# Patient Record
Sex: Female | Born: 1960 | Race: Black or African American | Hispanic: No | Marital: Married | Smoking: Never smoker
Health system: Southern US, Community
[De-identification: ages and names within clinical notes are randomized; demographics above are authoritative.]

## PROBLEM LIST (undated history)

## (undated) DIAGNOSIS — C55 Malignant neoplasm of uterus, part unspecified: Secondary | ICD-10-CM

## (undated) DIAGNOSIS — C801 Malignant (primary) neoplasm, unspecified: Secondary | ICD-10-CM

## (undated) DIAGNOSIS — I82409 Acute embolism and thrombosis of unspecified deep veins of unspecified lower extremity: Secondary | ICD-10-CM

## (undated) DIAGNOSIS — N63 Unspecified lump in unspecified breast: Secondary | ICD-10-CM

## (undated) DIAGNOSIS — R222 Localized swelling, mass and lump, trunk: Secondary | ICD-10-CM

## (undated) HISTORY — PX: BREAST BIOPSY: SHX20

## (undated) HISTORY — DX: Malignant (primary) neoplasm, unspecified: C80.1

## (undated) HISTORY — DX: Unspecified lump in unspecified breast: N63.0

## (undated) HISTORY — PX: COLONOSCOPY: SHX174

---

## 1999-12-30 HISTORY — PX: CHOLECYSTECTOMY: SHX55

## 2003-12-30 HISTORY — PX: BACK SURGERY: SHX140

## 2003-12-30 HISTORY — PX: ANKLE SURGERY: SHX546

## 2004-12-13 ENCOUNTER — Ambulatory Visit (HOSPITAL_COMMUNITY): Admission: RE | Admit: 2004-12-13 | Discharge: 2004-12-14 | Payer: Self-pay | Admitting: Neurological Surgery

## 2004-12-29 DIAGNOSIS — N63 Unspecified lump in unspecified breast: Secondary | ICD-10-CM

## 2004-12-29 HISTORY — DX: Unspecified lump in unspecified breast: N63.0

## 2006-02-06 ENCOUNTER — Ambulatory Visit: Payer: Self-pay | Admitting: Family Medicine

## 2006-11-13 ENCOUNTER — Ambulatory Visit: Payer: Self-pay | Admitting: Unknown Physician Specialty

## 2008-02-23 ENCOUNTER — Ambulatory Visit: Payer: Self-pay | Admitting: Unknown Physician Specialty

## 2008-03-07 ENCOUNTER — Ambulatory Visit: Payer: Self-pay | Admitting: Unknown Physician Specialty

## 2009-05-25 ENCOUNTER — Ambulatory Visit: Payer: Self-pay | Admitting: General Surgery

## 2010-05-30 ENCOUNTER — Ambulatory Visit: Payer: Self-pay | Admitting: Unknown Physician Specialty

## 2011-06-03 ENCOUNTER — Ambulatory Visit: Payer: Self-pay | Admitting: Unknown Physician Specialty

## 2012-06-24 ENCOUNTER — Ambulatory Visit: Payer: Self-pay | Admitting: Obstetrics and Gynecology

## 2013-03-24 ENCOUNTER — Encounter: Payer: Self-pay | Admitting: *Deleted

## 2013-03-29 ENCOUNTER — Other Ambulatory Visit: Payer: Self-pay

## 2013-03-29 ENCOUNTER — Ambulatory Visit (INDEPENDENT_AMBULATORY_CARE_PROVIDER_SITE_OTHER): Payer: 59 | Admitting: General Surgery

## 2013-03-29 ENCOUNTER — Encounter: Payer: Self-pay | Admitting: General Surgery

## 2013-03-29 VITALS — BP 122/80 | HR 78 | Resp 12 | Ht 64.0 in | Wt 197.0 lb

## 2013-03-29 DIAGNOSIS — I10 Essential (primary) hypertension: Secondary | ICD-10-CM

## 2013-03-29 DIAGNOSIS — N63 Unspecified lump in unspecified breast: Secondary | ICD-10-CM

## 2013-03-29 DIAGNOSIS — N6002 Solitary cyst of left breast: Secondary | ICD-10-CM

## 2013-03-29 DIAGNOSIS — N6009 Solitary cyst of unspecified breast: Secondary | ICD-10-CM

## 2013-03-29 NOTE — Progress Notes (Signed)
Patient ID: Cassidy Bradley, female   DOB: 06-Jun-1961, 52 y.o.   MRN: [id number]  Chief Complaint  Patient presents with  . Mass    left breast knot    HPI Cassidy Bradley is a 52 y.o. female here today for a left breast knot noticed in January 2014 . It a little tender to move her  arm.Patient last mammogram was on  06/24/12 cat 2 . Patient have multi family members with breast cancer.Patient had labs on 03/09/13.Patient had biliateral breast biopsy's done in the past. . HPI  Past Medical History  Diagnosis Date  . Breast lump 2006     both breast     Past Surgical History  Procedure Laterality Date  . Cholecystectomy  2001  . Ankle surgery  2005  . Cesarean section  1993  . Back surgery  2005  . Breast biopsy Bilateral   . Colonoscopy      Family History  Problem Relation Age of Onset  . Breast cancer Sister 78  . Breast cancer Maternal Aunt   . Breast cancer Maternal Grandmother 60  . Stomach cancer Maternal Aunt   . Lung cancer Maternal Grandfather     Social History History  Substance Use Topics  . Smoking status: Not on file  . Smokeless tobacco: Never Used  . Alcohol Use: No    Allergies  Allergen Reactions  . Pravastatin Hives  . Sulfa Antibiotics Hives    Current Outpatient Prescriptions  Medication Sig Dispense Refill  . furosemide (LASIX) 20 MG tablet Take 20 mg by mouth as needed.       No current facility-administered medications for this visit.    Review of Systems Review of Systems  Constitutional: Negative.   Respiratory: Negative.   Cardiovascular: Negative.     Blood pressure 122/80, pulse 78, resp. rate 12, height 5\' 4"  (1.626 m), weight 197 lb (89.359 kg).  Physical Exam Physical Exam  Constitutional: She appears well-developed and well-nourished.  Neck: Normal range of motion. Neck supple.  Cardiovascular: Normal rate, regular rhythm and normal heart sounds.   Pulmonary/Chest: Effort normal and breath sounds normal. Right breast  exhibits no inverted nipple, no mass, no nipple discharge, no skin change and no tenderness. Left breast exhibits inverted nipple, mass (2 .5cm mass 4 o'clock 7 cm from the nipple) and tenderness. Left breast exhibits no nipple discharge and no skin change.  Lymphadenopathy:       Right axillary: No pectoral and no lateral adenopathy present.       Left axillary: No pectoral and no lateral adenopathy present.   Data Reviewed Bilateral mammogram dated 06/24/2012 was category 2.  Breast ultrasound showed a large simple cyst at the 4:00 position 7 cm from nipple measuring 2.2 x 2.4 x 3.1 cm. Post aspiration showed complete resolution. The cavity was filled with air to minimize the chance for recurrence. The procedure was well tolerated.  Assessment    Left breast cyst.    Plan    Arrangements were made for followup exam in 6 weeks to confirm the area has not recurred.       Cassidy Bradley 03/30/2013, 5:06 PM

## 2013-03-29 NOTE — Patient Instructions (Addendum)
6 week follow up

## 2013-03-30 ENCOUNTER — Encounter: Payer: Self-pay | Admitting: General Surgery

## 2013-03-30 DIAGNOSIS — I10 Essential (primary) hypertension: Secondary | ICD-10-CM | POA: Insufficient documentation

## 2013-05-16 ENCOUNTER — Ambulatory Visit: Payer: 59 | Admitting: General Surgery

## 2013-06-23 ENCOUNTER — Encounter: Payer: Self-pay | Admitting: General Surgery

## 2013-06-23 ENCOUNTER — Ambulatory Visit (INDEPENDENT_AMBULATORY_CARE_PROVIDER_SITE_OTHER): Payer: 59 | Admitting: General Surgery

## 2013-06-23 VITALS — BP 140/80 | HR 74 | Resp 12 | Ht 64.0 in | Wt 194.0 lb

## 2013-06-23 DIAGNOSIS — N63 Unspecified lump in unspecified breast: Secondary | ICD-10-CM | POA: Insufficient documentation

## 2013-06-23 NOTE — Progress Notes (Signed)
Patient ID: Cassidy Bradley, female   DOB: 1961-07-16, 52 y.o.   MRN: [id number]  Chief Complaint  Patient presents with  . Other    breast cyst    HPI Cassidy Bradley is a 52 y.o. female who presents for a 6 week follow up for left breast cyst. The patient states she is doing well. No complaints at this time. The patient underwent aspiration of a symptomatic cyst with air insufflation. She reports the pain she was experiencing prior to aspiration as resolved and has not recurred. She is no longer were very palpable masses in the breast. HPI  Past Medical History  Diagnosis Date  . Breast lump 2006     both breast     Past Surgical History  Procedure Laterality Date  . Cholecystectomy  2001  . Ankle surgery  2005  . Cesarean section  1993  . Back surgery  2005  . Breast biopsy Bilateral   . Colonoscopy      Family History  Problem Relation Age of Onset  . Breast cancer Sister 7  . Breast cancer Maternal Aunt   . Breast cancer Maternal Grandmother 60  . Stomach cancer Maternal Aunt   . Lung cancer Maternal Grandfather     Social History History  Substance Use Topics  . Smoking status: Never Smoker   . Smokeless tobacco: Never Used  . Alcohol Use: No    Allergies  Allergen Reactions  . Pravastatin Hives  . Sulfa Antibiotics Hives  . Latex Rash    Current Outpatient Prescriptions  Medication Sig Dispense Refill  . furosemide (LASIX) 20 MG tablet Take 20 mg by mouth as needed.       No current facility-administered medications for this visit.    Review of Systems Review of Systems  Constitutional: Negative.   Respiratory: Negative.   Cardiovascular: Negative.     Blood pressure 140/80, pulse 74, resp. rate 12, height 5\' 4"  (1.626 m), weight 194 lb (87.998 kg).  Physical Exam Physical Exam  Constitutional: She appears well-developed and well-nourished.  Pulmonary/Chest: Left breast exhibits no inverted nipple, no mass, no nipple discharge, no skin  change and no tenderness. Breasts are symmetrical.    No focal mass, thickening or tenderness identified in either breast.  Neurological: She is alert.  Skin: Skin is warm and dry.    Data Reviewed No new imaging studies.  Assessment    Benign exam.     Plan    The patient was encouraged to continue annual mammograms with her primary care provider. She was encouraged to call if she develops any recurrent breast lesions or develops any recurrent breast pain.        Earline Mayotte 06/23/2013, 8:33 PM

## 2013-06-23 NOTE — Patient Instructions (Signed)
Patient to return as needed. Patient advised to have PCP to send results of her mammogram to our office.

## 2013-06-28 ENCOUNTER — Ambulatory Visit: Payer: Self-pay | Admitting: Physician Assistant

## 2013-06-29 ENCOUNTER — Telehealth: Payer: Self-pay

## 2013-06-29 NOTE — Telephone Encounter (Signed)
Notified patient of mammogram results and Dr Rutherford Nail advise to follow up with PCP annually. Patient thankful and understands

## 2013-06-29 NOTE — Telephone Encounter (Signed)
Message copied by Cassidy Bradley on Wed Jun 29, 2013 11:35 AM ------      Message from: [place], Merrily Pew      Created: Wed Jun 29, 2013 10:50 AM      Regarding: RE: Mammogram       Please notify the patient and I looked at her mammograms. She has a small cyst in the left breast, nothing like she had before. Otherwise unremarkable. Annual exams with her primary care provider      ----- Message -----         From: Cassidy Du, LPN         Sent: 06/29/2013   8:51 AM           To: Earline Mayotte, MD      Subject: Mammogram                                                Trystyn called today to let you know that she had her mammogram done yesterday at [facility]. You had asked her to call when this was completed so you could review the films.       ------

## 2014-05-08 ENCOUNTER — Ambulatory Visit: Payer: 59 | Admitting: General Surgery

## 2014-06-21 ENCOUNTER — Encounter: Payer: Self-pay | Admitting: General Surgery

## 2014-07-11 ENCOUNTER — Ambulatory Visit: Payer: Self-pay | Admitting: Physician Assistant

## 2014-07-12 ENCOUNTER — Ambulatory Visit: Payer: Self-pay | Admitting: General Surgery

## 2014-08-22 ENCOUNTER — Encounter: Payer: Self-pay | Admitting: General Surgery

## 2014-08-23 ENCOUNTER — Ambulatory Visit (INDEPENDENT_AMBULATORY_CARE_PROVIDER_SITE_OTHER): Payer: 59 | Admitting: General Surgery

## 2014-08-23 ENCOUNTER — Encounter: Payer: Self-pay | Admitting: General Surgery

## 2014-08-23 VITALS — BP 120/78 | HR 76 | Resp 12 | Ht 64.0 in | Wt 184.0 lb

## 2014-08-23 DIAGNOSIS — E7800 Pure hypercholesterolemia, unspecified: Secondary | ICD-10-CM | POA: Insufficient documentation

## 2014-08-23 DIAGNOSIS — N951 Menopausal and female climacteric states: Secondary | ICD-10-CM | POA: Insufficient documentation

## 2014-08-23 DIAGNOSIS — J309 Allergic rhinitis, unspecified: Secondary | ICD-10-CM | POA: Insufficient documentation

## 2014-08-23 DIAGNOSIS — G43909 Migraine, unspecified, not intractable, without status migrainosus: Secondary | ICD-10-CM | POA: Insufficient documentation

## 2014-08-23 DIAGNOSIS — Z1211 Encounter for screening for malignant neoplasm of colon: Secondary | ICD-10-CM

## 2014-08-23 MED ORDER — POLYETHYLENE GLYCOL 3350 17 GM/SCOOP PO POWD: ORAL | Status: DC

## 2014-08-23 NOTE — Patient Instructions (Addendum)
Colonoscopy A colonoscopy is an exam to look at the entire large intestine (colon). This exam can help find problems such as tumors, polyps, inflammation, and areas of bleeding. The exam takes about 1 hour.  LET [facility] CARE PROVIDER KNOW ABOUT:   Any allergies you have.  All medicines you are taking, including vitamins, herbs, eye drops, creams, and over-the-counter medicines.  Previous problems you or members of your family have had with the use of anesthetics.  Any blood disorders you have.  Previous surgeries you have had.  Medical conditions you have. RISKS AND COMPLICATIONS  Generally, this is a safe procedure. However, as with any procedure, complications can occur. Possible complications include:  Bleeding.  Tearing or rupture of the colon wall.  Reaction to medicines given during the exam.  Infection (rare). BEFORE THE PROCEDURE   Ask your health care provider about changing or stopping your regular medicines.  You may be prescribed an oral bowel prep. This involves drinking a large amount of medicated liquid, starting the day before your procedure. The liquid will cause you to have multiple loose stools until your stool is almost clear or light green. This cleans out your colon in preparation for the procedure.  Do not eat or drink anything else once you have started the bowel prep, unless your health care provider tells you it is safe to do so.  Arrange for someone to drive you home after the procedure. PROCEDURE   You will be given medicine to help you relax (sedative).  You will lie on your side with your knees bent.  A long, flexible tube with a light and camera on the end (colonoscope) will be inserted through the rectum and into the colon. The camera sends video back to a computer screen as it moves through the colon. The colonoscope also releases carbon dioxide gas to inflate the colon. This helps your health care provider see the area better.  During  the exam, your health care provider may take a small tissue sample (biopsy) to be examined under a microscope if any abnormalities are found.  The exam is finished when the entire colon has been viewed. AFTER THE PROCEDURE   Do not drive for 24 hours after the exam.  You may have a small amount of blood in your stool.  You may pass moderate amounts of gas and have mild abdominal cramping or bloating. This is caused by the gas used to inflate your colon during the exam.  Ask when your test results will be ready and how you will get your results. Make sure you get your test results. Document Released: 12/12/2000 Document Revised: 10/05/2013 Document Reviewed: 08/22/2013 [facility] Patient Information 2015 [place], [place]. This information is not intended to replace advice given to you by your health care provider. Make sure you discuss any questions you have with your health care provider.  Patient has been scheduled for a colonoscopy on 10-18-14 at [facility].

## 2014-08-23 NOTE — Progress Notes (Signed)
Patient ID: Cassidy Bradley, female   DOB: 1961-03-11, 53 y.o.   MRN: [id number]  Chief Complaint  Patient presents with  . Other    screening colonoscopy    HPI Cassidy Bradley is a 53 y.o. female here today for a screening colonoscopy.Patient last colonoscopy was done in 2005. Patient states no gastrointestional problems at this time. She does admit to occasional trouble with hemorrhoids, but no bleeding. Bowels are regular.  HPI  Past Medical History  Diagnosis Date  . Breast lump 2006     both breast     Past Surgical History  Procedure Laterality Date  . Cholecystectomy  2001  . Ankle surgery  2005  . Cesarean section  1993  . Back surgery  2005  . Breast biopsy Bilateral   . Colonoscopy      Family History  Problem Relation Age of Onset  . Breast cancer Sister 12  . Breast cancer Maternal Aunt   . Breast cancer Maternal Grandmother 60  . Stomach cancer Maternal Aunt   . Lung cancer Maternal Grandfather   . Colon polyps Mother   . Cancer - Colon Cousin 78    first cousin    Social History History  Substance Use Topics  . Smoking status: Never Smoker   . Smokeless tobacco: Never Used  . Alcohol Use: No    Allergies  Allergen Reactions  . Pravastatin Hives and Nausea And Vomiting  . Sulfa Antibiotics Hives  . Latex Rash  . Other Rash    Spandex    Current Outpatient Prescriptions  Medication Sig Dispense Refill  . cholecalciferol (VITAMIN D) 1000 UNITS tablet Take 1,000 Units by mouth daily.      . polyethylene glycol powder (GLYCOLAX/MIRALAX) powder 255 grams one bottle for colonoscopy prep  255 g  0   No current facility-administered medications for this visit.    Review of Systems Review of Systems  Constitutional: Negative.   Respiratory: Negative.   Cardiovascular: Negative.     Blood pressure 120/78, pulse 76, resp. rate 12, height 5\' 4"  (1.626 m), weight 184 lb (83.462 kg).  Physical Exam Physical Exam  Constitutional: She is  oriented to person, place, and time. She appears well-developed and well-nourished.  Neck: Neck supple.  Cardiovascular: Normal rate, regular rhythm and normal heart sounds.   Pulmonary/Chest: Effort normal and breath sounds normal.  Abdominal: Soft. Bowel sounds are normal. There is no tenderness.  Lymphadenopathy:    She has no cervical adenopathy.  Neurological: She is alert and oriented to person, place, and time.  Skin: Skin is warm and dry.    Data Reviewed January 07, 2014 colonoscopy was normal. Exam completed for occult blood in the stool.  Assessment    Candidate for screening colonoscopy.     Plan      Colonoscopy with possible biopsy/polypectomy prn: Information regarding the procedure, including its potential risks and complications (including but not limited to perforation of the bowel, which may require emergency surgery to repair, and bleeding) was verbally given to the patient. Educational information regarding lower instestinal endoscopy was given to the patient. Written instructions for how to complete the bowel prep using Miralax were provided. The importance of drinking ample fluids to avoid dehydration as a result of the prep emphasized.  Patient has been scheduled for a colonoscopy on 10-18-14 at [facility].    PCP: Dagoberto Reef 08/25/2014, 6:42 PM

## 2014-08-25 DIAGNOSIS — Z1211 Encounter for screening for malignant neoplasm of colon: Secondary | ICD-10-CM | POA: Insufficient documentation

## 2014-10-09 ENCOUNTER — Telehealth: Payer: Self-pay | Admitting: *Deleted

## 2014-10-09 NOTE — Telephone Encounter (Signed)
Patient states she has had no medication changes since last office visit. She does have Miralax prescription but has yet to pre-register. Patient encouraged to pre-register by Friday of this week.   We will proceed with colonoscopy as scheduled for 10-18-14 at [facility].   This patient was instructed to call the office should she have further questions.

## 2014-10-15 ENCOUNTER — Other Ambulatory Visit: Payer: Self-pay | Admitting: General Surgery

## 2014-10-15 DIAGNOSIS — Z1211 Encounter for screening for malignant neoplasm of colon: Secondary | ICD-10-CM

## 2014-10-18 ENCOUNTER — Ambulatory Visit: Payer: Self-pay | Admitting: General Surgery

## 2014-10-18 DIAGNOSIS — Z1211 Encounter for screening for malignant neoplasm of colon: Secondary | ICD-10-CM

## 2014-10-19 ENCOUNTER — Encounter: Payer: Self-pay | Admitting: General Surgery

## 2014-12-08 ENCOUNTER — Ambulatory Visit: Payer: Self-pay | Admitting: Physician Assistant

## 2014-12-18 ENCOUNTER — Ambulatory Visit: Payer: Self-pay | Admitting: Physician Assistant

## 2015-01-04 ENCOUNTER — Ambulatory Visit: Payer: Self-pay

## 2015-01-24 ENCOUNTER — Ambulatory Visit: Payer: Self-pay | Admitting: Obstetrics and Gynecology

## 2015-01-29 ENCOUNTER — Ambulatory Visit: Payer: Self-pay | Admitting: Obstetrics and Gynecology

## 2015-01-29 DIAGNOSIS — C55 Malignant neoplasm of uterus, part unspecified: Secondary | ICD-10-CM

## 2015-01-29 HISTORY — DX: Malignant neoplasm of uterus, part unspecified: C55

## 2015-01-30 ENCOUNTER — Inpatient Hospital Stay: Payer: Self-pay | Admitting: Obstetrics and Gynecology

## 2015-01-30 HISTORY — PX: ABDOMINAL HYSTERECTOMY: SHX81

## 2015-01-31 LAB — BASIC METABOLIC PANEL
Anion Gap: 10 (ref 7–16)
BUN: 4 mg/dL — ABNORMAL LOW (ref 7–18)
CALCIUM: 9.1 mg/dL (ref 8.5–10.1)
Chloride: 100 mmol/L (ref 98–107)
Co2: 26 mmol/L (ref 21–32)
Creatinine: 0.62 mg/dL (ref 0.60–1.30)
EGFR (African American): >60
EGFR (Non-African Amer.): >60
Glucose: 143 mg/dL — ABNORMAL HIGH (ref 65–99)
Osmolality: 271 (ref 275–301)
POTASSIUM: 3.3 mmol/L — AB (ref 3.5–5.1)
Sodium: 136 mmol/L (ref 136–145)

## 2015-01-31 LAB — CBC
HCT: 34.8 % — ABNORMAL LOW (ref 35.0–47.0)
HGB: 11.8 g/dL — AB (ref 12.0–16.0)
MCH: 29.2 pg (ref 26.0–34.0)
MCHC: 34 g/dL (ref 32.0–36.0)
MCV: 86 fL (ref 80–100)
Platelet: 227 10*3/uL (ref 150–440)
RBC: 4.05 10*6/uL (ref 3.80–5.20)
RDW: 13.5 % (ref 11.5–14.5)
WBC: 7.6 10*3/uL (ref 3.6–11.0)

## 2015-01-31 LAB — MAGNESIUM: Magnesium: 1.7 mg/dL — ABNORMAL LOW

## 2015-02-01 LAB — BASIC METABOLIC PANEL
Anion Gap: 6 — ABNORMAL LOW (ref 7–16)
BUN: 7 mg/dL (ref 7–18)
CALCIUM: 9.4 mg/dL (ref 8.5–10.1)
CREATININE: 0.74 mg/dL (ref 0.60–1.30)
Chloride: 104 mmol/L (ref 98–107)
Co2: 29 mmol/L (ref 21–32)
EGFR (Non-African Amer.): >60
Glucose: 83 mg/dL (ref 65–99)
Osmolality: 275 (ref 275–301)
POTASSIUM: 3.8 mmol/L (ref 3.5–5.1)
Sodium: 139 mmol/L (ref 136–145)

## 2015-02-01 LAB — CBC
HCT: 34 % — ABNORMAL LOW (ref 35.0–47.0)
HGB: 11.3 g/dL — ABNORMAL LOW (ref 12.0–16.0)
MCH: 28.9 pg (ref 26.0–34.0)
MCHC: 33.2 g/dL (ref 32.0–36.0)
MCV: 87 fL (ref 80–100)
PLATELETS: 212 10*3/uL (ref 150–440)
RBC: 3.9 10*6/uL (ref 3.80–5.20)
RDW: 13.3 % (ref 11.5–14.5)
WBC: 4.7 10*3/uL (ref 3.6–11.0)

## 2015-02-01 LAB — MAGNESIUM: MAGNESIUM: 2 mg/dL

## 2015-02-12 DIAGNOSIS — C7800 Secondary malignant neoplasm of unspecified lung: Secondary | ICD-10-CM | POA: Insufficient documentation

## 2015-02-19 ENCOUNTER — Emergency Department: Payer: Self-pay | Admitting: Emergency Medicine

## 2015-02-21 ENCOUNTER — Ambulatory Visit: Payer: Self-pay | Admitting: Obstetrics and Gynecology

## 2015-02-27 ENCOUNTER — Ambulatory Visit
Admit: 2015-02-27 | Disposition: A | Discharge status: Home / Self Care | Payer: Self-pay | Attending: Hematology and Oncology | Admitting: Hematology and Oncology

## 2015-03-02 ENCOUNTER — Ambulatory Visit (INDEPENDENT_AMBULATORY_CARE_PROVIDER_SITE_OTHER): Payer: 59 | Admitting: General Surgery

## 2015-03-02 ENCOUNTER — Other Ambulatory Visit: Payer: Self-pay | Admitting: General Surgery

## 2015-03-02 ENCOUNTER — Encounter: Payer: Self-pay | Admitting: General Surgery

## 2015-03-02 VITALS — BP 126/72 | HR 74 | Resp 12 | Ht 64.0 in | Wt 172.0 lb

## 2015-03-02 DIAGNOSIS — C55 Malignant neoplasm of uterus, part unspecified: Secondary | ICD-10-CM | POA: Diagnosis not present

## 2015-03-02 NOTE — Progress Notes (Signed)
Patient ID: Cassidy Bradley, female   DOB: 1961/04/22, 54 y.o.   MRN: [id number]  Chief Complaint  Patient presents with  . Other    eval port placement    HPI Cassidy Bradley is a 54 y.o. female here today for a evaluation of a port placement. Patient had a chest wall biopsy after a CT scan identified multiple pulmonary as well as to internal mammary chain nodules. last week. Metastatic disease identified on CT guided biopsy. The patient is accompanied by her husband, Dellis Filbert. HPI  Past Medical History  Diagnosis Date  . Breast lump 2006     both breast   . Cancer     Past Surgical History  Procedure Laterality Date  . Cholecystectomy  2001  . Ankle surgery  2005  . Cesarean section  1993  . Back surgery  2005  . Breast biopsy Bilateral   . Colonoscopy    . Abdominal hysterectomy  01/30/15    Family History  Problem Relation Age of Onset  . Breast cancer Sister 20  . Breast cancer Maternal Aunt   . Breast cancer Maternal Grandmother 60  . Stomach cancer Maternal Aunt   . Lung cancer Maternal Grandfather   . Colon polyps Mother   . Cancer - Colon Cousin 58    first cousin    Social History History  Substance Use Topics  . Smoking status: Never Smoker   . Smokeless tobacco: Never Used  . Alcohol Use: No    Allergies  Allergen Reactions  . Pravastatin Hives and Nausea And Vomiting  . Sulfa Antibiotics Hives  . Latex Rash  . Other Rash    Spandex    Current Outpatient Prescriptions  Medication Sig Dispense Refill  . cholecalciferol (VITAMIN D) 1000 UNITS tablet Take 1,000 Units by mouth daily.    . polyethylene glycol powder (GLYCOLAX/MIRALAX) powder 255 grams one bottle for colonoscopy prep 255 g 0  . oxyCODONE-acetaminophen (PERCOCET/ROXICET) 5-325 MG per tablet Take 1 tablet by mouth every 6 (six) hours as needed for severe pain.     No current facility-administered medications for this visit.    Review of Systems Review of Systems  Constitutional:  Negative.   Respiratory: Negative.   Cardiovascular: Negative.     Blood pressure 126/72, pulse 74, resp. rate 12, height 5\' 4"  (1.626 m), weight 172 lb (78.019 kg).  Physical Exam Physical Exam  Constitutional: She is oriented to person, place, and time. She appears well-developed and well-nourished.  Eyes: Conjunctivae are normal. No scleral icterus.  Neck: Neck supple.  Cardiovascular: Normal rate, regular rhythm and normal heart sounds.   Pulmonary/Chest: Effort normal and breath sounds normal.  Lymphadenopathy:    She has no cervical adenopathy.  Neurological: She is alert and oriented to person, place, and time.  Skin: Skin is warm and dry.    Data Reviewed  Pathology from the chest CT guided biopsy of 02/21/2015 shows a malignant neoplasm consistent with metastatic leiomyosarcoma.   Assessment     Metastatic leiomyosarcoma, candidate for adjuvant chemotherapy.     Plan     the risks and benefits of port placement were reviewed. Possibility of pneumothorax discussed.     Patient is scheduled for a port placement at [facility] on 03/05/15. She will arrive at [place] on 03/05/15 at 7:30 am. She is aware of date, time, and all instructions.   PCP:  Dreama Saa 03/02/2015, 11:01 AM

## 2015-03-02 NOTE — Patient Instructions (Addendum)
Implanted Port Insertion An implanted port is a central line that has a round shape and is placed under the skin. It is used as a long-term IV access for:   Medicines, such as chemotherapy.   Fluids.   Liquid nutrition, such as total parenteral nutrition (TPN).   Blood samples.  LET YOUR HEALTH CARE PROVIDER KNOW ABOUT:  Allergies to food or medicine.   Medicines taken, including vitamins, herbs, eye drops, creams, and over-the-counter medicines.   Any allergies to heparin.  Use of steroids (by mouth or creams).   Previous problems with anesthetics or numbing medicines.   History of bleeding problems or blood clots.   Previous surgery.   Other health problems, including diabetes and kidney problems.   Possibility of pregnancy, if this applies. RISKS AND COMPLICATIONS Generally, this is a safe procedure. However, as with any procedure, problems can occur. Possible problems include:  Damage to the blood vessel, bruising, or bleeding at the puncture site.   Infection.  Blood clot in the vessel that the port is in.  Breakdown of the skin over your port.  Very rarely a person may develop a condition called a pneumothorax, a collection of air in the chest that may cause one of the lungs to collapse. The placement of these catheters with the appropriate imaging guidance significantly decreases the risk of a pneumothorax.  BEFORE THE PROCEDURE   Your health care provider may want you to have blood tests. These tests can help tell how well your kidneys and liver are working. They can also show how well your blood clots.   If you take blood thinners (anticoagulant medicines), ask your health care provider when you should stop taking them.   Make arrangements for someone to drive you home. This is necessary if you have been sedated for your procedure.  PROCEDURE  Port insertion usually takes about 30-45 minutes.   An IV needle will be inserted in your arm.  Medicine for pain and medicine to help relax you (sedative) will flow directly into your body through this needle.   You will lie on an exam table, and you will be connected to monitors to keep track of your heart rate, blood pressure, and breathing throughout the procedure.  An oxygen monitoring device may be attached to your finger. Oxygen will be given.   Everything will be kept as germ free (sterile) as possible during the procedure. The skin near the point of the incision will be cleansed with antiseptic, and the area will be draped with sterile towels. The skin and deeper tissues over the port area will be made numb with a local anesthetic.  Two small cuts (incisions) will be made in the skin to insert the port. One will be made in the neck to obtain access to the vein where the catheter will lie.   Because the port reservoir will be placed under the skin, a small skin incision will be made in the upper chest, and a small pocket for the port will be made under the skin. The catheter that will be connected to the port tunnels to a large central vein in the chest. A small, raised area will remain on your body at the site of the reservoir when the procedure is complete.  The port placement will be done under imaging guidance to ensure the proper placement.  The reservoir has a silicone covering that can be punctured with a special needle.   The port will be flushed with normal   saline, and blood will be drawn to make sure it is working properly.  There will be nothing remaining outside the skin when the procedure is finished.   Incisions will be held together by stitches, surgical glue, or a special tape. AFTER THE PROCEDURE  You will stay in a recovery area until the anesthesia has worn off. Your blood pressure and pulse will be checked.  A final chest X-ray will be taken to check the placement of the port and to ensure that there is no injury to your lung. Document Released:  10/05/2013 Document Revised: 05/01/2014 Document Reviewed: 10/05/2013 [facility] Patient Information 2015 [place], [place]. This information is not intended to replace advice given to you by your health care provider. Make sure you discuss any questions you have with your health care provider.  Patient is scheduled for a port placement at [facility] on 03/05/15. She will arrive at [place] on 03/05/15 at 7:30 am. She is aware of date, time, and all instructions.

## 2015-03-05 ENCOUNTER — Ambulatory Visit: Payer: Self-pay | Admitting: General Surgery

## 2015-03-05 ENCOUNTER — Encounter: Payer: Self-pay | Admitting: General Surgery

## 2015-03-05 DIAGNOSIS — C55 Malignant neoplasm of uterus, part unspecified: Secondary | ICD-10-CM | POA: Diagnosis not present

## 2015-03-06 ENCOUNTER — Ambulatory Visit: Payer: Self-pay | Admitting: General Surgery

## 2015-03-06 ENCOUNTER — Encounter: Payer: Self-pay | Admitting: General Surgery

## 2015-03-08 ENCOUNTER — Ambulatory Visit: Payer: Self-pay | Admitting: Hematology and Oncology

## 2015-03-19 LAB — CREATININE, SERUM: Creatine, Serum: 0.63

## 2015-03-30 ENCOUNTER — Ambulatory Visit
Admit: 2015-03-30 | Disposition: A | Discharge status: Home / Self Care | Payer: Self-pay | Attending: Hematology and Oncology | Admitting: Hematology and Oncology

## 2015-04-16 ENCOUNTER — Encounter: Payer: Self-pay | Admitting: Hematology and Oncology

## 2015-04-18 LAB — CREATININE, SERUM: Creatine, Serum: 0.63

## 2015-04-23 ENCOUNTER — Encounter: Payer: Self-pay | Admitting: Hematology and Oncology

## 2015-04-23 LAB — SURGICAL PATHOLOGY

## 2015-04-25 ENCOUNTER — Other Ambulatory Visit: Payer: Self-pay | Admitting: Hematology and Oncology

## 2015-04-29 NOTE — Op Note (Signed)
PATIENT NAME:  Cassidy Bradley, Cassidy Bradley MR#:  [id number] DATE OF BIRTH:  Jan 20, 1961  DATE OF PROCEDURE:  03/05/2015  PREOPERATIVE DIAGNOSIS:  Metastatic leiomyosarcoma, need for central venous access.   POSTOPERATIVE DIAGNOSIS:  Metastatic leiomyosarcoma, need for central venous access.   OPERATIVE PROCEDURE: Left subclavian PowerPort placement with ultrasound and fluoroscopic guidance.   OPERATING SURGEON: Hervey Ard, MD.  .   ANESTHESIA: Attended local, 10 mL of 1% plain Xylocaine.   ESTIMATED BLOOD LOSS: Minimal.   CLINICAL NOTE: This woman recently underwent a hysterectomy and was identified with a leiomyosarcoma. Metastatic workup showed evidence of multiple pulmonary nodules as well as a suspected intramammary node biopsy positive for metastatic disease. Central venous access has been requested for chemotherapy.   OPERATIVE NOTE: With the patient comfortably supine on the operating table, the area of the left chest and neck was prepped with ChloraPrep and draped. Ultrasound was used to confirm patency of the subclavian vein and guide needle insertion. The guidewire was advanced, followed by the dilator and catheter. This was positioned down at the SVC right atrial junction. It was tunneled to a pocket on the left anterior chest. The port was transfixed with 2 sutures of number 3-0 Prolene. Initially it did not aspirate well and the catheter lead was shortened by 2 cm and it appeared to be much more reliable. It easily irrigated. The catheter was flushed with 10 mL of injectable saline at the end of the procedure. The wound was closed with 3-0 Vicryl to the adipose layer and a running 4-0 Vicryl subcuticular suture for the skin at both sites. An erect portable chest x-ray in the recovery room did not show evidence of pneumothorax. The catheter tip appears to be within the right atrium on my review.    ____________________________ Robert Bellow, MD [email or web address] D: 03/05/2015 16:44:00  ET T: 03/05/2015 19:32:10 ET JOB#: [id number]  cc: Robert Bellow, MD, <Dictator> Melissa C. Mike Gip, MD Lara Palinkas Amedeo Kinsman MD ELECTRONICALLY SIGNED 03/05/2015 20:31

## 2015-04-29 NOTE — Op Note (Signed)
PATIENT NAME:  Cassidy Bradley, Cassidy Bradley MR#:  [id number] DATE OF BIRTH:  05-Mar-1961  DATE OF PROCEDURE:  01/30/2015  PREOPERATIVE DIAGNOSES:  1.  Postmenopausal bleeding.  2.  Fibroid uterus.   POSTOPERATIVE DIAGNOSES:  1.  Postmenopausal bleeding.  2.  Fibroid uterus.   PROCEDURE:   1.  Total abdominal hysterectomy.  2.  Bilateral salpingo-oophorectomy.   ANESTHESIA: General.   SURGEON:  Prentice Docker, M.D.   ASSISTANT SURGEON:  Barnett Applebaum, M.D.   ESTIMATED BLOOD LOSS: 100 mL.    OPERATIVE FLUIDS: 6,378 mL  COMPLICATIONS: None.   FINDINGS:  Large (16 week) fibroid uterus with multiple fibroids.   SPECIMENS: Uterus and cervix, right tube and ovary, left tube and ovary.  CONDITION:  At the end of procedure is stable.   PROCEDURE IN DETAIL: The patient was taken to the Operating Room where general  anesthesia was administered and found to be adequate. The patient was placed in the dorsal supine lithotomy position in [place] stirrups and prepped and draped in the usual sterile fashion. After a timeout was called, indwelling Foley catheter was placed in her bladder.   Attention was turned to her abdomen where a vertical midline incision was made from the level of the pubic symphysis up to approximately 4 cm below the level of the umbilicus. The fascia was identified and entered sharply and then the fascia was opened in a caudad direction to nearly the level of the pubic symphysis and then in a cephalad direction up to the level of the skin incision. The underlying tissue was dissected until the peritoneum was identified and entered sharply. The peritoneal opening was extended along after finding the midline and was extended the full length of the skin incision. The uterus was able to be elevated through the opening created and the left round ligament was then grasped and suture ligated in two places and then with electrocautery the broad ligament was opened after transection of the round  ligament. A posterior direction was taken with opening of the broad ligament, and pedicle a was created with the posterior broad ligament that would include the IP. Prior to clamping the IP ligament there was assurance that the ureter was well away from the area and then the IP ligament was then double clamped with Heaney clamps and transected, and then double suture ligated using a free-tie passer as well as a Heaney stitch for hemostasis. The same was done on the uterus side of the vessels. The anterior broad ligament was opened to the level of the internal os and a bladder flap was created. The same procedure was carried out on the right side with assurance that the ureter was well away from the IP ligament. The broad ligament was opened up to completely anteriorly and the uterine arteries were skeletonized and then clamped using Heaney clamps and initially curved, then straight in a serial fashion using a clamp, cut, and suture technique to the level of external os. The bladder was well away and the colpotomy was made at the level of the external os.  The entire cervix and uterus were liberated from the vagina. The vaginal cuff was sewn closed using figure-of-eight with #0 Vicryl. The uterus and cervix along with fallopian tubes and ovaries were separated.  At this point, the small remaining opening of the vagina was then closed using figure-of-eight sutures. Irrigation was undertaken to ensure hemostasis, which was verified. At this point, the hysterectomy portion of the procedure was terminated in favor of  closure.   All sponges were removed from the abdomen. Of note, no sponges were used to pack away bowel during the procedure, the uterus was used to serve as a retractor during most of the portion of the procedure along with malleable retractor.   The fascia was then closed using looped 1-0 PDS starting from the superior aspect and going approximately half the length of the opening. The fascia was closed  starting at the inferior aspect using looped 1-0 PDS where the 2 PDS's met halfway along the fascial opening. They were tied together with the 2 ends tied separately.  The subcutaneous tissue was then copiously irrigated and found to be hemostatic. The subcutaneous tissue was reapproximated using a #2-0 plain gut such that no greater than 2 cm of dead space remained. The skin was closed with 4-0 Monocryl in subcuticular fashion. The skin closure was reinforced using Dermabond.   The patient tolerated the procedure well.  Sponge, lap and needle counts were correct x2. For antibiotic prophylaxis, the patient received Ancef 2 grams IV prior to a skin incision. For VTE prophylaxis, she was wearing pneumatic compression stockings throughout the entire procedure.   The vagina was checked at the end of procedure to verify no instrumentation remained. The patient was awakened in the Operating Room and taken to recovery in stable condition.    ____________________________ Will Bonnet, MD sdj:at D: 01/30/2015 12:13:15 ET T: 01/30/2015 13:07:37 ET JOB#: [id number]  cc: Will Bonnet, MD, <Dictator> Will Bonnet MD ELECTRONICALLY SIGNED 02/21/2015 8:05

## 2015-04-29 NOTE — Op Note (Signed)
PATIENT NAME:  Cassidy Bradley, Cassidy Bradley MR#:  [id number] DATE OF BIRTH:  1961-03-15  DATE OF PROCEDURE:  03/05/2015  PREOPERATIVE DIAGNOSIS:  Metastatic leiomyosarcoma, need for central venous access.   POSTOPERATIVE DIAGNOSIS:  Metastatic leiomyosarcoma, need for central venous access.   OPERATIVE PROCEDURE: Left subclavian PowerPort placement with ultrasound and fluoroscopic guidance.   OPERATING SURGEON: Hervey Ard, MD.  .   ANESTHESIA: Attended local, 10 mL of 1% plain Xylocaine.   ESTIMATED BLOOD LOSS: Minimal.   CLINICAL NOTE: This woman recently underwent a hysterectomy and was identified with a leiomyosarcoma. Metastatic workup showed evidence of multiple pulmonary nodules as well as a suspected intramammary node biopsy positive for metastatic disease. Central venous access has been requested for chemotherapy.   OPERATIVE NOTE: With the patient comfortably supine on the operating table, the area of the left chest and neck was prepped with ChloraPrep and draped. Ultrasound was used to confirm patency of the subclavian vein and guide needle insertion. The guidewire was advanced, followed by the dilator and catheter. This was positioned down at the SVC right atrial junction. It was tunneled to a pocket on the left anterior chest. The port was transfixed with 2 sutures of number 3-0 Prolene. Initially it did not aspirate well and the catheter lead was shortened by 2 cm and it appeared to be much more reliable. It easily irrigated. The catheter was flushed with 10 mL of injectable saline at the end of the procedure. The wound was closed with 3-0 Vicryl to the adipose layer and a running 4-0 Vicryl subcuticular suture for the skin at both sites. An erect portable chest x-ray in the recovery room did not show evidence of pneumothorax. The catheter tip appears to be within the right atrium on my review.    ____________________________ Robert Bellow, MD jwb:bu D: 03/05/2015 16:44:00  ET T: 03/05/2015 19:32:10 ET JOB#: [id number]  cc: Robert Bellow, MD, <Dictator> Melissa C. Mike Gip, MD JEFFREY Amedeo Kinsman MD ELECTRONICALLY SIGNED 03/05/2015 20:31

## 2015-05-01 ENCOUNTER — Other Ambulatory Visit: Payer: Self-pay

## 2015-05-01 ENCOUNTER — Inpatient Hospital Stay: Payer: 59

## 2015-05-01 ENCOUNTER — Encounter: Payer: Self-pay | Admitting: Hematology and Oncology

## 2015-05-01 ENCOUNTER — Inpatient Hospital Stay: Payer: 59 | Attending: Hematology and Oncology | Admitting: Hematology and Oncology

## 2015-05-01 VITALS — BP 126/87 | HR 96 | Temp 97.9°F | Ht 64.0 in | Wt 171.5 lb

## 2015-05-01 DIAGNOSIS — Z5111 Encounter for antineoplastic chemotherapy: Secondary | ICD-10-CM | POA: Insufficient documentation

## 2015-05-01 DIAGNOSIS — Z803 Family history of malignant neoplasm of breast: Secondary | ICD-10-CM | POA: Insufficient documentation

## 2015-05-01 DIAGNOSIS — Z90710 Acquired absence of both cervix and uterus: Secondary | ICD-10-CM

## 2015-05-01 DIAGNOSIS — Z800 Family history of malignant neoplasm of digestive organs: Secondary | ICD-10-CM | POA: Diagnosis not present

## 2015-05-01 DIAGNOSIS — Z79899 Other long term (current) drug therapy: Secondary | ICD-10-CM | POA: Insufficient documentation

## 2015-05-01 DIAGNOSIS — C55 Malignant neoplasm of uterus, part unspecified: Secondary | ICD-10-CM | POA: Insufficient documentation

## 2015-05-01 MED ORDER — SODIUM CHLORIDE 0.9 % IV SOLN
Freq: Once | INTRAVENOUS | Status: AC
  Administered 2015-05-01: 16:00:00 via INTRAVENOUS
  Filled 2015-05-01: qty 4

## 2015-05-01 MED ORDER — GEMCITABINE HCL CHEMO INJECTION 1 GM/26.3ML
900.0000 mg/m2 | Freq: Once | INTRAVENOUS | Status: AC
  Administered 2015-05-01: 1672 mg via INTRAVENOUS
  Filled 2015-05-01: qty 38.71

## 2015-05-01 MED ORDER — LIDOCAINE-PRILOCAINE 2.5-2.5 % EX CREA: TOPICAL_CREAM | Freq: Once | CUTANEOUS | Status: DC

## 2015-05-01 MED ORDER — HEPARIN SOD (PORK) LOCK FLUSH 100 UNIT/ML IV SOLN
500.0000 [IU] | Freq: Once | INTRAVENOUS | Status: AC | PRN
  Filled 2015-05-01: qty 5

## 2015-05-01 MED ORDER — SODIUM CHLORIDE 0.9 % IJ SOLN
10.0000 mL | INTRAMUSCULAR | Status: AC | PRN
  Filled 2015-05-01: qty 10

## 2015-05-01 MED ORDER — LIDOCAINE-PRILOCAINE 2.5-2.5 % EX CREA: 1.0000 "application " | TOPICAL_CREAM | CUTANEOUS | Status: AC | PRN

## 2015-05-01 MED ORDER — SODIUM CHLORIDE 0.9 % IV SOLN
Freq: Once | INTRAVENOUS | Status: AC
  Administered 2015-05-01: 15:00:00 via INTRAVENOUS
  Filled 2015-05-01: qty 250

## 2015-05-01 NOTE — Patient Instructions (Signed)
[facility] Discharge Instructions for Patients Receiving Chemotherapy  Today you received the following chemotherapy agents gemzar  To help prevent nausea and vomiting after your treatment, we encourage you to take your nausea medication as directed  If you develop nausea and vomiting that is not controlled by your nausea medication, call the clinic.   BELOW ARE SYMPTOMS THAT SHOULD BE REPORTED IMMEDIATELY:  *FEVER GREATER THAN 100.5 F  *CHILLS WITH OR WITHOUT FEVER  NAUSEA AND VOMITING THAT IS NOT CONTROLLED WITH YOUR NAUSEA MEDICATION  *UNUSUAL SHORTNESS OF BREATH  *UNUSUAL BRUISING OR BLEEDING  TENDERNESS IN MOUTH AND THROAT WITH OR WITHOUT PRESENCE OF ULCERS  *URINARY PROBLEMS  *BOWEL PROBLEMS  UNUSUAL RASH Items with * indicate a potential emergency and should be followed up as soon as possible.  Feel free to call the clinic you have any questions or concerns. The clinic phone number is [phone].Gemcitabine injection What is this medicine? GEMCITABINE (jem SIT a been) is a chemotherapy drug. This medicine is used to treat many types of cancer like breast cancer, lung cancer, pancreatic cancer, and ovarian cancer. This medicine may be used for other purposes; ask your health care provider or pharmacist if you have questions. COMMON BRAND NAME(S): Gemzar What should I tell my health care provider before I take this medicine? They need to know if you have any of these conditions: -blood disorders -infection -kidney disease -liver disease -recent or ongoing radiation therapy -an unusual or allergic reaction to gemcitabine, other chemotherapy, other medicines, foods, dyes, or preservatives -pregnant or trying to get pregnant -breast-feeding How should I use this medicine? This drug is given as an infusion into a vein. It is administered in a hospital or clinic by a specially trained health care professional. Talk to your pediatrician  regarding the use of this medicine in children. Special care may be needed. Overdosage: If you think you have taken too much of this medicine contact a poison control center or emergency room at once. NOTE: This medicine is only for you. Do not share this medicine with others. What if I miss a dose? It is important not to miss your dose. Call your doctor or health care professional if you are unable to keep an appointment. What may interact with this medicine? -medicines to increase blood counts like filgrastim, pegfilgrastim, sargramostim -some other chemotherapy drugs like cisplatin -vaccines Talk to your doctor or health care professional before taking any of these medicines: -acetaminophen -aspirin -ibuprofen -ketoprofen -naproxen This list may not describe all possible interactions. Give your health care provider a list of all the medicines, herbs, non-prescription drugs, or dietary supplements you use. Also tell them if you smoke, drink alcohol, or use illegal drugs. Some items may interact with your medicine. What should I watch for while using this medicine? Visit your doctor for checks on your progress. This drug may make you feel generally unwell. This is not uncommon, as chemotherapy can affect healthy cells as well as cancer cells. Report any side effects. Continue your course of treatment even though you feel ill unless your doctor tells you to stop. In some cases, you may be given additional medicines to help with side effects. Follow all directions for their use. Call your doctor or health care professional for advice if you get a fever, chills or sore throat, or other symptoms of a cold or flu. Do not treat yourself. This drug decreases your body's ability to fight infections. Try to avoid being around people  who are sick. This medicine may increase your risk to bruise or bleed. Call your doctor or health care professional if you notice any unusual bleeding. Be careful brushing  and flossing your teeth or using a toothpick because you may get an infection or bleed more easily. If you have any dental work done, tell your dentist you are receiving this medicine. Avoid taking products that contain aspirin, acetaminophen, ibuprofen, naproxen, or ketoprofen unless instructed by your doctor. These medicines may hide a fever. Women should inform their doctor if they wish to become pregnant or think they might be pregnant. There is a potential for serious side effects to an unborn child. Talk to your health care professional or pharmacist for more information. Do not breast-feed an infant while taking this medicine. What side effects may I notice from receiving this medicine? Side effects that you should report to your doctor or health care professional as soon as possible: -allergic reactions like skin rash, itching or hives, swelling of the face, lips, or tongue -low blood counts - this medicine may decrease the number of white blood cells, red blood cells and platelets. You may be at increased risk for infections and bleeding. -signs of infection - fever or chills, cough, sore throat, pain or difficulty passing urine -signs of decreased platelets or bleeding - bruising, pinpoint red spots on the skin, black, tarry stools, blood in the urine -signs of decreased red blood cells - unusually weak or tired, fainting spells, lightheadedness -breathing problems -chest pain -mouth sores -nausea and vomiting -pain, swelling, redness at site where injected -pain, tingling, numbness in the hands or feet -stomach pain -swelling of ankles, feet, hands -unusual bleeding Side effects that usually do not require medical attention (report to your doctor or health care professional if they continue or are bothersome): -constipation -diarrhea -hair loss -loss of appetite -stomach upset This list may not describe all possible side effects. Call your doctor for medical advice about side  effects. You may report side effects to FDA at 1-800-FDA-1088. Where should I keep my medicine? This drug is given in a hospital or clinic and will not be stored at home. NOTE: This sheet is a summary. It may not cover all possible information. If you have questions about this medicine, talk to your doctor, pharmacist, or health care provider.  2015, Elsevier/Gold Standard. (2008-04-25 18:45:54)

## 2015-05-01 NOTE — Progress Notes (Signed)
Pt here today for follow up and treatment; offers no complaints today

## 2015-05-01 NOTE — Progress Notes (Signed)
[facility] day:  05/01/2015  Chief Complaint: Cassidy Bradley is an 54 y.o. female with stage IV uterine leiomyosarcoma who is seen for assessment prior to cycle #3 gemcitabine and Taxotere.  HPI: The patient was last seen in the medical oncology clinic on 04/17/2015.  At that time, she was doing well.  She received day 8 Taxotere and gemcitabine followed by Neulasta the following day.  Nadir counts on 04/23/2015 revealed a hematocrit of 32.6, hemoglobin 10.9, platelets 111,000, WBC 5700 and ANC 3800.  She states that this cycle went better as she took her scheduled steroids the day before and day after.  She denies any numbness or tingling of her hands or feet.  She did note some transient pain in her toes.  She denies any tearing.  She denies any fevers or infections.  She has felt great for the past week.  Past Medical History  Diagnosis Date  . Breast lump 2006     both breast   . Cancer     Past Surgical History  Procedure Laterality Date  . Cholecystectomy  2001  . Ankle surgery  2005  . Cesarean section  1993  . Back surgery  2005  . Breast biopsy Bilateral   . Colonoscopy    . Abdominal hysterectomy  01/30/15    Family History  Problem Relation Age of Onset  . Breast cancer Sister 90  . Breast cancer Maternal Aunt   . Breast cancer Maternal Grandmother 60  . Stomach cancer Maternal Aunt   . Lung cancer Maternal Grandfather   . Colon polyps Mother   . Cancer - Colon Cousin 67    first cousin  . Lupus Father   . Esophageal cancer Maternal Uncle     Social History:  reports that she has never smoked. She has never used smokeless tobacco. She reports that she does not drink alcohol or use illicit drugs.  Allergies:  Allergies  Allergen Reactions  . Pravastatin Hives and Nausea And Vomiting  . Sulfa Antibiotics Hives    Other reaction(s): Localized superficial swelling of skin  . Latex Rash  . Other Rash    Spandex     Current Outpatient Prescriptions  Medication Sig Dispense Refill  . cholecalciferol (VITAMIN D) 1000 UNITS tablet Take 1,000 Units by mouth daily.    Marland Kitchen dexamethasone (DECADRON) 4 MG tablet Take 4 mg by mouth. Take 2 tabs twice a day  For 3 days with chemo treatment    . loratadine (CLARITIN) 10 MG tablet Take 10 mg by mouth daily.    . ondansetron (ZOFRAN) 8 MG tablet Take by mouth every 8 (eight) hours as needed for nausea or vomiting.    Marland Kitchen oxyCODONE-acetaminophen (PERCOCET/ROXICET) 5-325 MG per tablet Take 1 tablet by mouth every 6 (six) hours as needed for severe pain.    . polyethylene glycol powder (GLYCOLAX/MIRALAX) powder 255 grams one bottle for colonoscopy prep 255 g 0   No current facility-administered medications for this visit.   Facility-Administered Medications Ordered in Other Visits  Medication Dose Route Frequency Provider Last Rate Last Dose  . Gemcitabine HCl (GEMZAR) 1,672 mg in sodium chloride 0.9 % 100 mL chemo infusion  900 mg/m2 (Treatment Plan Actual) Intravenous Once Lequita Asal, MD      . heparin lock flush 100 unit/mL  500 Units Intracatheter Once PRN Lequita Asal, MD      . sodium chloride 0.9 % injection 10 mL  10 mL Intracatheter PRN Lequita Asal, MD        ROS  GENERAL:  Feels good.  Active.  No fevers, sweats or weight loss. PERFORMANCE STATUS (ECOG):  1 HEENT:  No visual changes, runny nose, sore throat, mouth sores or tenderness. Lungs: No shortness of breath or cough.  No hemoptysis. Cardiac:  No chest pain, palpitations, orthopnea, or PND. GI:  No nausea, vomiting, diarrhea, constipation, melena or hematochezia. GU:  No urgency, frequency, dysuria, or hematuria. Musculoskeletal:  No back pain.  No joint pain.  No muscle tenderness. Extremities:  No pain or swelling. Skin:  No rashes or skin changes. Neuro:  No headache, numbness or weakness, balance or coordination issues. Endocrine:  No diabetes, thyroid issues, hot flashes  or night sweats. Psych:  No mood changes, depression or anxiety. Pain:  No focal pain. Review of systems:  All other systems reviewed and found to be negative.  Physical Exam  Blood pressure 126/87, pulse 96, temperature 97.9 F (36.6 C), temperature source Tympanic, height $RemoveBeforeDE'5\' 4"'[id number]$  (1.626 m), weight 171 lb 8.3 oz (77.8 kg).  GENERAL:  Well developed, well nourished, sitting comfortably in the exam room in no acute distress. MENTAL STATUS:  Alert and oriented to person, place and time. HEAD:  Wearing a black cap.  Alopecia totalis.  Normocephalic, atraumatic, face symmetric, no Cushingoid features. EYES:  Glasses.  Brown eyes.  Pupils equal round and reactive to light and accomodation.  No conjunctivitis or scleral icterus. ENT:  Oropharynx clear without lesion.  Tongue normal. Mucous membranes moist.  RESPIRATORY:  Clear to auscultation without rales, wheezes or rhonchi. CARDIOVASCULAR:  Regular rate and rhythm without murmur, rub or gallop. ABDOMEN:  Soft, non-tender, with active bowel sounds, and no hepatosplenomegaly.  No masses. SKIN:  Slight peeling of hands.  No rashes, ulcers or lesions. EXTREMITIES: No edema, no skin discoloration or tenderness.  No palpable cords. LYMPH NODES: No palpable cervical, supraclavicular, axillary or inguinal adenopathy  NEUROLOGICAL: Unremarkable. PSYCH:  Appropriate.  [facility] Test Results: On 04/23/2015:  hematocrit of 32.6, hemoglobin 10.9, platelets 111,000, WBC 5700 and ANC 3800. On 04/30/2015:  hematocrit of 34.5, hemoglobin 11.4, platelets 263,000, WBC 14,900 and ANC 11,900.  Creatinine 0.66, albumen 4.0, AST 35, ALT 46, bilirubin 0.2, alk phos 127.  Assessment:  The patient is a 54 year old African American woman with stage IV uterine leiomyosarcoma.  She underwent TAH/BSO on 01/30/2015.  Pathology confirmed high grade leiomyosarcoma with 2 large adjacent tumor nodules (10 cm and 5 cm) and a separate 0.5 cm serosal nodule.     Chest, abdomen, and  pelvic CT scan on 02/12/2015 revealed a 1.7 x 2.3 cm right external iliac node. Pulmonary nodules were scattered throughout the lungs bilaterally. There was left internal mammary chain adenopathy measuring up to 3.6 x 2 cm.  She underwent anterior chest wall pleural-based CT guided biopsy on 02/21/2015. Pathology was consistent with metastatic leiomyosarcoma.  Bone scan on 02/28/2015 revealed no evidence of metastatic disease with indeterminate uptake in the left posterior rib.   She has a fraternal twin who developed breast cancer at age 52.  Several other family members have malignancies.  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  Prechemotherapy labs from [facility] revealed leukopenia (WBC 2300 with ANC 900).  Work-up to date is negative.  She has a family history of leukopenia.  Bone marrow on 03/08/2015 revealed no evidence of neoplasia or metastatic disease.  Marrow was  normocellular for age (30-50%) with trilineage hematopoiesis.  There was no increase in marrow reticulin fibers. Flow cytometry and cytogenetics were normal.  She is status post 2 cycles of gemcitabine and Taxotere (03/20/2015 - 04/10/2015) with Neulasta support.  Nadir counts following cycle #1 on 04/02/2015 included a WBC 1400, ANC 600, and platelets 76,000.  Nadir counts following cycle #2 included a WBC 5700, ANC 3800, and platelets 111,000.  She experienced transient pain in her toes.  Symptomatically, she feels good.  Exam is unremarkable.  Plan:   1)  Review yesterday's labs from [facility]. 2)  Begin cycle #3 gemcitabine and Taxotere. 3)  Patient to return next week for day 8 chemotherapy followed by [facility] on day 9. 4)  Complete slips [facility] labs at [facility]. 5)  Discuss plans for follow-up imaging after 4 cycles. 6)  RTC in 3 weeks for MD assessment, review of [facility] labs, and cycle #4 gemcitabine and Taxotere.  Lequita Asal, MD   05/01/2015, 3:54 PM

## 2015-05-08 ENCOUNTER — Other Ambulatory Visit: Payer: Self-pay | Admitting: Hematology and Oncology

## 2015-05-08 ENCOUNTER — Inpatient Hospital Stay: Payer: 59

## 2015-05-08 VITALS — BP 115/72 | HR 99 | Temp 96.3°F | Resp 18

## 2015-05-08 DIAGNOSIS — C55 Malignant neoplasm of uterus, part unspecified: Secondary | ICD-10-CM

## 2015-05-08 MED ORDER — SODIUM CHLORIDE 0.9 % IV SOLN
900.0000 mg/m2 | Freq: Once | INTRAVENOUS | Status: AC
  Administered 2015-05-08: 1672 mg via INTRAVENOUS
  Filled 2015-05-08: qty 5.26

## 2015-05-08 MED ORDER — PEGFILGRASTIM 6 MG/0.6ML ~~LOC~~ PSKT
6.0000 mg | PREFILLED_SYRINGE | Freq: Once | SUBCUTANEOUS | Status: AC
  Administered 2015-05-08: 6 mg via SUBCUTANEOUS
  Filled 2015-05-08: qty 0.6

## 2015-05-08 MED ORDER — HEPARIN SOD (PORK) LOCK FLUSH 100 UNIT/ML IV SOLN
500.0000 [IU] | Freq: Once | INTRAVENOUS | Status: AC | PRN
  Administered 2015-05-08: 500 [IU]
  Filled 2015-05-08: qty 5

## 2015-05-08 MED ORDER — SODIUM CHLORIDE 0.9 % IV SOLN
Freq: Once | INTRAVENOUS | Status: AC
  Administered 2015-05-08: 11:00:00 via INTRAVENOUS
  Filled 2015-05-08: qty 4

## 2015-05-08 MED ORDER — FAMOTIDINE IN NACL 20-0.9 MG/50ML-% IV SOLN
20.0000 mg | Freq: Once | INTRAVENOUS | Status: AC
  Administered 2015-05-08: 20 mg via INTRAVENOUS
  Filled 2015-05-08: qty 50

## 2015-05-08 MED ORDER — SODIUM CHLORIDE 0.9 % IJ SOLN
10.0000 mL | INTRAMUSCULAR | Status: DC | PRN
  Administered 2015-05-08: 10 mL
  Filled 2015-05-08: qty 10

## 2015-05-08 MED ORDER — DIPHENHYDRAMINE HCL 50 MG/ML IJ SOLN
25.0000 mg | Freq: Once | INTRAMUSCULAR | Status: AC
Start: 2015-05-08 — End: 2015-05-08
  Administered 2015-05-08: 25 mg via INTRAVENOUS
  Filled 2015-05-08: qty 1

## 2015-05-08 MED ORDER — DOCETAXEL CHEMO INJECTION 160 MG/16ML
75.0000 mg/m2 | Freq: Once | INTRAVENOUS | Status: AC
  Administered 2015-05-08: 140 mg via INTRAVENOUS
  Filled 2015-05-08: qty 14

## 2015-05-08 MED ORDER — SODIUM CHLORIDE 0.9 % IV SOLN
Freq: Once | INTRAVENOUS | Status: AC
  Administered 2015-05-08: 11:00:00 via INTRAVENOUS
  Filled 2015-05-08: qty 1000

## 2015-05-14 ENCOUNTER — Encounter: Payer: Self-pay | Admitting: Hematology and Oncology

## 2015-05-14 ENCOUNTER — Telehealth: Payer: Self-pay | Admitting: *Deleted

## 2015-05-14 NOTE — Telephone Encounter (Signed)
She reports she did eat some new food over weekend, she will use hydrocortisone creama nd call back if not better

## 2015-05-14 NOTE — Telephone Encounter (Signed)
  Shouldn't be.  She has had the same chemotherapy for several cycles.  Has she used any new soaps, lotions, detergents, perfumes, outdoor exposure, new food, eaten out?  M

## 2015-05-14 NOTE — Telephone Encounter (Signed)
Has a red spot like a bruise on her left face cheek which does not itch and a spot on her neck which itches. They came up over the weekend. She is asking if she has an allergy to the chemo she got last week

## 2015-05-14 NOTE — Telephone Encounter (Signed)
Pt denies any new detergents, soaps, lotions; no fever; no bleeding-except "tiny" nosebleed due to her sinuses; encouraged to contact us if any bleeding, bruising or rash worsens; pt verbalized understanding of

## 2015-05-17 ENCOUNTER — Telehealth: Payer: Self-pay

## 2015-05-17 NOTE — Telephone Encounter (Signed)
Pt called to inquire about her labs drawn on 5/16 at [facility]; Dr. Mike Gip reviewed and pt informed; pt verbalized understanding of

## 2015-05-22 ENCOUNTER — Inpatient Hospital Stay (HOSPITAL_BASED_OUTPATIENT_CLINIC_OR_DEPARTMENT_OTHER): Payer: 59 | Admitting: Hematology and Oncology

## 2015-05-22 ENCOUNTER — Inpatient Hospital Stay: Payer: 59

## 2015-05-22 VITALS — BP 125/82 | HR 102 | Temp 97.7°F | Resp 18 | Ht 64.0 in | Wt 176.9 lb

## 2015-05-22 DIAGNOSIS — C55 Malignant neoplasm of uterus, part unspecified: Secondary | ICD-10-CM

## 2015-05-22 DIAGNOSIS — B370 Candidal stomatitis: Secondary | ICD-10-CM

## 2015-05-22 DIAGNOSIS — Z79899 Other long term (current) drug therapy: Secondary | ICD-10-CM

## 2015-05-22 MED ORDER — HEPARIN SOD (PORK) LOCK FLUSH 100 UNIT/ML IV SOLN
500.0000 [IU] | Freq: Once | INTRAVENOUS | Status: AC | PRN
  Administered 2015-05-22: 500 [IU]
  Filled 2015-05-22: qty 5

## 2015-05-22 MED ORDER — SODIUM CHLORIDE 0.9 % IV SOLN
Freq: Once | INTRAVENOUS | Status: AC
  Administered 2015-05-22: 16:00:00 via INTRAVENOUS
  Filled 2015-05-22: qty 4

## 2015-05-22 MED ORDER — SODIUM CHLORIDE 0.9 % IV SOLN
900.0000 mg/m2 | Freq: Once | INTRAVENOUS | Status: AC
  Administered 2015-05-22: 1672 mg via INTRAVENOUS
  Filled 2015-05-22: qty 38.71

## 2015-05-22 MED ORDER — SODIUM CHLORIDE 0.9 % IV SOLN
Freq: Once | INTRAVENOUS | Status: AC
Start: 2015-05-22 — End: 2015-05-22
  Administered 2015-05-22: 15:00:00 via INTRAVENOUS
  Filled 2015-05-22: qty 250

## 2015-05-22 MED ORDER — DEXAMETHASONE 4 MG PO TABS: ORAL_TABLET | ORAL | Status: DC

## 2015-05-22 MED ORDER — FAMOTIDINE IN NACL 20-0.9 MG/50ML-% IV SOLN: 20.0000 mg | Freq: Once | INTRAVENOUS | Status: DC

## 2015-05-22 MED ORDER — SODIUM CHLORIDE 0.9 % IJ SOLN
10.0000 mL | INTRAMUSCULAR | Status: DC | PRN
  Administered 2015-05-22: 10 mL
  Filled 2015-05-22: qty 10

## 2015-05-23 ENCOUNTER — Other Ambulatory Visit: Payer: Self-pay

## 2015-05-23 DIAGNOSIS — C55 Malignant neoplasm of uterus, part unspecified: Secondary | ICD-10-CM

## 2015-05-24 ENCOUNTER — Encounter: Payer: Self-pay | Admitting: Emergency Medicine

## 2015-05-24 ENCOUNTER — Inpatient Hospital Stay
Admission: EM | Admit: 2015-05-24 | Discharge: 2015-05-26 | DRG: 872 | Disposition: A | Discharge status: Home / Self Care | Payer: 59 | Attending: Internal Medicine | Admitting: Internal Medicine

## 2015-05-24 ENCOUNTER — Emergency Department: Payer: 59

## 2015-05-24 DIAGNOSIS — R918 Other nonspecific abnormal finding of lung field: Secondary | ICD-10-CM

## 2015-05-24 DIAGNOSIS — R509 Fever, unspecified: Secondary | ICD-10-CM | POA: Diagnosis not present

## 2015-05-24 DIAGNOSIS — L271 Localized skin eruption due to drugs and medicaments taken internally: Secondary | ICD-10-CM | POA: Diagnosis present

## 2015-05-24 DIAGNOSIS — T451X5A Adverse effect of antineoplastic and immunosuppressive drugs, initial encounter: Secondary | ICD-10-CM | POA: Diagnosis present

## 2015-05-24 DIAGNOSIS — R7989 Other specified abnormal findings of blood chemistry: Secondary | ICD-10-CM | POA: Diagnosis present

## 2015-05-24 DIAGNOSIS — Z803 Family history of malignant neoplasm of breast: Secondary | ICD-10-CM

## 2015-05-24 DIAGNOSIS — R945 Abnormal results of liver function studies: Secondary | ICD-10-CM

## 2015-05-24 DIAGNOSIS — A419 Sepsis, unspecified organism: Secondary | ICD-10-CM | POA: Diagnosis not present

## 2015-05-24 DIAGNOSIS — T50905A Adverse effect of unspecified drugs, medicaments and biological substances, initial encounter: Secondary | ICD-10-CM | POA: Diagnosis present

## 2015-05-24 DIAGNOSIS — Z801 Family history of malignant neoplasm of trachea, bronchus and lung: Secondary | ICD-10-CM

## 2015-05-24 DIAGNOSIS — C55 Malignant neoplasm of uterus, part unspecified: Secondary | ICD-10-CM | POA: Diagnosis present

## 2015-05-24 DIAGNOSIS — B370 Candidal stomatitis: Secondary | ICD-10-CM

## 2015-05-24 DIAGNOSIS — Z8542 Personal history of malignant neoplasm of other parts of uterus: Secondary | ICD-10-CM

## 2015-05-24 DIAGNOSIS — D638 Anemia in other chronic diseases classified elsewhere: Secondary | ICD-10-CM | POA: Diagnosis present

## 2015-05-24 DIAGNOSIS — C7800 Secondary malignant neoplasm of unspecified lung: Secondary | ICD-10-CM | POA: Diagnosis present

## 2015-05-24 DIAGNOSIS — D649 Anemia, unspecified: Secondary | ICD-10-CM | POA: Diagnosis present

## 2015-05-24 DIAGNOSIS — Z800 Family history of malignant neoplasm of digestive organs: Secondary | ICD-10-CM

## 2015-05-24 DIAGNOSIS — D72819 Decreased white blood cell count, unspecified: Secondary | ICD-10-CM | POA: Diagnosis present

## 2015-05-24 DIAGNOSIS — R5383 Other fatigue: Secondary | ICD-10-CM | POA: Diagnosis present

## 2015-05-24 DIAGNOSIS — Z79891 Long term (current) use of opiate analgesic: Secondary | ICD-10-CM

## 2015-05-24 DIAGNOSIS — Z79899 Other long term (current) drug therapy: Secondary | ICD-10-CM

## 2015-05-24 DIAGNOSIS — R079 Chest pain, unspecified: Secondary | ICD-10-CM | POA: Diagnosis present

## 2015-05-24 HISTORY — DX: Malignant neoplasm of uterus, part unspecified: C55

## 2015-05-24 HISTORY — DX: Localized swelling, mass and lump, trunk: R22.2

## 2015-05-24 MED ORDER — VANCOMYCIN HCL IN DEXTROSE 1-5 GM/200ML-% IV SOLN
1000.0000 mg | Freq: Once | INTRAVENOUS | Status: AC
  Administered 2015-05-24: 1000 mg via INTRAVENOUS

## 2015-05-24 MED ORDER — VANCOMYCIN HCL IN DEXTROSE 1-5 GM/200ML-% IV SOLN
INTRAVENOUS | Status: AC
  Administered 2015-05-24: 1000 mg via INTRAVENOUS
  Filled 2015-05-24: qty 200

## 2015-05-24 MED ORDER — PIPERACILLIN-TAZOBACTAM 3.375 G IVPB
INTRAVENOUS | Status: AC
  Administered 2015-05-24: 3.375 g via INTRAVENOUS
  Filled 2015-05-24: qty 50

## 2015-05-24 MED ORDER — PIPERACILLIN-TAZOBACTAM 3.375 G IVPB
3.3750 g | Freq: Once | INTRAVENOUS | Status: AC
  Administered 2015-05-24: 3.375 g via INTRAVENOUS

## 2015-05-24 NOTE — ED Notes (Addendum)
Fever since today, chest pain and productive cough.  Pt has uterine cancer had chemo Tuesday, no dysuria or n.v.  Also has mass to chest wall.

## 2015-05-24 NOTE — ED Notes (Signed)
Pt reports fever, cough, and CP x 2 days.  Pt HX uterine ca and recently dx with chest wall mass and being tx.  Last chemo Tuesday.  Pt NAD at this time.

## 2015-05-24 NOTE — ED Provider Notes (Signed)
[facility] Emergency Department Provider Note  ____________________________________________  Time seen: 11:00 PM  I have reviewed the triage vital signs and the nursing notes.   HISTORY  Chief Complaint Fever      HPI Cassidy Bradley is a 54 y.o. female presents with fever and productive cough times today patient also admits to central chest discomfort times a day as well currently 3 out of 10. Of note patient is undergoing chemotherapy for uterine cancer last chemotherapy performed 2 days prior    Past Medical History  Diagnosis Date  . Breast lump 2006     both breast   . Cancer     Patient Active Problem List   Diagnosis Date Noted  . 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 Encounter for screening colonoscopy 08/25/2014  . Lump or mass in breast 06/23/2013  . Essential hypertension, benign 03/30/2013  . Benign breast cyst in female 03/29/2013    Past Surgical History  Procedure Laterality Date  . Cholecystectomy  2001  . Ankle surgery  2005  . Cesarean section  1993  . Back surgery  2005  . Breast biopsy Bilateral   . Colonoscopy    . Abdominal hysterectomy  01/30/15    Current Outpatient Rx  Name  Route  Sig  Dispense  Refill  . Alum & Mag Hydroxide-Simeth (MAGIC MOUTHWASH) SOLN            0   . cholecalciferol (VITAMIN D) 1000 UNITS tablet   Oral   Take 1,000 Units by mouth daily.         Marland Kitchen dexamethasone (DECADRON) 4 MG tablet      Take 2 tabs twice a day  For 3 days with chemo treatment   10 tablet   1   . lidocaine-prilocaine (EMLA) cream   Topical   Apply 1 application topically as needed. Apply one hour prior to chemotherapy   30 g   1   . loratadine (CLARITIN) 10 MG tablet   Oral   Take 10 mg by mouth daily.         . ondansetron (ZOFRAN) 8 MG tablet   Oral   Take by mouth every 8 (eight) hours as needed for nausea or vomiting.         Marland Kitchen oxyCODONE-acetaminophen (PERCOCET/ROXICET) 5-325 MG per  tablet   Oral   Take 1 tablet by mouth every 6 (six) hours as needed for severe pain.         . polyethylene glycol powder (GLYCOLAX/MIRALAX) powder      255 grams one bottle for colonoscopy prep   255 g   0     Allergies Pravastatin; Sulfa antibiotics; Latex; and Other  Family History  Problem Relation Age of Onset  . Breast cancer Sister 44  . Breast cancer Maternal Aunt   . Breast cancer Maternal Grandmother 60  . Stomach cancer Maternal Aunt   . Lung cancer Maternal Grandfather   . Colon polyps Mother   . Cancer - Colon Cousin 35    first cousin  . Lupus Father   . Esophageal cancer Maternal Uncle     Social History History  Substance Use Topics  . Smoking status: Never Smoker   . Smokeless tobacco: Never Used  . Alcohol Use: No    Review of Systems  Constitutional: Positive for fever. Eyes: Negative for visual changes. ENT: Negative for sore throat. Cardiovascular: Negative for chest pain. Respiratory: Negative for shortness of breath. As a  for cough Gastrointestinal: Negative for abdominal pain, vomiting and diarrhea. Genitourinary: Negative for dysuria. Musculoskeletal: Negative for back pain. Skin: Negative for rash. Neurological: Negative for headaches, focal weakness or numbness.    10-point ROS otherwise negative.  ____________________________________________   PHYSICAL EXAM:  VITAL SIGNS: ED Triage Vitals  Enc Vitals Group     BP 05/24/15 2219 110/66 mmHg     Pulse Rate 05/24/15 2219 111     Resp 05/24/15 2219 18     Temp 05/24/15 2219 99 F (37.2 C)     Temp Source 05/24/15 2219 Oral     SpO2 05/24/15 2219 100 %     Weight 05/24/15 2219 175 lb (79.379 kg)     Height 05/24/15 2219 5\' 4"  (1.626 m)     Head Cir --      Peak Flow --      Pain Score 05/24/15 2219 6     Pain Loc --      Pain Edu? --      [place] --      Constitutional: Alert and oriented. Well appearing and in no distress. Eyes: Conjunctivae are normal.  PERRL. Normal extraocular movements. ENT   Head: Normocephalic and atraumatic.   Nose: No congestion/rhinnorhea.   Mouth/Throat: Mucous membranes are moist.   Neck: No stridor. Cardiovascular: Normal rate, regular rhythm. Normal and symmetric distal pulses are present in all extremities. No murmurs, rubs, or gallops. Respiratory: Normal respiratory effort without tachypnea nor retractions. Breath sounds are clear and equal bilaterally. No wheezes/rales/rhonchi. Gastrointestinal: Soft and nontender. No distention. There is no CVA tenderness. Genitourinary: deferred Musculoskeletal: Nontender with normal range of motion in all extremities. No joint effusions.  No lower extremity tenderness nor edema. Neurologic:  Normal speech and language. No gross focal neurologic deficits are appreciated. Speech is normal.  Skin:  Skin is warm, dry and intact. No rash noted. Psychiatric: Mood and affect are normal. Speech and behavior are normal. Patient exhibits appropriate insight and judgment.  ____________________________________________    LABS (pertinent positives/negatives)  Labs Reviewed  CBC - Abnormal; Notable for the following:    RBC 3.24 (*)    Hemoglobin 9.5 (*)    HCT 29.4 (*)    RDW 17.2 (*)    All other components within normal limits  COMPREHENSIVE METABOLIC PANEL - Abnormal; Notable for the following:    Glucose, Bld 111 (*)    Calcium 8.5 (*)    Total Protein 5.9 (*)    Albumin 3.0 (*)    AST 66 (*)    ALT 58 (*)    Anion gap 4 (*)    All other components within normal limits  URINALYSIS COMPLETEWITH MICROSCOPIC ([facility] ONLY) - Abnormal; Notable for the following:    Color, Urine STRAW (*)    APPearance CLEAR (*)    Hgb urine dipstick 1+ (*)    Squamous Epithelial / LPF 0-5 (*)    All other components within normal limits  CBC - Abnormal; Notable for the following:    RBC 3.02 (*)    Hemoglobin 9.0 (*)    HCT 27.4 (*)    RDW 17.5 (*)    All other  components within normal limits  COMPREHENSIVE METABOLIC PANEL - Abnormal; Notable for the following:    Calcium 8.4 (*)    Total Protein 5.7 (*)    Albumin 2.9 (*)    AST 62 (*)    ALT 55 (*)    All other components within  normal limits  CULTURE, BLOOD (ROUTINE X 2)  CULTURE, BLOOD (ROUTINE X 2)  CREATININE, SERUM     ____________________________________________    INITIAL IMPRESSION / ASSESSMENT AND PLAN / ED COURSE  Pertinent labs & imaging results that were available during my care of the patient were reviewed by me and considered in my medical decision making (see chart for details).  History physical exam consistent with possible pneumonia and patient currently undergoing chemotherapy as such we will give antipyretic (Tylenol), normal saline 2 L and IV antibiotic therapy while we await blood culture results.  ____________________________________________   FINAL CLINICAL IMPRESSION(S) / ED DIAGNOSES  Final diagnoses:  Febrile illness, acute      Gregor Hams, MD 06/01/15 [phone]

## 2015-05-25 ENCOUNTER — Ambulatory Visit: Admission: RE | Admit: 2015-05-25 | Payer: 59 | Source: Ambulatory Visit

## 2015-05-25 ENCOUNTER — Inpatient Hospital Stay: Payer: 59

## 2015-05-25 ENCOUNTER — Encounter: Payer: Self-pay | Admitting: Internal Medicine

## 2015-05-25 DIAGNOSIS — R509 Fever, unspecified: Secondary | ICD-10-CM

## 2015-05-25 DIAGNOSIS — C55 Malignant neoplasm of uterus, part unspecified: Secondary | ICD-10-CM

## 2015-05-25 DIAGNOSIS — Z801 Family history of malignant neoplasm of trachea, bronchus and lung: Secondary | ICD-10-CM | POA: Diagnosis not present

## 2015-05-25 DIAGNOSIS — D638 Anemia in other chronic diseases classified elsewhere: Secondary | ICD-10-CM | POA: Diagnosis present

## 2015-05-25 DIAGNOSIS — R945 Abnormal results of liver function studies: Secondary | ICD-10-CM

## 2015-05-25 DIAGNOSIS — R7989 Other specified abnormal findings of blood chemistry: Secondary | ICD-10-CM | POA: Diagnosis present

## 2015-05-25 DIAGNOSIS — Z79899 Other long term (current) drug therapy: Secondary | ICD-10-CM | POA: Diagnosis not present

## 2015-05-25 DIAGNOSIS — D649 Anemia, unspecified: Secondary | ICD-10-CM | POA: Diagnosis present

## 2015-05-25 DIAGNOSIS — R918 Other nonspecific abnormal finding of lung field: Secondary | ICD-10-CM | POA: Diagnosis present

## 2015-05-25 DIAGNOSIS — Z8542 Personal history of malignant neoplasm of other parts of uterus: Secondary | ICD-10-CM | POA: Diagnosis not present

## 2015-05-25 DIAGNOSIS — A419 Sepsis, unspecified organism: Secondary | ICD-10-CM | POA: Diagnosis present

## 2015-05-25 DIAGNOSIS — D72819 Decreased white blood cell count, unspecified: Secondary | ICD-10-CM | POA: Diagnosis present

## 2015-05-25 DIAGNOSIS — Z79891 Long term (current) use of opiate analgesic: Secondary | ICD-10-CM | POA: Diagnosis not present

## 2015-05-25 DIAGNOSIS — R079 Chest pain, unspecified: Secondary | ICD-10-CM | POA: Diagnosis present

## 2015-05-25 DIAGNOSIS — Z800 Family history of malignant neoplasm of digestive organs: Secondary | ICD-10-CM | POA: Diagnosis not present

## 2015-05-25 DIAGNOSIS — B379 Candidiasis, unspecified: Secondary | ICD-10-CM

## 2015-05-25 DIAGNOSIS — L271 Localized skin eruption due to drugs and medicaments taken internally: Secondary | ICD-10-CM | POA: Diagnosis present

## 2015-05-25 DIAGNOSIS — B370 Candidal stomatitis: Secondary | ICD-10-CM | POA: Diagnosis present

## 2015-05-25 DIAGNOSIS — T451X5A Adverse effect of antineoplastic and immunosuppressive drugs, initial encounter: Secondary | ICD-10-CM | POA: Diagnosis present

## 2015-05-25 DIAGNOSIS — L03211 Cellulitis of face: Secondary | ICD-10-CM | POA: Diagnosis not present

## 2015-05-25 DIAGNOSIS — Z803 Family history of malignant neoplasm of breast: Secondary | ICD-10-CM | POA: Diagnosis not present

## 2015-05-25 DIAGNOSIS — C7800 Secondary malignant neoplasm of unspecified lung: Secondary | ICD-10-CM | POA: Diagnosis present

## 2015-05-25 DIAGNOSIS — R5383 Other fatigue: Secondary | ICD-10-CM | POA: Diagnosis present

## 2015-05-25 LAB — CBC
HCT: 27.4 % — ABNORMAL LOW (ref 35.0–47.0)
HEMATOCRIT: 29.4 % — AB (ref 35.0–47.0)
HEMOGLOBIN: 9.5 g/dL — AB (ref 12.0–16.0)
Hemoglobin: 9 g/dL — ABNORMAL LOW (ref 12.0–16.0)
MCH: 29.4 pg (ref 26.0–34.0)
MCH: 29.7 pg (ref 26.0–34.0)
MCHC: 32.4 g/dL (ref 32.0–36.0)
MCHC: 32.7 g/dL (ref 32.0–36.0)
MCV: 90.8 fL (ref 80.0–100.0)
MCV: 90.8 fL (ref 80.0–100.0)
PLATELETS: 339 10*3/uL (ref 150–440)
Platelets: 360 10*3/uL (ref 150–440)
RBC: 3.02 MIL/uL — ABNORMAL LOW (ref 3.80–5.20)
RBC: 3.24 MIL/uL — ABNORMAL LOW (ref 3.80–5.20)
RDW: 17.2 % — ABNORMAL HIGH (ref 11.5–14.5)
RDW: 17.5 % — AB (ref 11.5–14.5)
WBC: 5 10*3/uL (ref 3.6–11.0)
WBC: 6.7 10*3/uL (ref 3.6–11.0)

## 2015-05-25 LAB — COMPREHENSIVE METABOLIC PANEL
ALK PHOS: 72 U/L (ref 38–126)
ALT: 55 U/L — AB (ref 14–54)
ALT: 58 U/L — AB (ref 14–54)
AST: 62 U/L — AB (ref 15–41)
AST: 66 U/L — ABNORMAL HIGH (ref 15–41)
Albumin: 2.9 g/dL — ABNORMAL LOW (ref 3.5–5.0)
Albumin: 3 g/dL — ABNORMAL LOW (ref 3.5–5.0)
Alkaline Phosphatase: 73 U/L (ref 38–126)
Anion gap: 4 — ABNORMAL LOW (ref 5–15)
Anion gap: 6 (ref 5–15)
BUN: 11 mg/dL (ref 6–20)
BUN: 11 mg/dL (ref 6–20)
CALCIUM: 8.4 mg/dL — AB (ref 8.9–10.3)
CALCIUM: 8.5 mg/dL — AB (ref 8.9–10.3)
CHLORIDE: 105 mmol/L (ref 101–111)
CO2: 25 mmol/L (ref 22–32)
CO2: 26 mmol/L (ref 22–32)
CREATININE: 0.6 mg/dL (ref 0.44–1.00)
Chloride: 106 mmol/L (ref 101–111)
Creatinine, Ser: 0.65 mg/dL (ref 0.44–1.00)
GFR calc Af Amer: >60 mL/min (ref >60)
GFR calc non Af Amer: >60 mL/min (ref >60)
GLUCOSE: 111 mg/dL — AB (ref 65–99)
GLUCOSE: 98 mg/dL (ref 65–99)
POTASSIUM: 3.9 mmol/L (ref 3.5–5.1)
Potassium: 4 mmol/L (ref 3.5–5.1)
SODIUM: 135 mmol/L (ref 135–145)
Sodium: 137 mmol/L (ref 135–145)
Total Bilirubin: 0.4 mg/dL (ref 0.3–1.2)
Total Bilirubin: 0.5 mg/dL (ref 0.3–1.2)
Total Protein: 5.7 g/dL — ABNORMAL LOW (ref 6.5–8.1)
Total Protein: 5.9 g/dL — ABNORMAL LOW (ref 6.5–8.1)

## 2015-05-25 LAB — URINALYSIS COMPLETE WITH MICROSCOPIC (ARMC ONLY)
Bacteria, UA: NONE SEEN
Bilirubin Urine: NEGATIVE
Glucose, UA: NEGATIVE mg/dL
Ketones, ur: NEGATIVE mg/dL
Leukocytes, UA: NEGATIVE
Nitrite: NEGATIVE
Protein, ur: NEGATIVE mg/dL
Specific Gravity, Urine: 1.005 (ref 1.005–1.030)
pH: 6 (ref 5.0–8.0)

## 2015-05-25 LAB — CREATININE, SERUM
Creatinine, Ser: 0.64 mg/dL (ref 0.44–1.00)
GFR calc non Af Amer: >60 mL/min (ref >60)

## 2015-05-25 MED ORDER — MAGIC MOUTHWASH
5.0000 mL | Freq: Three times a day (TID) | ORAL | Status: DC | PRN
  Filled 2015-05-25: qty 5

## 2015-05-25 MED ORDER — ENOXAPARIN SODIUM 40 MG/0.4ML ~~LOC~~ SOLN
40.0000 mg | SUBCUTANEOUS | Status: DC
  Administered 2015-05-25 – 2015-05-26 (×2): 40 mg via SUBCUTANEOUS
  Filled 2015-05-25 (×2): qty 0.4

## 2015-05-25 MED ORDER — SODIUM CHLORIDE 0.9 % IV SOLN
INTRAVENOUS | Status: AC
  Administered 2015-05-25 (×2): via INTRAVENOUS

## 2015-05-25 MED ORDER — ONDANSETRON HCL 4 MG PO TABS: 4.0000 mg | ORAL_TABLET | Freq: Four times a day (QID) | ORAL | Status: DC | PRN

## 2015-05-25 MED ORDER — OXYCODONE-ACETAMINOPHEN 5-325 MG PO TABS: 1.0000 | ORAL_TABLET | Freq: Four times a day (QID) | ORAL | Status: DC | PRN

## 2015-05-25 MED ORDER — VANCOMYCIN HCL 10 G IV SOLR
1250.0000 mg | Freq: Two times a day (BID) | INTRAVENOUS | Status: DC
  Administered 2015-05-25 – 2015-05-26 (×3): 1250 mg via INTRAVENOUS
  Filled 2015-05-25 (×5): qty 1250

## 2015-05-25 MED ORDER — NYSTATIN 100000 UNIT/ML MT SUSP
5.0000 mL | Freq: Four times a day (QID) | OROMUCOSAL | Status: DC
  Administered 2015-05-25 – 2015-05-26 (×4): 500000 [IU] via ORAL
  Filled 2015-05-25 (×4): qty 5

## 2015-05-25 MED ORDER — ACETAMINOPHEN 325 MG PO TABS: 650.0000 mg | ORAL_TABLET | Freq: Four times a day (QID) | ORAL | Status: DC | PRN

## 2015-05-25 MED ORDER — ACETAMINOPHEN 650 MG RE SUPP: 650.0000 mg | Freq: Four times a day (QID) | RECTAL | Status: DC | PRN

## 2015-05-25 MED ORDER — VITAMIN D 1000 UNITS PO TABS
1000.0000 [IU] | ORAL_TABLET | Freq: Every day | ORAL | Status: DC
  Administered 2015-05-25 – 2015-05-26 (×2): 1000 [IU] via ORAL
  Filled 2015-05-25 (×2): qty 1

## 2015-05-25 MED ORDER — ONDANSETRON HCL 4 MG/2ML IJ SOLN: 4.0000 mg | Freq: Four times a day (QID) | INTRAMUSCULAR | Status: DC | PRN

## 2015-05-25 MED ORDER — SENNOSIDES-DOCUSATE SODIUM 8.6-50 MG PO TABS: 1.0000 | ORAL_TABLET | Freq: Every evening | ORAL | Status: DC | PRN

## 2015-05-25 MED ORDER — PIPERACILLIN-TAZOBACTAM 4.5 G IVPB
4.5000 g | Freq: Three times a day (TID) | INTRAVENOUS | Status: DC
  Administered 2015-05-25 – 2015-05-26 (×4): 4.5 g via INTRAVENOUS
  Filled 2015-05-25 (×8): qty 100

## 2015-05-25 NOTE — Progress Notes (Signed)
ANTIBIOTIC CONSULT NOTE - FOLLOW UP  Pharmacy Consult for Vancomycin/Zosyn Indication: Fever-unknown source   Allergies  Allergen Reactions  . Pravastatin Hives and Nausea And Vomiting  . Sulfa Antibiotics Hives    Other reaction(s): Localized superficial swelling of skin  . Latex Rash  . Other Rash    Spandex    Patient Measurements: Height: 5\' 4"  (162.6 cm) Weight: 175 lb 4.8 oz (79.516 kg) IBW/kg (Calculated) : 54.7 Adjusted Body Weight: 65 kg  Vital Signs: Temp: 98.5 F (36.9 C) (05/27 0544) Temp Source: Oral (05/27 0544) BP: 97/64 mmHg (05/27 0544) Pulse Rate: 95 (05/27 0544) Intake/Output from previous day:   Intake/Output from this shift:    Labs:  Recent Labs  05/24/15 2342 05/25/15 0515  WBC 6.7 5.0  HGB 9.5* 9.0*  PLT 360 339  CREATININE 0.65 0.64  0.60   Estimated Creatinine Clearance: 82.9 mL/min (by C-G formula based on Cr of 0.64).  Microbiology: No results found for this or any previous visit (from the past 720 hour(s)).  Anti-infectives    Start     Dose/Rate Route Frequency Ordered Stop   05/25/15 0600  piperacillin-tazobactam (ZOSYN) IVPB 4.5 g     4.5 g 200 mL/hr over 30 Minutes Intravenous 3 times per day 05/25/15 0305     05/25/15 0600  vancomycin (VANCOCIN) 1,250 mg in sodium chloride 0.9 % 250 mL IVPB     1,250 mg 166.7 mL/hr over 90 Minutes Intravenous Every 12 hours 05/25/15 0305     05/24/15 2330  piperacillin-tazobactam (ZOSYN) IVPB 3.375 g     3.375 g 12.5 mL/hr over 240 Minutes Intravenous  Once 05/24/15 2320 05/25/15 0022   05/24/15 2330  vancomycin (VANCOCIN) IVPB 1000 mg/200 mL premix     1,000 mg 200 mL/hr over 60 Minutes Intravenous  Once 05/24/15 2320 05/25/15 0055      Assessment: Pharmacy consulted to dose zosyn and vancomycin in this 54yo F with history of uterine cancer with Lung mets/mass being treated empicially for possible infection. Patient received Chemo tx on 05/22/15.  BCx: Pending CXR neg. For PNA, UA  NGTD  Goal of Therapy:  Vancomycin trough level 15-20 mcg/ml  Plan:  Zosyn 4.5gm IV Q8hrs ordered due to increased risk for MDR organism. Vancomycin 1250mg  IV Q12hrs to start 6hrs after the 1gm dose given in ED for stacked dosing. Trough ordered for 5/29 at 0530hrs which will be close to steadystate.  Measure antibiotic drug levels at steady state Follow up culture results   Chinita Greenland PharmD Clinical Pharmacist 05/25/2015

## 2015-05-25 NOTE — Plan of Care (Signed)
Problem: Discharge Progression Outcomes Goal: Discharge plan in place and appropriate Individualization: Pt prefers to be called Cassidy Bradley. She is from home with her husband.  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 Currently doing chemo which was last done May 22, 2015. Moderate fall risk with understanding of how to utilize the call system for assistance when out of bed.    Goal: Other Discharge Outcomes/Goals Plan of care progress to goals: 1. C/o mild chest pain r/t lung mass but refused pain medication 2. Hemodynamically: vitals stable, afebrile, IV abx ordered 3. No s/s of complications  4. Pt states no problem with appetite  5. Activity: moderate fall risk, standby assist out of bed  No fall or injury this shift. Will continue to assess.

## 2015-05-25 NOTE — Plan of Care (Signed)
Problem: Discharge Progression Outcomes Goal: Other Discharge Outcomes/Goals Plan of Care Progress To Goal: Hemodynamically: vitals stable, afebrile, IV abx ordered Pt states no problem with appetite   Activity: moderate fall risk, standby assist out of bed   No fall or injury this shift. Will continue to assess.

## 2015-05-25 NOTE — Care Management (Signed)
Admitted to [facility] with the diagnosis of acute febrile illness. Lives with husband, [place], [phone]). Received chemotherapy treatment last Tuesday at the [facility]. Seen Dr, Mike Gip at that time. Uses no aides for ambulation. Takes care of all daily activities of living herself, still drives. No home health in the past. No skilled facility or home oxygen. Either husband or son will transport home when discharged. Shelbie Ammons RN MSN Care Management [phone]

## 2015-05-25 NOTE — H&P (Signed)
[facility] NAME: Cassidy Bradley    MR#:  [id number]  DATE OF BIRTH:  1961/04/02  DATE OF ADMISSION:  05/24/2015  PRIMARY CARE PHYSICIAN: Marinda Elk, MD   REQUESTING/REFERRING PHYSICIAN: Dr. Owens Shark  CHIEF COMPLAINT:   Chief Complaint  Patient presents with  . Fever   cough  HISTORY OF PRESENT ILLNESS:  Cassidy Bradley  is a 54 y.o. 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 Patient states that she was not feeling well since yesterday morning and later in the evening noted to have a elevated temperature. Does complain of cough with minimal sputum. Chronic chest pain present. No nausea, vomiting, diarrhea, abdominal pain, dysuria, frequency or urgency of urination. No rash. In the emergency room patient was evaluated by the ED physician and a chest x-ray negative for acute pneumonia but positive for lung mass which is old. Lab work shows WBC 6.7, mild anemia, mild elevated LFTs. Urinalysis unremarkable. Patient was started on IV antibiotics namely Vanco and Zosyn after obtaining blood cultures by the ED physician as per patient's oncologist who recommended admission. Hospitalist service was consulted for further management. Patient at the current time states she is feeling slightly better, denies any other new complaints except for mild cough and chronic chest pain.  PAST MEDICAL HISTORY:   Past Medical History  Diagnosis Date  . Breast lump 2006     both breast   . Cancer   . Chest wall mass   . Uterine cancer     PAST SURGICAL HISTORY:   Past Surgical History  Procedure Laterality Date  . Cholecystectomy  2001  . Ankle surgery  2005  . Cesarean section  1993  . Back surgery  2005  . Breast biopsy Bilateral   . Colonoscopy    . Abdominal hysterectomy  01/30/15     SOCIAL HISTORY:   History  Substance Use Topics  . Smoking status: Never Smoker   . Smokeless tobacco: Never Used  . Alcohol Use: No    FAMILY HISTORY:   Family History  Problem Relation Age of Onset  . Breast cancer Sister 51  . Breast cancer Maternal Aunt   . Breast cancer Maternal Grandmother 60  . Stomach cancer Maternal Aunt   . Lung cancer Maternal Grandfather   . Colon polyps Mother   . Cancer - Colon Cousin 55    first cousin  . Lupus Father   . Esophageal cancer Maternal Uncle     DRUG ALLERGIES:   Allergies  Allergen Reactions  . Pravastatin Hives and Nausea And Vomiting  . Sulfa Antibiotics Hives    Other reaction(s): Localized superficial swelling of skin  . Latex Rash  . Other Rash    Spandex    REVIEW OF SYSTEMS:   Review of Systems  Constitutional: Positive for fever and malaise/fatigue. Negative for chills.  HENT: Negative for ear pain, hearing loss, nosebleeds, sore throat and tinnitus.   Eyes: Negative for blurred vision, double vision, pain, discharge and redness.  Respiratory: Positive for cough. Negative for hemoptysis, sputum production, shortness of breath and wheezing.   Cardiovascular: Positive for chest pain. Negative for palpitations, orthopnea and leg swelling.  Gastrointestinal: Negative for nausea, vomiting, abdominal pain, diarrhea, constipation, blood in stool and melena.  Genitourinary: Negative for dysuria, urgency, frequency and hematuria.  Musculoskeletal:  Negative for back pain, joint pain and neck pain.  Skin: Negative for itching and rash.  Neurological: Negative for dizziness, tingling, sensory change, focal weakness and seizures.  Endo/Heme/Allergies: Does not bruise/bleed easily.  Psychiatric/Behavioral: Negative for depression. The patient is not nervous/anxious.     MEDICATIONS AT HOME:   Prior to Admission medications   Medication Sig Start Date End Date Taking? Authorizing Provider  Alum & Mag  Hydroxide-Simeth (MAGIC MOUTHWASH) SOLN Take 5 mLs by mouth 3 (three) times daily as needed for mouth pain (swish and spit).  04/04/15  Yes Historical Provider, MD  cholecalciferol (VITAMIN D) 1000 UNITS tablet Take 1,000 Units by mouth daily.   Yes Historical Provider, MD  dexamethasone (DECADRON) 4 MG tablet Take 2 tabs twice a day  For 3 days with chemo treatment 05/22/15  Yes Lequita Asal, MD  lidocaine-prilocaine (EMLA) cream Apply 1 application topically as needed. Apply one hour prior to chemotherapy 05/01/15  Yes Lequita Asal, MD  loratadine (CLARITIN) 10 MG tablet Take 10 mg by mouth daily.   Yes Historical Provider, MD  ondansetron (ZOFRAN) 8 MG tablet Take by mouth every 8 (eight) hours as needed for nausea or vomiting.   Yes Historical Provider, MD  oxyCODONE-acetaminophen (PERCOCET/ROXICET) 5-325 MG per tablet Take 1 tablet by mouth every 6 (six) hours as needed for severe pain.   Yes Historical Provider, MD      VITAL SIGNS:  Blood pressure 106/74, pulse 96, temperature 98 F (36.7 C), temperature source Oral, resp. rate 21, height 5\' 4"  (1.626 m), weight 79.379 kg (175 lb), SpO2 97 %.  PHYSICAL EXAMINATION:  Physical Exam  Constitutional: She is oriented to person, place, and time. She appears well-developed and well-nourished. No distress.  HENT:  Head: Normocephalic and atraumatic.  Right Ear: External ear normal.  Left Ear: External ear normal.  Nose: Nose normal.  Mouth/Throat: Oropharynx is clear and moist. No oropharyngeal exudate.  Eyes: EOM are normal. Pupils are equal, round, and reactive to light. No scleral icterus.  Neck: Normal range of motion. Neck supple. No JVD present. No thyromegaly present.  Cardiovascular: Normal rate, regular rhythm, normal heart sounds and intact distal pulses.  Exam reveals no friction rub.   No murmur heard. Respiratory: Effort normal and breath sounds normal. No respiratory distress. She has no wheezes. She has no rales. She  exhibits no tenderness.  Port on the left upper chest, site clean.  GI: Soft. Bowel sounds are normal. She exhibits no distension and no mass. There is no tenderness. There is no rebound and no guarding.  Musculoskeletal: Normal range of motion. She exhibits no edema.  Lymphadenopathy:    She has no cervical adenopathy.  Neurological: She is alert and oriented to person, place, and time. She has normal reflexes. She displays normal reflexes. No cranial nerve deficit. She exhibits normal muscle tone.  Skin: Skin is warm. No rash noted. No erythema.  Psychiatric: She has a normal mood and affect. Her behavior is normal. Thought content normal.   LABORATORY PANEL:   CBC  Recent Labs Lab 05/24/15 2342  WBC 6.7  HGB 9.5*  HCT 29.4*  PLT 360   ------------------------------------------------------------------------------------------------------------------  Chemistries   Recent Labs Lab 05/24/15 2342  NA 135  K 4.0  CL 106  CO2 25  GLUCOSE 111*  BUN 11  CREATININE 0.65  CALCIUM 8.5*  AST 66*  ALT 58*  ALKPHOS 72  BILITOT 0.4   ------------------------------------------------------------------------------------------------------------------  Cardiac Enzymes No results for  input(s): TROPONINI in the last 168 hours. ------------------------------------------------------------------------------------------------------------------  RADIOLOGY:  Dg Chest Portable 1 View  05/25/2015   CLINICAL DATA:  Fever and cough  EXAM: PORTABLE CHEST - 1 VIEW  COMPARISON:  03/05/2015  FINDINGS: Subclavian porta catheter from the left, tip at the upper right atrium. Normal heart size and aortic contours. There is known pulmonary metastatic disease which is better evaluated by prior CT. Dominant left chest mass appears increased from prior, nearly 8 cm. There is no edema, consolidation, effusion, or pneumothorax. No acute osseous findings.  IMPRESSION: 1. No evidence of pneumonia. 2. Thoracic  metastatic disease. A left upper lobe metastasis appears increased from 03/05/2015.   Electronically Signed   By: Monte Fantasia M.D.   On: 05/25/2015 00:22    EKG:  No orders found for this or any previous visit.  IMPRESSION AND PLAN:   1. Febrile illness in a patient with history of uterine cancer undergoing chemotherapy, source unclear at this time. Chest x-ray negative for pneumonia, urinalysis unremarkable, chest port site clean. Plan: Admit, continue IV antibiotics, Tylenol when necessary, pain medications as needed, follow-up cultures, CBC. 2. Uterine cancer metastatic to lung, undergoing chemotherapy, last chemotherapy 2 days ago. Patient under care of oncology, Dr. Susy Manor. Oncology consultation requested. 3. Mild elevated LFTs, probably related to chemotherapy. No abdominal symptoms. Monitor LFTs. 4. Anemia, likely anemia of chronic disease. Patient stable. Monitor.    All the records are reviewed and case discussed with ED provider. Management plans discussed with the patient, family and they are in agreement.  CODE STATUS: Full code  TOTAL TIME TAKING CARE OF THIS PATIENT: 45 minutes.    Juluis Mire M.D on 05/25/2015 at 1:52 AM  Between 7am to 6pm - Pager - [phone]  After 6pm go to [email or web address] - [email or web address]  [facility]  Office  [phone]  CC: Primary care physician; Marinda Elk, MD

## 2015-05-25 NOTE — Consult Note (Signed)
[facility] Hematology/Oncology Progress Note  Date of admission: 05/24/2015  Hospital day:  05/25/2015  Chief Complaint: Cassidy Bradley is a 54 y.o. female with metastatic uterine leiomyosarcoma who was admitted with fever.  Subjective: The patient received day 1 of cycle #4 gemcitabine and Taxotere on 05/22/2015.  She tolerated her infusion well.  She states that she developed a fever up to 101.5 yesterday.  She developed chills.  Symptomatically, she has felt fatigued and achy.  She noted some chest pain (mid sternal area) and cough productive of phlegm.  She denied any hemoptysis.  She denied any runny nose, sore throat, change in bowel or bladder function.  She denies any rash except for her left cheek.  She presented to the emergency room last night.  CXR was negative.  Urine was negative.  Given her clinical assessment, decision was made to admit and initiate IV antibiotics.  She has been on Zosyn and vancomycin.  She feels much better today.  Her left cheek remains tender and hot.  Social History: The patient is alone today.  Allergies:  Allergies  Allergen Reactions  . Pravastatin Hives and Nausea And Vomiting  . Sulfa Antibiotics Hives    Other reaction(s): Localized superficial swelling of skin  . Latex Rash  . Other Rash    Spandex   Scheduled Medications: . cholecalciferol  1,000 Units Oral Daily  . enoxaparin (LOVENOX) injection  40 mg Subcutaneous Q24H  . nystatin  5 mL Oral QID  . piperacillin-tazobactam (ZOSYN)  IV  4.5 g Intravenous 3 times per day  . vancomycin  1,250 mg Intravenous Q12H   Review of Systems: GENERAL:  Feels better today.  Active.  Fever curve down.  Chills resolved. Weight stable. PERFORMANCE STATUS (ECOG):  1-2 HEENT:  No visual changes, runny nose, sore throat, mouth sores or tenderness. Lungs: Mid sternal chest discomfort.  Shortness of breath resolved.  Cough.  No hemoptysis. Cardiac:  No chest pain, palpitations,  orthopnea, or PND. GI:  No nausea, vomiting, diarrhea, constipation, melena or hematochezia. GU:  No urgency, frequency, dysuria, or hematuria. Musculoskeletal:  No back pain.  No joint pain.  No muscle tenderness. Extremities:  No pain or swelling. Skin:  Left cheek red tender rash. Neuro:  No headache, numbness or weakness, balance or coordination issues. Endocrine:  No diabetes, thyroid issues, hot flashes or night sweats. Psych:  No mood changes, depression or anxiety. Pain:  No focal pain. Review of systems:  All other systems reviewed and found to be negative.  Physical Exam: Blood pressure 103/58, pulse 97, temperature 98.6 F (37 C), temperature source Oral, resp. rate 18, height $RemoveBe'5\' 4"'CQGizMcVo$  (1.626 m), weight 175 lb 4.8 oz (79.516 kg), SpO2 99 %.  GENERAL:  Well developed, well nourished, sitting comfortably on the medical unit in no acute distress. MENTAL STATUS:  Alert and oriented to person, place and time. HEAD:  Normocephalic, atraumatic, face symmetric, no Cushingoid features. EYES:  Brown eyes.  Pupils equal round and reactive to light and accomodation.  No conjunctivitis or scleral icterus. ENT:  Slight left buccal mucosa thrush.  Tongue normal. Mucous membranes moist.  RESPIRATORY:  Clear to auscultation without rales, wheezes or rhonchi. CARDIOVASCULAR:  Regular rate and rhythm without murmur, rub or gallop. ABDOMEN:  Soft, non-tender, with active bowel sounds, and no hepatosplenomegaly.  No masses. SKIN:  Left cheek abraded rash with increased warmth. EXTREMITIES:  Lower extremity edema.  No palpable cords. LYMPH NODES: No palpable cervical, supraclavicular, axillary  or inguinal adenopathy  NEUROLOGICAL: Unremarkable. PSYCH:  Appropriate.  Results for orders placed or performed during the hospital encounter of 05/24/15 (from the past 48 hour(s))  CBC     Status: Abnormal   Collection Time: 05/24/15 11:42 PM  Result Value Ref Range   WBC 6.7 3.6 - 11.0 K/uL   RBC 3.24  (L) 3.80 - 5.20 MIL/uL   Hemoglobin 9.5 (L) 12.0 - 16.0 g/dL   HCT 29.4 (L) 35.0 - 47.0 %   MCV 90.8 80.0 - 100.0 fL   MCH 29.4 26.0 - 34.0 pg   MCHC 32.4 32.0 - 36.0 g/dL   RDW 17.2 (H) 11.5 - 14.5 %   Platelets 360 150 - 440 K/uL  Comprehensive metabolic panel     Status: Abnormal   Collection Time: 05/24/15 11:42 PM  Result Value Ref Range   Sodium 135 135 - 145 mmol/L   Potassium 4.0 3.5 - 5.1 mmol/L   Chloride 106 101 - 111 mmol/L   CO2 25 22 - 32 mmol/L   Glucose, Bld 111 (H) 65 - 99 mg/dL   BUN 11 6 - 20 mg/dL   Creatinine, Ser 0.65 0.44 - 1.00 mg/dL   Calcium 8.5 (L) 8.9 - 10.3 mg/dL   Total Protein 5.9 (L) 6.5 - 8.1 g/dL   Albumin 3.0 (L) 3.5 - 5.0 g/dL   AST 66 (H) 15 - 41 U/L   ALT 58 (H) 14 - 54 U/L   Alkaline Phosphatase 72 38 - 126 U/L   Total Bilirubin 0.4 0.3 - 1.2 mg/dL   GFR calc non Af Amer >60 >60 mL/min   GFR calc Af Amer >60 >60 mL/min    Comment: (NOTE) The eGFR has been calculated using the CKD EPI equation. This calculation has not been validated in all clinical situations. eGFR's persistently <60 mL/min signify possible Chronic Kidney Disease.    Anion gap 4 (L) 5 - 15  Urinalysis complete, with microscopic [facility])     Status: Abnormal   Collection Time: 05/25/15 12:08 AM  Result Value Ref Range   Color, Urine STRAW (A) YELLOW   APPearance CLEAR (A) CLEAR   Glucose, UA NEGATIVE NEGATIVE mg/dL   Bilirubin Urine NEGATIVE NEGATIVE   Ketones, ur NEGATIVE NEGATIVE mg/dL   Specific Gravity, Urine 1.005 1.005 - 1.030   Hgb urine dipstick 1+ (A) NEGATIVE   pH 6.0 5.0 - 8.0   Protein, ur NEGATIVE NEGATIVE mg/dL   Nitrite NEGATIVE NEGATIVE   Leukocytes, UA NEGATIVE NEGATIVE   RBC / HPF 0-5 0 - 5 RBC/hpf   WBC, UA 0-5 0 - 5 WBC/hpf   Bacteria, UA NONE SEEN NONE SEEN   Squamous Epithelial / LPF 0-5 (A) NONE SEEN  Creatinine, serum     Status: None   Collection Time: 05/25/15  5:15 AM  Result Value Ref Range   Creatinine, Ser 0.64 0.44 - 1.00 mg/dL    GFR calc non Af Amer >60 >60 mL/min   GFR calc Af Amer >60 >60 mL/min    Comment: (NOTE) The eGFR has been calculated using the CKD EPI equation. This calculation has not been validated in all clinical situations. eGFR's persistently <60 mL/min signify possible Chronic Kidney Disease.   CBC     Status: Abnormal   Collection Time: 05/25/15  5:15 AM  Result Value Ref Range   WBC 5.0 3.6 - 11.0 K/uL   RBC 3.02 (L) 3.80 - 5.20 MIL/uL   Hemoglobin 9.0 (L) 12.0 - 16.0  g/dL   HCT 27.4 (L) 35.0 - 47.0 %   MCV 90.8 80.0 - 100.0 fL   MCH 29.7 26.0 - 34.0 pg   MCHC 32.7 32.0 - 36.0 g/dL   RDW 17.5 (H) 11.5 - 14.5 %   Platelets 339 150 - 440 K/uL  Comprehensive metabolic panel     Status: Abnormal   Collection Time: 05/25/15  5:15 AM  Result Value Ref Range   Sodium 137 135 - 145 mmol/L   Potassium 3.9 3.5 - 5.1 mmol/L   Chloride 105 101 - 111 mmol/L   CO2 26 22 - 32 mmol/L   Glucose, Bld 98 65 - 99 mg/dL   BUN 11 6 - 20 mg/dL   Creatinine, Ser 0.60 0.44 - 1.00 mg/dL   Calcium 8.4 (L) 8.9 - 10.3 mg/dL   Total Protein 5.7 (L) 6.5 - 8.1 g/dL   Albumin 2.9 (L) 3.5 - 5.0 g/dL   AST 62 (H) 15 - 41 U/L   ALT 55 (H) 14 - 54 U/L   Alkaline Phosphatase 73 38 - 126 U/L   Total Bilirubin 0.5 0.3 - 1.2 mg/dL   GFR calc non Af Amer >60 >60 mL/min   GFR calc Af Amer >60 >60 mL/min    Comment: (NOTE) The eGFR has been calculated using the CKD EPI equation. This calculation has not been validated in all clinical situations. eGFR's persistently <60 mL/min signify possible Chronic Kidney Disease.    Anion gap 6 5 - 15   Dg Chest Portable 1 View  05/25/2015   CLINICAL DATA:  Fever and cough  EXAM: PORTABLE CHEST - 1 VIEW  COMPARISON:  03/05/2015  FINDINGS: Subclavian porta catheter from the left, tip at the upper right atrium. Normal heart size and aortic contours. There is known pulmonary metastatic disease which is better evaluated by prior CT. Dominant left chest mass appears increased from  prior, nearly 8 cm. There is no edema, consolidation, effusion, or pneumothorax. No acute osseous findings.  IMPRESSION: 1. No evidence of pneumonia. 2. Thoracic metastatic disease. A left upper lobe metastasis appears increased from 03/05/2015.   Electronically Signed   By: Monte Fantasia M.D.   On: 05/25/2015 00:22   Assessment:  STORMI VANDEVELDE is a 54 y.o. female with stage IV uterine leiomyosarcoma. She underwent TAH/BSO on 01/30/2015. Pathology confirmed high grade leiomyosarcoma with 2 large adjacent tumor nodules (10 cm and 5 cm) and a separate 0.5 cm serosal nodule. Chest, abdomen, and pelvic CT scan on 02/12/2015 revealed a 1.7 x 2.3 cm right external iliac node. Pulmonary nodules were scattered throughout the lungs bilaterally. There was left internal mammary chain adenopathy measuring up to 3.6 x 2 cm.   She underwent anterior chest wall pleural-based CT guided biopsy on 02/21/2015. Pathology was consistent with metastatic leiomyosarcoma. Bone scan on 02/28/2015 revealed no evidence of metastatic disease with indeterminate uptake in the left posterior rib.   She has a fraternal twin who developed breast cancer at age 52. Several other family members have malignancies. 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   Prechemotherapy labs from [facility] revealed leukopenia (WBC 2300 with ANC 900). Work-up was negative. She has a family history of leukopenia. Bone marrow on 03/08/2015 revealed no evidence of neoplasia or metastatic disease. Marrow was normocellular for age (30-50%) with trilineage hematopoiesis. There was no increase in marrow reticulin fibers. Flow cytometry and cytogenetics were normal.   She is day 4 status  post cycle #4 gemcitabine and Taxotere (03/20/2015 - 05/22/2015) with Neulasta support. She has a fever with a non-localizing exam except for possible left cheek cellulitis.  She has mild  thrush.  Plan: 1. Continue antibiotics.  Vancomycin level. 2. Nystatin swish and spit. 3. Blood cultures q 24 hours prn temp >= 100.4 4. Chest CT- f/u lung nodules (previously scheduled for today).  Please call if any questions.    Lequita Asal, MD  05/25/2015, 4:34 PM

## 2015-05-25 NOTE — Progress Notes (Signed)
ANTIBIOTIC CONSULT NOTE - INITIAL  Pharmacy Consult for Zosyn Indication: rule out sepsis  Allergies  Allergen Reactions  . Pravastatin Hives and Nausea And Vomiting  . Sulfa Antibiotics Hives    Other reaction(s): Localized superficial swelling of skin  . Latex Rash  . Other Rash    Spandex    Patient Measurements: Height: 5\' 4"  (162.6 cm) Weight: 175 lb 4.8 oz (79.516 kg) IBW/kg (Calculated) : 54.7 Adjusted Body Weight:   Vital Signs: Temp: 98.7 F (37.1 C) (05/27 0234) Temp Source: Oral (05/27 0234) BP: 103/61 mmHg (05/27 0234) Pulse Rate: 99 (05/27 0234) Intake/Output from previous day:   Intake/Output from this shift:    Labs:  Recent Labs  05/24/15 2342  WBC 6.7  HGB 9.5*  PLT 360  CREATININE 0.65   Estimated Creatinine Clearance: 82.9 mL/min (by C-G formula based on Cr of 0.65). No results for input(s): VANCOTROUGH, VANCOPEAK, VANCORANDOM, GENTTROUGH, GENTPEAK, GENTRANDOM, TOBRATROUGH, TOBRAPEAK, TOBRARND, AMIKACINPEAK, AMIKACINTROU, AMIKACIN in the last 72 hours.   Microbiology: No results found for this or any previous visit (from the past 720 hour(s)).  Medical History: Past Medical History  Diagnosis Date  . Breast lump 2006     both breast   . Cancer   . Chest wall mass   . Uterine cancer     Medications:  Scheduled:  . cholecalciferol  1,000 Units Oral Daily  . enoxaparin (LOVENOX) injection  40 mg Subcutaneous Q24H  . piperacillin-tazobactam (ZOSYN)  IV  4.5 g Intravenous 3 times per day  . vancomycin  1,250 mg Intravenous Q12H   Anti-infectives    Start     Dose/Rate Route Frequency Ordered Stop   05/25/15 0600  piperacillin-tazobactam (ZOSYN) IVPB 4.5 g     4.5 g 200 mL/hr over 30 Minutes Intravenous 3 times per day 05/25/15 0305     05/25/15 0600  vancomycin (VANCOCIN) 1,250 mg in sodium chloride 0.9 % 250 mL IVPB     1,250 mg 166.7 mL/hr over 90 Minutes Intravenous Every 12 hours 05/25/15 0305     05/24/15 2330   piperacillin-tazobactam (ZOSYN) IVPB 3.375 g     3.375 g 12.5 mL/hr over 240 Minutes Intravenous  Once 05/24/15 2320 05/25/15 0022   05/24/15 2330  vancomycin (VANCOCIN) IVPB 1000 mg/200 mL premix     1,000 mg 200 mL/hr over 60 Minutes Intravenous  Once 05/24/15 2320 05/25/15 0055     Assessment: Pharmacy consulted to dose zosyn and vancomycin in this 54yo F with history of cancer being treated empicially for possible infection.   BCx: Pending  Goal of Therapy:  Vancomycin trough level 15-20 mcg/ml  Plan:  Zosyn 4.5gm IV Q8hrs ordered due to increased risk for MDR organism. Vancomycin 1250mg  IV Q12hrs to start 6hrs after the 1gm dose given in ED for stacked dosing. Trough ordered for 5/29 at 0530hrs which will be close to steadystate.  Measure antibiotic drug levels at steady state Follow up culture results    Yoel Kaufhold K 05/25/2015,3:06 AM

## 2015-05-26 DIAGNOSIS — T50905A Adverse effect of unspecified drugs, medicaments and biological substances, initial encounter: Secondary | ICD-10-CM | POA: Diagnosis present

## 2015-05-26 MED ORDER — NYSTATIN 100000 UNIT/ML MT SUSP: 5.0000 mL | Freq: Four times a day (QID) | OROMUCOSAL | Status: DC

## 2015-05-26 MED ORDER — AMOXICILLIN-POT CLAVULANATE 875-125 MG PO TABS: 1.0000 | ORAL_TABLET | Freq: Two times a day (BID) | ORAL | Status: DC

## 2015-05-26 MED ORDER — HEPARIN SOD (PORK) LOCK FLUSH 100 UNIT/ML IV SOLN
500.0000 [IU] | Freq: Once | INTRAVENOUS | Status: AC
  Administered 2015-05-26: 13:00:00 500 [IU] via INTRAVENOUS

## 2015-05-26 NOTE — Progress Notes (Signed)
Pt being discharged home at this time, no noted complaints at discharge, discharge and prescriptions reviewed with pt states understanding, per MD cultures negative pt may be discharged

## 2015-05-26 NOTE — Discharge Instructions (Signed)

## 2015-05-26 NOTE — Plan of Care (Signed)
Problem: Discharge Progression Outcomes Goal: Other Discharge Outcomes/Goals Outcome: Progressing Plan of Care Progress To Goal: Hemodynamically: vitals stable, afebrile, IV abx ordered Pt states no problem with appetite    Activity: moderate fall risk, standby assist out of bed    No fall or injury this shift. Will continue to assess.

## 2015-05-27 NOTE — Discharge Summary (Signed)
[place] [facility] [place] NAME: Colbie Sliker    MR#:  [id number]  DATE OF BIRTH:  11-02-1961  DATE OF ADMISSION:  05/24/2015 ADMITTING PHYSICIAN: Juluis Mire, MD  DATE OF DISCHARGE: 05/26/2015  1:22 PM  PRIMARY CARE PHYSICIAN: MCLAUGHLIN, MIRIAM K, MD    ADMISSION DIAGNOSIS:  Febrile illness, acute [R50.9]  DISCHARGE DIAGNOSIS:  Principal Problem:   Sepsis Active Problems:   Uterine leiomyosarcoma   Acute febrile illness   Elevated LFTs   Anemia   Candidiasis of mouth   Pulmonary nodules   Drug reaction   SECONDARY DIAGNOSIS:   Past Medical History  Diagnosis Date  . Breast lump 2006     both breast   . Cancer   . Chest wall mass   . Uterine cancer      ADMITTING HISTORY  Jaielle Dlouhy is a 54 y.o. 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 Patient states that she was not feeling well since yesterday morning and later in the evening noted to have a elevated temperature. Does complain of cough with minimal sputum. Chronic chest pain present. No nausea, vomiting, diarrhea, abdominal pain, dysuria, frequency or urgency of urination. No rash. In the emergency room patient was evaluated by the ED physician and a chest x-ray negative for acute pneumonia but positive for lung mass which is old. Lab work shows WBC 6.7, mild anemia, mild elevated LFTs. Urinalysis unremarkable. Patient was started on IV antibiotics namely Vanco and Zosyn after obtaining blood cultures by the ED physician as per patient's oncologist who recommended admission. Hospitalist service was consulted for further management. Patient at the current time states she is feeling slightly better, denies any other new complaints except for mild cough and chronic chest pain.  HOSPITAL COURSE:   Admitted for fever.  Found to have left cheek redness. It was unclear if this was a drug reaction or cellulitis. She had similar redness with her last round of chemo. Treated with broad spectrum abx. Blood cx negative. DIscussed with oncology. Changed to PO augmentin and discharge. Stable for discharge. No fever in hsopital.   CONSULTS OBTAINED:  Treatment Team:  Evlyn Kanner, NP  DRUG ALLERGIES:   Allergies  Allergen Reactions  . Pravastatin Hives and Nausea And Vomiting  . Sulfa Antibiotics Hives    Other reaction(s): Localized superficial swelling of skin  . Latex Rash  . Other Rash    Spandex    DISCHARGE MEDICATIONS:   Discharge Medication List as of 05/26/2015 10:30 AM    START taking these medications   Details  amoxicillin-clavulanate (AUGMENTIN) 875-125 MG per tablet Take 1 tablet by mouth 2 (two) times daily., Starting 05/26/2015, Until Discontinued, Print    nystatin (MYCOSTATIN) 100000 UNIT/ML suspension Take 5 mLs (500,000 Units total) by mouth 4 (four) times daily., Starting 05/26/2015, Until Discontinued, Print      CONTINUE these medications which have NOT CHANGED   Details  Alum & Mag Hydroxide-Simeth (MAGIC MOUTHWASH) SOLN Take 5 mLs by mouth 3 (three) times daily as needed for mouth pain (swish and spit). , Starting 04/04/2015, Until Discontinued, Historical Med    cholecalciferol (VITAMIN D) 1000 UNITS tablet Take 1,000 Units by mouth daily., Until Discontinued, Historical Med    dexamethasone (DECADRON) 4 MG tablet Take 2 tabs twice a day  For 3 days with  chemo treatment, Normal    lidocaine-prilocaine (EMLA) cream Apply 1 application topically as needed. Apply one hour prior to chemotherapy, Starting 05/01/2015, Until Discontinued, Normal    loratadine (CLARITIN) 10 MG tablet Take 10 mg by mouth daily., Until Discontinued, Historical Med    ondansetron (ZOFRAN) 8 MG tablet Take by mouth every 8 (eight) hours as needed for nausea or vomiting., Until Discontinued, Historical Med     oxyCODONE-acetaminophen (PERCOCET/ROXICET) 5-325 MG per tablet Take 1 tablet by mouth every 6 (six) hours as needed for severe pain., Until Discontinued, Historical Med         Today    VITAL SIGNS:  Blood pressure 95/55, pulse 91, temperature 98.1 F (36.7 C), temperature source Oral, resp. rate 20, height 5\' 4"  (1.626 m), weight 79.516 kg (175 lb 4.8 oz), SpO2 100 %.  I/O:  No intake or output data in the 24 hours ending 05/27/15 2210  PHYSICAL EXAMINATION:  Physical Exam  GENERAL:  54 y.o.-year-old patient lying in the bed with no acute distress.  LUNGS: Normal breath sounds bilaterally, no wheezing, rales,rhonchi or crepitation. No use of accessory muscles of respiration.  CARDIOVASCULAR: S1, S2 normal. No murmurs, rubs, or gallops.  ABDOMEN: Soft, non-tender, non-distended. Bowel sounds present. No organomegaly or mass.  NEUROLOGIC: Moves all 4 extremities. PSYCHIATRIC: The patient is alert and oriented x 3.  SKIN: No obvious rash, lesion, or ulcer.   DATA REVIEW:   CBC  Recent Labs Lab 05/25/15 0515  WBC 5.0  HGB 9.0*  HCT 27.4*  PLT 339    Chemistries   Recent Labs Lab 05/25/15 0515  NA 137  K 3.9  CL 105  CO2 26  GLUCOSE 98  BUN 11  CREATININE 0.64  0.60  CALCIUM 8.4*  AST 62*  ALT 55*  ALKPHOS 73  BILITOT 0.5    Cardiac Enzymes No results for input(s): TROPONINI in the last 168 hours.  Microbiology Results  Results for orders placed or performed during the hospital encounter of 05/24/15  Blood culture (routine x 2)     Status: None (Preliminary result)   Collection Time: 05/24/15 11:42 PM  Result Value Ref Range Status   Specimen Description BLOOD  Final   Special Requests NONE  Final   Culture NO GROWTH 2 DAYS  Final   Report Status PENDING  Incomplete  Blood culture (routine x 2)     Status: None (Preliminary result)   Collection Time: 05/24/15 11:42 PM  Result Value Ref Range Status   Specimen Description BLOOD  Final    Special Requests NONE  Final   Culture NO GROWTH 2 DAYS  Final   Report Status PENDING  Incomplete    RADIOLOGY:  No results found.    Management plans discussed with the patient, family and they are in agreement.  CODE STATUS: FULL CODE  TOTAL TIME TAKING CARE OF THIS PATIENT ON DAY OF DISCHARGE: 40 minutes.    [name] R M.D on 05/27/2015 at 10:10 PM  Between 7am to 6pm - Pager - [phone]  After 6pm go to [email or web address] - password [email or web address] [facility]  [facility]  Office  [phone]  CC: Primary care physician; Marinda Elk, MD

## 2015-05-29 ENCOUNTER — Other Ambulatory Visit: Payer: 59

## 2015-05-29 ENCOUNTER — Inpatient Hospital Stay: Payer: 59

## 2015-05-29 ENCOUNTER — Inpatient Hospital Stay (HOSPITAL_BASED_OUTPATIENT_CLINIC_OR_DEPARTMENT_OTHER): Payer: 59 | Admitting: Hematology and Oncology

## 2015-05-29 VITALS — BP 147/85 | HR 106 | Temp 97.8°F | Ht 64.0 in | Wt 180.6 lb

## 2015-05-29 DIAGNOSIS — Z800 Family history of malignant neoplasm of digestive organs: Secondary | ICD-10-CM | POA: Diagnosis not present

## 2015-05-29 DIAGNOSIS — Z5111 Encounter for antineoplastic chemotherapy: Secondary | ICD-10-CM | POA: Diagnosis not present

## 2015-05-29 DIAGNOSIS — Z803 Family history of malignant neoplasm of breast: Secondary | ICD-10-CM | POA: Diagnosis not present

## 2015-05-29 DIAGNOSIS — C55 Malignant neoplasm of uterus, part unspecified: Secondary | ICD-10-CM

## 2015-05-29 DIAGNOSIS — Z79899 Other long term (current) drug therapy: Secondary | ICD-10-CM

## 2015-05-29 LAB — COMPREHENSIVE METABOLIC PANEL
ALT: 82 U/L — ABNORMAL HIGH (ref 14–54)
AST: 86 U/L — ABNORMAL HIGH (ref 15–41)
Albumin: 3.4 g/dL — ABNORMAL LOW (ref 3.5–5.0)
Alkaline Phosphatase: 111 U/L (ref 38–126)
Anion gap: 5 (ref 5–15)
BUN: 10 mg/dL (ref 6–20)
CO2: 24 mmol/L (ref 22–32)
Calcium: 9 mg/dL (ref 8.9–10.3)
Chloride: 109 mmol/L (ref 101–111)
Creatinine, Ser: 0.55 mg/dL (ref 0.44–1.00)
GFR calc Af Amer: >60 mL/min (ref >60)
GFR calc non Af Amer: >60 mL/min (ref >60)
Glucose, Bld: 139 mg/dL — ABNORMAL HIGH (ref 65–99)
Potassium: 3.6 mmol/L (ref 3.5–5.1)
Sodium: 138 mmol/L (ref 135–145)
Total Bilirubin: 0.6 mg/dL (ref 0.3–1.2)
Total Protein: 6.6 g/dL (ref 6.5–8.1)

## 2015-05-29 LAB — CBC
HCT: 29.5 % — ABNORMAL LOW (ref 35.0–47.0)
Hemoglobin: 9.8 g/dL — ABNORMAL LOW (ref 12.0–16.0)
MCH: 30 pg (ref 26.0–34.0)
MCHC: 33.1 g/dL (ref 32.0–36.0)
MCV: 90.6 fL (ref 80.0–100.0)
Platelets: 333 10*3/uL (ref 150–440)
RBC: 3.26 MIL/uL — ABNORMAL LOW (ref 3.80–5.20)
RDW: 17.6 % — ABNORMAL HIGH (ref 11.5–14.5)
WBC: 9.3 10*3/uL (ref 3.6–11.0)

## 2015-05-29 LAB — MAGNESIUM: Magnesium: 1.9 mg/dL (ref 1.7–2.4)

## 2015-05-29 NOTE — Progress Notes (Signed)
[facility] day:  05/29/2015  Chief Complaint: Cassidy Bradley is an 54 y.o. female with metastatic uterine leiomyosarcoma who is seen for reassessment after interval hospitalization and review of interval chest CT prior to day 8 of cycle #4 gemcitabine and Taxotere   HPI: The patient was last seen in the medical oncology clinic on 05/22/2015.  At that time,  She was seen for assessment prior to cycle #4 gemcitabine and Taxotere.  She received day 1 gemcitabine uneventfully.  The patient was admitted to [facility] from 05/18/2015 until 05/26/2015 with an acute febrile illness.  She presented with a fever of 101.5 and not feeling well. She also had some chest discomfort to her. Chest x-ray, urinalysis, and blood cultures were negative. She was begun on empiric vancomycin and Zosyn. On exam she had left cheek redness with increased warmth and tenderness possibly representing representing cellulitis. She was discharged home on Augmentin. She has had no recurrent fevers.  She notes that her thighs still bother her.  Legs feel tired when she gets up. She notes some edema in her legs. Her chest discomfort is better. Her neuropathy is better.  While hospitalized the patient underwent a chest CT 05/25/2015. The there has been progression from the 02/19/2015 scan. The left upper lobe dominant mass currently measures 6.4 x 5.5 x 5.7 cm and invades the left anterior mediastinum and extends to abut the overlying left anterior first and second ribs.  Several smaller nodules are mostly stable some slight enlargement of a right upper lobe nodule from 12.4 mm to 15 mm and a left upper lobe nodule enlarging from 5.8 to 7.4 mm.  Past Medical History  Diagnosis Date  . Breast lump 2006     both breast   . Cancer   . Chest wall mass   . Uterine cancer    Past Surgical History  Procedure Laterality Date  . Cholecystectomy  2001  . Ankle surgery   2005  . Cesarean section  1993  . Back surgery  2005  . Breast biopsy Bilateral   . Colonoscopy    . Abdominal hysterectomy  01/30/15   Family History  Problem Relation Age of Onset  . Breast cancer Sister 6  . Breast cancer Maternal Aunt   . Breast cancer Maternal Grandmother 60  . Stomach cancer Maternal Aunt   . Lung cancer Maternal Grandfather   . Colon polyps Mother   . Cancer - Colon Cousin 22    first cousin  . Lupus Father   . Esophageal cancer Maternal Uncle    Social History:  reports that she has never smoked. She has never used smokeless tobacco. She reports that she does not drink alcohol or use illicit drugs.  The patient is accompanied by her husband.  Allergies:  Allergies  Allergen Reactions  . Pravastatin Hives and Nausea And Vomiting  . Sulfa Antibiotics Hives    Other reaction(s): Localized superficial swelling of skin  . Latex Rash  . Other Rash    Spandex   Current Medications: Current Outpatient Prescriptions  Medication Sig Dispense Refill  . Alum & Mag Hydroxide-Simeth (MAGIC MOUTHWASH) SOLN Take 5 mLs by mouth 3 (three) times daily as needed for mouth pain (swish and spit).   0  . amoxicillin-clavulanate (AUGMENTIN) 875-125 MG per tablet Take 1 tablet by mouth 2 (two) times daily. 10 tablet 0  . cholecalciferol (VITAMIN D) 1000 UNITS tablet Take 1,000 Units  by mouth daily.    Marland Kitchen dexamethasone (DECADRON) 4 MG tablet Take 2 tabs twice a day  For 3 days with chemo treatment 10 tablet 1  . lidocaine-prilocaine (EMLA) cream Apply 1 application topically as needed. Apply one hour prior to chemotherapy 30 g 1  . loratadine (CLARITIN) 10 MG tablet Take 10 mg by mouth daily.    Marland Kitchen nystatin (MYCOSTATIN) 100000 UNIT/ML suspension Take 5 mLs (500,000 Units total) by mouth 4 (four) times daily. 100 mL 0  . ondansetron (ZOFRAN) 8 MG tablet Take by mouth every 8 (eight) hours as needed for nausea or vomiting.    Marland Kitchen oxyCODONE-acetaminophen (PERCOCET/ROXICET) 5-325 MG  per tablet Take 1 tablet by mouth every 6 (six) hours as needed for severe pain.     No current facility-administered medications for this visit.   Facility-Administered Medications Ordered in Other Visits  Medication Dose Route Frequency Provider Last Rate Last Dose  . sodium chloride 0.9 % injection 10 mL  10 mL Intracatheter PRN Lequita Asal, MD       Review of Systems:  GENERAL:  Feels better.  Active.  No fevers, sweats or weight loss. PERFORMANCE STATUS (ECOG):  1 HEENT:  No visual changes, runny nose, sore throat, mouth sores or tenderness. Lungs: No shortness of breath or cough.  No hemoptysis. Cardiac:  Chest pain improved.  No palpitations, orthopnea, or PND. GI:  Constipation.  Using Miralax. No nausea, vomiting, diarrhea, melena or hematochezia. GU:  No urgency, frequency, dysuria, or hematuria. Musculoskeletal:  No back pain.  No joint pain.  No muscle tenderness. Extremities:  Legs feel tired when getting up.  Swelling in legs (right > left). Skin:  No rashes or skin changes. Neuro:  Neuropathy in fingertips better.  No headache, numbness or weakness, balance or coordination issues. Endocrine:  No diabetes, thyroid issues, hot flashes or night sweats. Psych:  No mood changes, depression or anxiety. Pain:  No focal pain. Review of systems:  All other systems reviewed and found to be negative.   Physical Exam: Blood pressure 147/85, pulse 106, temperature 97.8 F (36.6 C), temperature source Oral, height $RemoveBefo'5\' 4"'TJrzswrAeeM$  (1.626 m), weight 180 lb 8.9 oz (81.9 kg). GENERAL:  Well developed, well nourished woman sitting comfortably in the exam room in no acute distress. MENTAL STATUS:  Alert and oriented to person, place and time. HEAD:  Wearing a black cap.  Alopecia totalis. Normocephalic, atraumatic, face symmetric, no Cushingoid features. EYES:  Brown eyes.  Pupils equal round and reactive to light and accomodation.  No conjunctivitis or scleral icterus. ENT:  Oropharynx clear  without lesion.  Tongue normal. Mucous membranes moist. Resolution of thrush (seen during hospitalization). RESPIRATORY:  Clear to auscultation without rales, wheezes or rhonchi. CARDIOVASCULAR:  Regular rate and rhythm without murmur, rub or gallop. ABDOMEN:  Soft, non-tender, with active bowel sounds, and no hepatosplenomegaly.  No masses. SKIN:  Nail changes due to Taxotere.  Left cheek slightly ruddy without increased warmth or tenderness. EXTREMITIES: Bilateral lower extremity edema extending into thighs (right 1+, left trace).  No palpable cords. LYMPH NODES: No palpable cervical, supraclavicular, axillary or inguinal adenopathy  NEUROLOGICAL: Unremarkable. PSYCH:  Appropriate.   Appointment on 05/29/2015  Component Date Value Ref Range Status  . WBC 05/29/2015 9.3  3.6 - 11.0 K/uL Final  . RBC 05/29/2015 3.26* 3.80 - 5.20 MIL/uL Final  . Hemoglobin 05/29/2015 9.8* 12.0 - 16.0 g/dL Final  . HCT 05/29/2015 29.5* 35.0 - 47.0 % Final  . MCV  05/29/2015 90.6  80.0 - 100.0 fL Final  . MCH 05/29/2015 30.0  26.0 - 34.0 pg Final  . MCHC 05/29/2015 33.1  32.0 - 36.0 g/dL Final  . RDW 05/29/2015 17.6* 11.5 - 14.5 % Final  . Platelets 05/29/2015 333  150 - 440 K/uL Final  . Sodium 05/29/2015 138  135 - 145 mmol/L Final  . Potassium 05/29/2015 3.6  3.5 - 5.1 mmol/L Final  . Chloride 05/29/2015 109  101 - 111 mmol/L Final  . CO2 05/29/2015 24  22 - 32 mmol/L Final  . Glucose, Bld 05/29/2015 139* 65 - 99 mg/dL Final  . BUN 05/29/2015 10  6 - 20 mg/dL Final  . Creatinine, Ser 05/29/2015 0.55  0.44 - 1.00 mg/dL Final  . Calcium 05/29/2015 9.0  8.9 - 10.3 mg/dL Final  . Total Protein 05/29/2015 6.6  6.5 - 8.1 g/dL Final  . Albumin 05/29/2015 3.4* 3.5 - 5.0 g/dL Final  . AST 05/29/2015 86* 15 - 41 U/L Final  . ALT 05/29/2015 82* 14 - 54 U/L Final  . Alkaline Phosphatase 05/29/2015 111  38 - 126 U/L Final  . Total Bilirubin 05/29/2015 0.6  0.3 - 1.2 mg/dL Final  . GFR calc non Af Amer 05/29/2015  >60  >60 mL/min Final  . GFR calc Af Amer 05/29/2015 >60  >60 mL/min Final   Comment: (NOTE) The eGFR has been calculated using the CKD EPI equation. This calculation has not been validated in all clinical situations. eGFR's persistently <60 mL/min signify possible Chronic Kidney Disease.   . Anion gap 05/29/2015 5  5 - 15 Final  . Magnesium 05/29/2015 1.9  1.7 - 2.4 mg/dL Final   Assessment:  Cassidy Bradley is an 54 y.o. female with stage IV uterine leiomyosarcoma. She underwent TAH/BSO on 01/30/2015. Pathology confirmed high grade leiomyosarcoma with 2 large adjacent tumor nodules (10 cm and 5 cm) and a separate 0.5 cm serosal nodule.   Chest, abdomen, and pelvic CT scan on 02/12/2015 revealed a 1.7 x 2.3 cm right external iliac node. Pulmonary nodules were scattered throughout the lungs bilaterally. There was left internal mammary chain adenopathy measuring up to 3.6 x 2 cm.  She underwent anterior chest wall pleural-based CT guided biopsy on 02/21/2015. Pathology was consistent with metastatic leiomyosarcoma. Bone scan on 02/28/2015 revealed no evidence of metastatic disease with indeterminate uptake in the left posterior rib.   She has a fraternal twin who developed breast cancer at age 30. Several other family members have malignancies. 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  Prechemotherapy labs from [facility] revealed leukopenia (WBC 2300 with ANC 900). Work-up to date is negative. She has a family history of leukopenia. Bone marrow on 03/08/2015 revealed no evidence of neoplasia or metastatic disease. Marrow was normocellular for age (30-50%) with trilineage hematopoiesis. There was no increase in marrow reticulin fibers. Flow cytometry and cytogenetics were normal.  She is currently day 8 of cycle #4 gemcitabine and Taxotere (03/20/2015 - 05/22/2015) with Neulasta support. She was admitted on  05/24/2015 with an acute febrile illness.  All cultures were negative.  Chest CT on 05/25/2015 revealed early progressive disease.    Plan: 1. Labs today:  CBC with diff, CMP, Mg. 2. No chemotherapy secondary to progression. 3. Discuss second line therapy (adriamycin). 4. Schedule echocardiogram- assess EF 5. Bilateral lower extremity duplex- r/o DVT 6. Abdomen and pelvic CT-  assess extent of disease. 7. Continue Augmentin 8. Present at  tumor board on 05/24/2015. 9. RTC after studies to finalize treatment plan.  Lequita Asal, MD  05/29/2015, 11:42 AM

## 2015-05-30 ENCOUNTER — Ambulatory Visit: Payer: 59

## 2015-05-30 ENCOUNTER — Ambulatory Visit
Admission: RE | Admit: 2015-05-30 | Discharge: 2015-05-30 | Disposition: A | Discharge status: Home / Self Care | Payer: 59 | Source: Ambulatory Visit | Attending: Hematology and Oncology | Admitting: Hematology and Oncology

## 2015-05-30 ENCOUNTER — Ambulatory Visit: Admission: RE | Admit: 2015-05-30 | Payer: 59 | Source: Ambulatory Visit

## 2015-05-30 DIAGNOSIS — C55 Malignant neoplasm of uterus, part unspecified: Secondary | ICD-10-CM

## 2015-05-30 DIAGNOSIS — R609 Edema, unspecified: Secondary | ICD-10-CM | POA: Diagnosis not present

## 2015-05-30 DIAGNOSIS — R911 Solitary pulmonary nodule: Secondary | ICD-10-CM | POA: Diagnosis not present

## 2015-05-30 DIAGNOSIS — R590 Localized enlarged lymph nodes: Secondary | ICD-10-CM | POA: Insufficient documentation

## 2015-05-30 LAB — CULTURE, BLOOD (ROUTINE X 2)
Culture: NO GROWTH
Culture: NO GROWTH

## 2015-05-30 MED ORDER — IOHEXOL 300 MG/ML  SOLN
100.0000 mL | Freq: Once | INTRAMUSCULAR | Status: AC | PRN
  Administered 2015-05-30: 100 mL via INTRAVENOUS

## 2015-05-31 ENCOUNTER — Encounter: Payer: Self-pay | Admitting: Hematology and Oncology

## 2015-05-31 ENCOUNTER — Ambulatory Visit: Payer: 59 | Admitting: Hematology and Oncology

## 2015-05-31 ENCOUNTER — Ambulatory Visit
Admission: RE | Admit: 2015-05-31 | Discharge: 2015-05-31 | Disposition: A | Discharge status: Home / Self Care | Payer: 59 | Source: Ambulatory Visit | Attending: Hematology and Oncology | Admitting: Hematology and Oncology

## 2015-05-31 DIAGNOSIS — C55 Malignant neoplasm of uterus, part unspecified: Secondary | ICD-10-CM

## 2015-05-31 NOTE — Progress Notes (Signed)
*  PRELIMINARY RESULTS* Echocardiogram 2D Echocardiogram has been performed.  [name] 05/31/2015, 10:59 AM

## 2015-06-01 ENCOUNTER — Encounter (INDEPENDENT_AMBULATORY_CARE_PROVIDER_SITE_OTHER): Payer: Self-pay

## 2015-06-01 ENCOUNTER — Inpatient Hospital Stay: Payer: 59 | Attending: Hematology and Oncology | Admitting: Hematology and Oncology

## 2015-06-01 VITALS — BP 119/84 | HR 116 | Temp 98.5°F | Ht 64.0 in | Wt 183.0 lb

## 2015-06-01 DIAGNOSIS — C55 Malignant neoplasm of uterus, part unspecified: Secondary | ICD-10-CM | POA: Insufficient documentation

## 2015-06-01 DIAGNOSIS — Z800 Family history of malignant neoplasm of digestive organs: Secondary | ICD-10-CM | POA: Diagnosis not present

## 2015-06-01 DIAGNOSIS — Z418 Encounter for other procedures for purposes other than remedying health state: Secondary | ICD-10-CM | POA: Diagnosis not present

## 2015-06-01 DIAGNOSIS — C799 Secondary malignant neoplasm of unspecified site: Secondary | ICD-10-CM | POA: Diagnosis not present

## 2015-06-01 DIAGNOSIS — Z5111 Encounter for antineoplastic chemotherapy: Secondary | ICD-10-CM | POA: Diagnosis not present

## 2015-06-01 DIAGNOSIS — Z803 Family history of malignant neoplasm of breast: Secondary | ICD-10-CM

## 2015-06-01 DIAGNOSIS — R600 Localized edema: Secondary | ICD-10-CM | POA: Diagnosis not present

## 2015-06-01 DIAGNOSIS — Z79899 Other long term (current) drug therapy: Secondary | ICD-10-CM | POA: Diagnosis not present

## 2015-06-01 DIAGNOSIS — Z801 Family history of malignant neoplasm of trachea, bronchus and lung: Secondary | ICD-10-CM

## 2015-06-01 NOTE — Progress Notes (Signed)
Pt here today for follow up regarding uterine cancer; c/o thighs feeling aching

## 2015-06-04 ENCOUNTER — Telehealth: Payer: Self-pay | Admitting: *Deleted

## 2015-06-04 ENCOUNTER — Other Ambulatory Visit: Payer: 59

## 2015-06-04 NOTE — Telephone Encounter (Signed)
Patient called. She has not heard anything about her prior authorization for her adriamycin.  She was "supposed to start this week." Her caseworker explained that "[employer] still does not have an authorization from our clinic"  I checked with Luann in cancer center insurance/prior auth. Confirmed request received for prior auth on Friday at 1630. Explained to patient that Otho Perl is presently working on the prior [place].  Patient can call back tomorrow for followup. Pt appreciated call back.

## 2015-06-05 ENCOUNTER — Encounter: Payer: Self-pay | Admitting: Hematology and Oncology

## 2015-06-05 ENCOUNTER — Telehealth: Payer: Self-pay | Admitting: *Deleted

## 2015-06-05 NOTE — Telephone Encounter (Signed)
Lab slip for [facility]:  CBC, CMP. No steroids need to be taken. I will discuss with her how she may use steroids for nausea if needed after chemo.  M

## 2015-06-05 NOTE — Progress Notes (Signed)
[place] [place] [facility] day:  06/01/2015  Chief Complaint: Cassidy Bradley is an 54 y.o. female with metastatic uterine leiomyosarcoma who is seen for review of interval studies after tumor board and discussion regarding direction of therapy.  HPI: The patient was last seen in the medical oncology clinic on 05/29/2015.  At that time, she was seen in view of interval chest CT prior to day 8 of cycle #4 gemcitabine and Taxotere.  Chest CT had revealed aggressive disease. We discussed no further gemcitabine and Taxotere. I discuss second line therapy consisting of single agent Adriamycin.  I discussed a baseline echocardiogram. In addition an abdominal and pelvic CT scan were scheduled to assess her prior pelvic disease. Bilateral lower extremity duplex were ordered secondary to her edema to rule out DVT.  Lower extremity duplex on 05/30/2015 revealed no evidence of DVT. Abdominal and pelvic CT scan on 05/30/2015 revealed stable to slightly increased right external iliac lymph node (1.8 x 2.5 cm). Echocardiogram on 06/18/2015 revealed ejection fraction of 60-65%.  Symptomatically, she denies any new complaints.  Past Medical History  Diagnosis Date  . Breast lump 2006     both breast   . Chest wall mass   . Cancer   . Uterine cancer     Past Surgical History  Procedure Laterality Date  . Cholecystectomy  2001  . Ankle surgery  2005  . Cesarean section  1993  . Back surgery  2005  . Breast biopsy Bilateral   . Colonoscopy    . Abdominal hysterectomy  01/30/15   Family History  Problem Relation Age of Onset  . Breast cancer Sister 14  . Breast cancer Maternal Aunt   . Breast cancer Maternal Grandmother 60  . Stomach cancer Maternal Aunt   . Lung cancer Maternal Grandfather   . Colon polyps Mother   . Cancer - Colon Cousin 48    first cousin  . Lupus Father   . Esophageal cancer Maternal Uncle     Social History:  reports that she has never  smoked. She has never used smokeless tobacco. She reports that she does not drink alcohol or use illicit drugs.  The patient is accompanied by her husband and son.  Allergies:  Allergies  Allergen Reactions  . Pravastatin Hives and Nausea And Vomiting  . Sulfa Antibiotics Hives    Other reaction(s): Localized superficial swelling of skin  . Latex Rash  . Other Rash    Spandex   Current Medications: Current Outpatient Prescriptions  Medication Sig Dispense Refill  . Alum & Mag Hydroxide-Simeth (MAGIC MOUTHWASH) SOLN Take 5 mLs by mouth 3 (three) times daily as needed for mouth pain (swish and spit).   0  . cholecalciferol (VITAMIN D) 1000 UNITS tablet Take 1,000 Units by mouth daily.    Marland Kitchen dexamethasone (DECADRON) 4 MG tablet Take 2 tabs twice a day  For 3 days with chemo treatment 10 tablet 1  . lidocaine-prilocaine (EMLA) cream Apply 1 application topically as needed. Apply one hour prior to chemotherapy 30 g 1  . loratadine (CLARITIN) 10 MG tablet Take 10 mg by mouth daily.    Marland Kitchen nystatin (MYCOSTATIN) 100000 UNIT/ML suspension Take 5 mLs (500,000 Units total) by mouth 4 (four) times daily. 100 mL 0  . ondansetron (ZOFRAN) 8 MG tablet Take by mouth every 8 (eight) hours as needed for nausea or vomiting.    Marland Kitchen oxyCODONE-acetaminophen (PERCOCET/ROXICET) 5-325 MG per tablet Take 1 tablet by  mouth every 6 (six) hours as needed for severe pain.    Marland Kitchen amoxicillin-clavulanate (AUGMENTIN) 875-125 MG per tablet Take 1 tablet by mouth 2 (two) times daily. (Patient not taking: Reported on 06/01/2015) 10 tablet 0   No current facility-administered medications for this visit.   Facility-Administered Medications Ordered in Other Visits  Medication Dose Route Frequency Provider Last Rate Last Dose  . sodium chloride 0.9 % injection 10 mL  10 mL Intracatheter PRN Lequita Asal, MD       Review of Systems:  GENERAL:  Feels ok.  No fevers, sweats or weight loss. PERFORMANCE STATUS (ECOG):  1 HEENT:   No visual changes, runny nose, sore throat, mouth sores or tenderness. Lungs: No shortness of breath or cough.  No hemoptysis. Cardiac:  No chest pain, palpitations, orthopnea, or PND. GI:  No nausea, vomiting, diarrhea, constipation, melena or hematochezia. GU:  No urgency, frequency, dysuria, or hematuria. Musculoskeletal:  No back pain.  No joint pain.  No muscle tenderness. Extremities:  Swelling in legs. Skin:  No rashes or skin changes. Neuro:  No headache, numbness or weakness, balance or coordination issues. Endocrine:  No diabetes, thyroid issues, hot flashes or night sweats. Psych:  No mood changes, depression or anxiety. Pain:  No focal pain. Review of systems:  All other systems reviewed and found to be negative.                        Physical Exam: Blood pressure 119/84, pulse 116, temperature 98.5 F (36.9 C), temperature source Tympanic, height $RemoveBeforeDE'5\' 4"'OGCSpbJvFoLiCWv$  (1.626 m), weight 182 lb 15.7 oz (83 kg).  GENERAL:  Well developed, well nourished, sitting comfortably in the exam room in no acute distress. MENTAL STATUS:  Alert and oriented to person, place and time. HEAD:  Alopecia totalis.  Normocephalic, atraumatic, face symmetric, no Cushingoid features. EYES:  Brown eyes.  No conjunctivitis or scleral icterus. PSYCH:  Appropriate.             No visits with results within 3 Day(s) from this visit. Latest known visit with results is:  Appointment on 05/29/2015  Component Date Value Ref Range Status  . WBC 05/29/2015 9.3  3.6 - 11.0 K/uL Final  . RBC 05/29/2015 3.26* 3.80 - 5.20 MIL/uL Final  . Hemoglobin 05/29/2015 9.8* 12.0 - 16.0 g/dL Final  . HCT 05/29/2015 29.5* 35.0 - 47.0 % Final  . MCV 05/29/2015 90.6  80.0 - 100.0 fL Final  . MCH 05/29/2015 30.0  26.0 - 34.0 pg Final  . MCHC 05/29/2015 33.1  32.0 - 36.0 g/dL Final  . RDW 05/29/2015 17.6* 11.5 - 14.5 % Final  . Platelets 05/29/2015 333  150 - 440 K/uL Final  . Sodium 05/29/2015 138  135 - 145 mmol/L Final  .  Potassium 05/29/2015 3.6  3.5 - 5.1 mmol/L Final  . Chloride 05/29/2015 109  101 - 111 mmol/L Final  . CO2 05/29/2015 24  22 - 32 mmol/L Final  . Glucose, Bld 05/29/2015 139* 65 - 99 mg/dL Final  . BUN 05/29/2015 10  6 - 20 mg/dL Final  . Creatinine, Ser 05/29/2015 0.55  0.44 - 1.00 mg/dL Final  . Calcium 05/29/2015 9.0  8.9 - 10.3 mg/dL Final  . Total Protein 05/29/2015 6.6  6.5 - 8.1 g/dL Final  . Albumin 05/29/2015 3.4* 3.5 - 5.0 g/dL Final  . AST 05/29/2015 86* 15 - 41 U/L Final  . ALT 05/29/2015 82* 14 - 54  U/L Final  . Alkaline Phosphatase 05/29/2015 111  38 - 126 U/L Final  . Total Bilirubin 05/29/2015 0.6  0.3 - 1.2 mg/dL Final  . GFR calc non Af Amer 05/29/2015 >60  >60 mL/min Final  . GFR calc Af Amer 05/29/2015 >60  >60 mL/min Final   Comment: (NOTE) The eGFR has been calculated using the CKD EPI equation. This calculation has not been validated in all clinical situations. eGFR's persistently <60 mL/min signify possible Chronic Kidney Disease.   . Anion gap 05/29/2015 5  5 - 15 Final  . Magnesium 05/29/2015 1.9  1.7 - 2.4 mg/dL Final   Assessment:  Cassidy Bradley is a 54 y.o. female 54 y.o. female with stage IV uterine leiomyosarcoma. She underwent TAH/BSO on 01/30/2015. Pathology confirmed high grade leiomyosarcoma with 2 large adjacent tumor nodules (10 cm and 5 cm) and a separate 0.5 cm serosal nodule.   Chest, abdomen, and pelvic CT scan on 02/12/2015 revealed a 1.7 x 2.3 cm right external iliac node. Pulmonary nodules were scattered throughout the lungs bilaterally. There was left internal mammary chain adenopathy measuring up to 3.6 x 2 cm.  She underwent anterior chest wall pleural-based CT guided biopsy on 02/21/2015. Pathology was consistent with metastatic leiomyosarcoma. Bone scan on 02/28/2015 revealed no evidence of metastatic disease with indeterminate uptake in the left posterior rib.   She has a fraternal twin who developed breast cancer at age 19.  Several other family members have malignancies. 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  Prechemotherapy labs from [facility] revealed leukopenia (WBC 2300 with ANC 900). Work-up to date is negative. She has a family history of leukopenia. Bone marrow on 03/08/2015 revealed no evidence of neoplasia or metastatic disease. Marrow was normocellular for age (30-50%) with trilineage hematopoiesis. There was no increase in marrow reticulin fibers. Flow cytometry and cytogenetics were normal.  She is currently day 11 of cycle #4 gemcitabine and Taxotere (03/20/2015 - 05/22/2015) with Neulasta support. She received gemcitabine alone on 05/22/2015.  She was admitted on 05/24/2015 with an acute febrile illness. All cultures were negative.   Chest CT on 05/25/2015 revealed early progressive disease. Abdominal and pelvic CT scan on 05/30/2015 revealed stable to slightly increased right external iliac lymph node (1.8 x 2.5 cm). Echocardiogram on 06/18/2015 revealed ejection fraction of 60-65%.  Plan: 1.  Review imaging studies (lower extremity duplex and abdominal and pelvic CT scan) as well as echocardiogram. 2.  Discuss conversations at tumor board and with Dr. Fransisca Connors. There has been clear progression of disease. I discussed discontinuation of Taxotere and gemcitabine. I discussed single agent doxorubicin (Adriamycin) and combination therapy with Adriamycin. There is no increased benefit with the combination therapy but increased toxicity. Response rates are estimated at 18% with stable disease in approximately 30%.  Side effects of Adriamycin were reviewed including myelosuppression, nausea, and alopecia. Given her baseline low counts, she will be require Neulasta support. I discussed treatment every 3 weeks until progression or toxicity. We discussed issues regarding cardiac function and monitoring with follow-up  echocardiograms. I briefly discussed other agents as third line therapy. I also discussed a phase I clinical trial at [facility] for 3rd line therapy. 3.  Preauth doxorubicin 60-75mg /m IV every 3 weeks with Neulasta support 4.  Return to clinic next week for MD assessment, labs (CBC, CMP) and cycle #1 doxorubicin.  Lequita Asal, MD  06/01/2015, 5:10 PM

## 2015-06-05 NOTE — Telephone Encounter (Signed)
Called pt to inform her per Dr. Mike Gip no steroids need to be taken and she will discuss with her how she may use steroids for nausea if needed after  Chemo at her appointment on Thursday; [place] lab slip filled out and pt to pick up tomorrow

## 2015-06-07 ENCOUNTER — Inpatient Hospital Stay: Payer: 59

## 2015-06-07 ENCOUNTER — Inpatient Hospital Stay (HOSPITAL_BASED_OUTPATIENT_CLINIC_OR_DEPARTMENT_OTHER): Payer: 59 | Admitting: Hematology and Oncology

## 2015-06-07 ENCOUNTER — Other Ambulatory Visit: Payer: Self-pay

## 2015-06-07 VITALS — BP 132/82 | HR 101 | Temp 95.2°F | Ht 64.0 in | Wt 182.1 lb

## 2015-06-07 DIAGNOSIS — Z418 Encounter for other procedures for purposes other than remedying health state: Secondary | ICD-10-CM | POA: Diagnosis not present

## 2015-06-07 DIAGNOSIS — C799 Secondary malignant neoplasm of unspecified site: Secondary | ICD-10-CM | POA: Diagnosis not present

## 2015-06-07 DIAGNOSIS — C55 Malignant neoplasm of uterus, part unspecified: Secondary | ICD-10-CM

## 2015-06-07 DIAGNOSIS — Z79899 Other long term (current) drug therapy: Secondary | ICD-10-CM

## 2015-06-07 DIAGNOSIS — R600 Localized edema: Secondary | ICD-10-CM | POA: Diagnosis not present

## 2015-06-07 LAB — CBC WITH DIFFERENTIAL/PLATELET
Basophils Absolute: 0.1 10*3/uL (ref 0–0.1)
Basophils Relative: 2 %
Eosinophils Absolute: 0.2 10*3/uL (ref 0–0.7)
Eosinophils Relative: 4 %
HCT: 32.3 % — ABNORMAL LOW (ref 35.0–47.0)
Hemoglobin: 10.3 g/dL — ABNORMAL LOW (ref 12.0–16.0)
Lymphocytes Relative: 26 %
Lymphs Abs: 1 10*3/uL (ref 1.0–3.6)
MCH: 29.9 pg (ref 26.0–34.0)
MCHC: 31.9 g/dL — ABNORMAL LOW (ref 32.0–36.0)
MCV: 93.6 fL (ref 80.0–100.0)
Monocytes Absolute: 0.6 10*3/uL (ref 0.2–0.9)
Monocytes Relative: 16 %
Neutro Abs: 2 10*3/uL (ref 1.4–6.5)
Neutrophils Relative %: 52 %
Platelets: 301 10*3/uL (ref 150–440)
RBC: 3.45 MIL/uL — ABNORMAL LOW (ref 3.80–5.20)
RDW: 19.5 % — ABNORMAL HIGH (ref 11.5–14.5)
WBC: 3.8 10*3/uL (ref 3.6–11.0)

## 2015-06-07 MED ORDER — DEXAMETHASONE 4 MG PO TABS: 8.0000 mg | ORAL_TABLET | Freq: Every day | ORAL | Status: DC

## 2015-06-07 MED ORDER — HEPARIN SOD (PORK) LOCK FLUSH 100 UNIT/ML IV SOLN
500.0000 [IU] | Freq: Once | INTRAVENOUS | Status: AC | PRN
  Administered 2015-06-07: 500 [IU]
  Filled 2015-06-07: qty 5

## 2015-06-07 MED ORDER — PEGFILGRASTIM 6 MG/0.6ML ~~LOC~~ PSKT
6.0000 mg | PREFILLED_SYRINGE | Freq: Once | SUBCUTANEOUS | Status: AC
  Administered 2015-06-07: 6 mg via SUBCUTANEOUS
  Filled 2015-06-07: qty 0.6

## 2015-06-07 MED ORDER — DOXORUBICIN HCL CHEMO IV INJECTION 2 MG/ML
60.0000 mg/m2 | Freq: Once | INTRAVENOUS | Status: AC
  Administered 2015-06-07: 116 mg via INTRAVENOUS
  Filled 2015-06-07: qty 58

## 2015-06-07 MED ORDER — SODIUM CHLORIDE 0.9 % IV SOLN
INTRAVENOUS | Status: DC
  Administered 2015-06-07: 14:00:00 via INTRAVENOUS
  Filled 2015-06-07: qty 1000

## 2015-06-07 MED ORDER — PALONOSETRON HCL INJECTION 0.25 MG/5ML
0.2500 mg | Freq: Once | INTRAVENOUS | Status: AC
  Administered 2015-06-07: 0.25 mg via INTRAVENOUS
  Filled 2015-06-07: qty 5

## 2015-06-07 MED ORDER — OXYCODONE-ACETAMINOPHEN 5-325 MG PO TABS: 1.0000 | ORAL_TABLET | Freq: Four times a day (QID) | ORAL | Status: DC | PRN

## 2015-06-07 MED ORDER — SODIUM CHLORIDE 0.9 % IV SOLN
Freq: Once | INTRAVENOUS | Status: AC
  Administered 2015-06-07: 14:00:00 via INTRAVENOUS
  Filled 2015-06-07: qty 5

## 2015-06-07 NOTE — Progress Notes (Signed)
Pt here today for follow up regarding uterine cancer and new adriamycin treatment

## 2015-06-08 ENCOUNTER — Ambulatory Visit: Payer: 59

## 2015-06-08 ENCOUNTER — Ambulatory Visit
Admission: RE | Admit: 2015-06-08 | Discharge: 2015-06-08 | Disposition: A | Discharge status: Home / Self Care | Payer: 59 | Source: Ambulatory Visit | Attending: Hematology and Oncology | Admitting: Hematology and Oncology

## 2015-06-08 DIAGNOSIS — C55 Malignant neoplasm of uterus, part unspecified: Secondary | ICD-10-CM | POA: Diagnosis present

## 2015-06-08 DIAGNOSIS — R600 Localized edema: Secondary | ICD-10-CM

## 2015-06-08 DIAGNOSIS — R609 Edema, unspecified: Secondary | ICD-10-CM | POA: Insufficient documentation

## 2015-06-11 ENCOUNTER — Other Ambulatory Visit: Payer: Self-pay | Admitting: Hematology and Oncology

## 2015-06-11 ENCOUNTER — Telehealth: Payer: Self-pay

## 2015-06-11 DIAGNOSIS — J018 Other acute sinusitis: Secondary | ICD-10-CM

## 2015-06-11 MED ORDER — AMOXICILLIN-POT CLAVULANATE 875-125 MG PO TABS: 1.0000 | ORAL_TABLET | Freq: Two times a day (BID) | ORAL | Status: DC

## 2015-06-11 NOTE — Telephone Encounter (Signed)
  A prescription for Augmentin has been e-scribed.  M

## 2015-06-11 NOTE — Telephone Encounter (Signed)
Pt called and stated she thinks she has a sinus infection; no fever but has some yellow nasal drainage and coughing up mucous for 2 days; she wants to see if Dr. Mike Gip would call in an antibiotic

## 2015-06-11 NOTE — Telephone Encounter (Signed)
  Is she allergic to anything?  I would typically call in Augmentin (amoxicillin + clavulonic acid).  M

## 2015-06-11 NOTE — Telephone Encounter (Signed)
According to her medical record, she is allergic to Pravastatin, Sulfa antibiotics and Latex

## 2015-06-21 ENCOUNTER — Telehealth: Payer: Self-pay | Admitting: *Deleted

## 2015-06-21 NOTE — Telephone Encounter (Signed)
I returned pt's phone call and informed her that her form would be ready  to pick up on Monday... Dr. Mike Gip will need to sign on Friday; then form will need to be left downstairs in registration.Marland KitchenMarland Kitchen

## 2015-06-27 ENCOUNTER — Encounter: Payer: Self-pay | Admitting: Hematology and Oncology

## 2015-06-28 ENCOUNTER — Telehealth: Payer: Self-pay

## 2015-06-28 ENCOUNTER — Inpatient Hospital Stay: Payer: 59

## 2015-06-28 ENCOUNTER — Other Ambulatory Visit: Payer: Self-pay

## 2015-06-28 ENCOUNTER — Inpatient Hospital Stay (HOSPITAL_BASED_OUTPATIENT_CLINIC_OR_DEPARTMENT_OTHER): Payer: 59 | Admitting: Hematology and Oncology

## 2015-06-28 VITALS — BP 114/73 | HR 102 | Temp 96.2°F | Ht 64.0 in | Wt 175.7 lb

## 2015-06-28 VITALS — BP 123/85 | HR 87 | Temp 97.0°F | Resp 18

## 2015-06-28 DIAGNOSIS — C55 Malignant neoplasm of uterus, part unspecified: Secondary | ICD-10-CM

## 2015-06-28 DIAGNOSIS — Z418 Encounter for other procedures for purposes other than remedying health state: Secondary | ICD-10-CM | POA: Diagnosis not present

## 2015-06-28 DIAGNOSIS — C799 Secondary malignant neoplasm of unspecified site: Secondary | ICD-10-CM | POA: Diagnosis not present

## 2015-06-28 DIAGNOSIS — Z79899 Other long term (current) drug therapy: Secondary | ICD-10-CM

## 2015-06-28 DIAGNOSIS — R600 Localized edema: Secondary | ICD-10-CM | POA: Diagnosis not present

## 2015-06-28 MED ORDER — PALONOSETRON HCL INJECTION 0.25 MG/5ML
0.2500 mg | Freq: Once | INTRAVENOUS | Status: AC
Start: 2015-06-28 — End: 2015-06-28
  Administered 2015-06-28: 0.25 mg via INTRAVENOUS
  Filled 2015-06-28: qty 5

## 2015-06-28 MED ORDER — PEGFILGRASTIM 6 MG/0.6ML ~~LOC~~ PSKT
6.0000 mg | PREFILLED_SYRINGE | Freq: Once | SUBCUTANEOUS | Status: AC
  Administered 2015-06-28: 6 mg via SUBCUTANEOUS
  Filled 2015-06-28: qty 0.6

## 2015-06-28 MED ORDER — SODIUM CHLORIDE 0.9 % IV SOLN
INTRAVENOUS | Status: DC
Start: 2015-06-28 — End: 2015-12-13
  Administered 2015-06-28: 13:00:00 via INTRAVENOUS
  Filled 2015-06-28: qty 1000

## 2015-06-28 MED ORDER — DEXAMETHASONE 4 MG PO TABS: 8.0000 mg | ORAL_TABLET | Freq: Every day | ORAL | Status: DC

## 2015-06-28 MED ORDER — HEPARIN SOD (PORK) LOCK FLUSH 100 UNIT/ML IV SOLN
500.0000 [IU] | Freq: Once | INTRAVENOUS | Status: AC | PRN
  Filled 2015-06-28: qty 5

## 2015-06-28 MED ORDER — DOXORUBICIN HCL CHEMO IV INJECTION 2 MG/ML
60.0000 mg/m2 | Freq: Once | INTRAVENOUS | Status: AC
  Administered 2015-06-28: 116 mg via INTRAVENOUS
  Filled 2015-06-28: qty 58

## 2015-06-28 MED ORDER — SODIUM CHLORIDE 0.9 % IJ SOLN
10.0000 mL | INTRAMUSCULAR | Status: DC | PRN
  Administered 2015-06-28: 10 mL
  Filled 2015-06-28: qty 10

## 2015-06-28 MED ORDER — SODIUM CHLORIDE 0.9 % IV SOLN
Freq: Once | INTRAVENOUS | Status: AC
  Administered 2015-06-28: 12:00:00 via INTRAVENOUS
  Filled 2015-06-28: qty 5

## 2015-06-28 NOTE — Telephone Encounter (Signed)
Patient called to request a refill on her Dexamethasone. I spoke with Dr. Mike Gip and she said the patient should still have tablets left according to the directions on the prescription. I called the patient back to explain that according to the prescription she was to take the Dexamethasone 2 times a day starting on day after chemotherapy for 3 days. Patient stated she continued to take Dexamethasone until she completed the prescription. Dr. Mike Gip aware, stated ok to refill but to make sure patient knows to stop medication after 3 days.  I returned the call to the patient and told her we will refill her Dexamethasone, but to take as directed 2 tabs a day for 3 days following chemotherapy. Patient stated understanding.

## 2015-06-28 NOTE — Progress Notes (Signed)
Pt here today for follow up and treatment #2 Adriamycin; offers no complaints today

## 2015-06-29 ENCOUNTER — Ambulatory Visit: Payer: 59

## 2015-07-12 ENCOUNTER — Other Ambulatory Visit: Payer: Self-pay

## 2015-07-18 ENCOUNTER — Encounter: Payer: Self-pay | Admitting: Hematology and Oncology

## 2015-07-19 ENCOUNTER — Ambulatory Visit
Admission: RE | Admit: 2015-07-19 | Discharge: 2015-07-19 | Disposition: A | Discharge status: Home / Self Care | Payer: 59 | Source: Ambulatory Visit | Attending: Hematology and Oncology | Admitting: Hematology and Oncology

## 2015-07-19 ENCOUNTER — Inpatient Hospital Stay: Payer: 59

## 2015-07-19 ENCOUNTER — Inpatient Hospital Stay: Payer: 59 | Attending: Hematology and Oncology | Admitting: Hematology and Oncology

## 2015-07-19 VITALS — BP 109/77 | HR 96 | Temp 98.3°F | Ht 64.0 in | Wt 170.4 lb

## 2015-07-19 DIAGNOSIS — C55 Malignant neoplasm of uterus, part unspecified: Secondary | ICD-10-CM | POA: Diagnosis not present

## 2015-07-19 DIAGNOSIS — Z418 Encounter for other procedures for purposes other than remedying health state: Secondary | ICD-10-CM | POA: Diagnosis not present

## 2015-07-19 DIAGNOSIS — M7989 Other specified soft tissue disorders: Secondary | ICD-10-CM

## 2015-07-19 DIAGNOSIS — I82402 Acute embolism and thrombosis of unspecified deep veins of left lower extremity: Secondary | ICD-10-CM | POA: Insufficient documentation

## 2015-07-19 DIAGNOSIS — Z79899 Other long term (current) drug therapy: Secondary | ICD-10-CM | POA: Insufficient documentation

## 2015-07-19 DIAGNOSIS — I82432 Acute embolism and thrombosis of left popliteal vein: Secondary | ICD-10-CM | POA: Insufficient documentation

## 2015-07-19 DIAGNOSIS — Z5111 Encounter for antineoplastic chemotherapy: Secondary | ICD-10-CM | POA: Insufficient documentation

## 2015-07-19 LAB — CBC WITH DIFFERENTIAL/PLATELET
Basophils Absolute: 0.1 10*3/uL (ref 0–0.1)
Basophils Relative: 2 %
Eosinophils Absolute: 0 10*3/uL (ref 0–0.7)
Eosinophils Relative: 0 %
HCT: 36.7 % (ref 35.0–47.0)
Hemoglobin: 11.7 g/dL — ABNORMAL LOW (ref 12.0–16.0)
Lymphocytes Relative: 31 %
Lymphs Abs: 0.9 10*3/uL — ABNORMAL LOW (ref 1.0–3.6)
MCH: 30 pg (ref 26.0–34.0)
MCHC: 31.8 g/dL — ABNORMAL LOW (ref 32.0–36.0)
MCV: 94.4 fL (ref 80.0–100.0)
Monocytes Absolute: 0.6 10*3/uL (ref 0.2–0.9)
Monocytes Relative: 21 %
Neutro Abs: 1.4 10*3/uL (ref 1.4–6.5)
Neutrophils Relative %: 46 %
Platelets: 387 10*3/uL (ref 150–440)
RBC: 3.88 MIL/uL (ref 3.80–5.20)
RDW: 16 % — ABNORMAL HIGH (ref 11.5–14.5)
WBC: 2.9 10*3/uL — ABNORMAL LOW (ref 3.6–11.0)

## 2015-07-19 LAB — PROTIME-INR
INR: 1.1
Prothrombin Time: 14.4 seconds (ref 11.4–15.0)

## 2015-07-19 LAB — APTT: aPTT: 37 seconds — ABNORMAL HIGH (ref 24–36)

## 2015-07-19 MED ORDER — PALONOSETRON HCL INJECTION 0.25 MG/5ML
0.2500 mg | Freq: Once | INTRAVENOUS | Status: AC
  Administered 2015-07-19: 0.25 mg via INTRAVENOUS
  Filled 2015-07-19: qty 5

## 2015-07-19 MED ORDER — DOXORUBICIN HCL CHEMO IV INJECTION 2 MG/ML
60.0000 mg/m2 | Freq: Once | INTRAVENOUS | Status: AC
  Administered 2015-07-19: 116 mg via INTRAVENOUS
  Filled 2015-07-19: qty 58

## 2015-07-19 MED ORDER — PEGFILGRASTIM 6 MG/0.6ML ~~LOC~~ PSKT
6.0000 mg | PREFILLED_SYRINGE | Freq: Once | SUBCUTANEOUS | Status: AC
  Administered 2015-07-19: 6 mg via SUBCUTANEOUS

## 2015-07-19 MED ORDER — SODIUM CHLORIDE 0.9 % IV SOLN
Freq: Once | INTRAVENOUS | Status: AC
  Administered 2015-07-19: 14:00:00 via INTRAVENOUS
  Filled 2015-07-19: qty 5

## 2015-07-19 MED ORDER — SODIUM CHLORIDE 0.9 % IV SOLN
INTRAVENOUS | Status: DC
  Administered 2015-07-19: 14:00:00 via INTRAVENOUS
  Filled 2015-07-19: qty 1000

## 2015-07-19 MED ORDER — HEPARIN SOD (PORK) LOCK FLUSH 100 UNIT/ML IV SOLN
500.0000 [IU] | Freq: Once | INTRAVENOUS | Status: AC | PRN
  Administered 2015-07-19: 500 [IU]
  Filled 2015-07-19: qty 5

## 2015-07-19 MED ORDER — RIVAROXABAN (XARELTO) VTE STARTER PACK (15 & 20 MG): ORAL_TABLET | ORAL | Status: DC

## 2015-07-19 NOTE — Progress Notes (Signed)
[facility] day:  07/19/2015  Chief Complaint: Cassidy Bradley is an 54 y.o. female with metastatic uterine leiomyosarcoma who is seen for assessment prior to cycle #3 adriamycin.  HPI: The patient was last seen in the medical oncology clinic on 06/28/2015.  At that time, she received cycle #2 adriamycin with Neulasta support (OnPro).  She tolerated her chemotherapy well.    Labs on 07/18/2015 from [facility] included a hematocrit of 35.8, hemoglobin 11.2, platelets 399,000, white count 2500 with an ANC of 1100. Comprehensive metabolic panel included a creatinine of 0.64. Liver function tests were normal. Magnesium was 2.1.  Symptomatically, she is doing well.  She notes that the back of her left leg has been bothering her for the past week.  She just realized that her left leg is swollen.  She has rare chest discomfort.   Past Medical History  Diagnosis Date  . Breast lump 2006     both breast   . Chest wall mass   . Cancer   . Uterine cancer     Past Surgical History  Procedure Laterality Date  . Cholecystectomy  2001  . Ankle surgery  2005  . Cesarean section  1993  . Back surgery  2005  . Breast biopsy Bilateral   . Colonoscopy    . Abdominal hysterectomy  01/30/15   Family History  Problem Relation Age of Onset  . Breast cancer Sister 51  . Breast cancer Maternal Aunt   . Breast cancer Maternal Grandmother 60  . Stomach cancer Maternal Aunt   . Lung cancer Maternal Grandfather   . Colon polyps Mother   . Cancer - Colon Cousin 8    first cousin  . Lupus Father   . Esophageal cancer Maternal Uncle     Social History:  reports that she has never smoked. She has never used smokeless tobacco. She reports that she does not drink alcohol or use illicit drugs.  The patient is accompanied by her twin sister.  Allergies:  Allergies  Allergen Reactions  . Pravastatin Hives and Nausea And Vomiting  . Sulfa Antibiotics Hives   Other reaction(s): Localized superficial swelling of skin  . Latex Rash  . Other Rash    Spandex   Current Medications: Current Outpatient Prescriptions  Medication Sig Dispense Refill  . Alum & Mag Hydroxide-Simeth (MAGIC MOUTHWASH) SOLN Take 5 mLs by mouth 3 (three) times daily as needed for mouth pain (swish and spit).   0  . amoxicillin-clavulanate (AUGMENTIN) 875-125 MG per tablet Take 1 tablet by mouth 2 (two) times daily. 10 tablet 0  . amoxicillin-clavulanate (AUGMENTIN) 875-125 MG per tablet Take 1 tablet by mouth 2 (two) times daily. 14 tablet 0  . cholecalciferol (VITAMIN D) 1000 UNITS tablet Take 1,000 Units by mouth daily.    Marland Kitchen dexamethasone (DECADRON) 4 MG tablet Take 2 tablets (8 mg total) by mouth daily. beginning the day after chemotherapy for 3 days 20 tablet 0  . lidocaine-prilocaine (EMLA) cream Apply 1 application topically as needed. Apply one hour prior to chemotherapy 30 g 1  . loratadine (CLARITIN) 10 MG tablet Take 10 mg by mouth daily.    Marland Kitchen nystatin (MYCOSTATIN) 100000 UNIT/ML suspension Take 5 mLs (500,000 Units total) by mouth 4 (four) times daily. 100 mL 0  . ondansetron (ZOFRAN) 8 MG tablet Take by mouth every 8 (eight) hours as needed for nausea or vomiting.    Marland Kitchen oxyCODONE-acetaminophen (PERCOCET/ROXICET) 5-325 MG per  tablet Take 1 tablet by mouth every 6 (six) hours as needed for severe pain. 30 tablet 0  . CORTEF 10 MG tablet     . Rivaroxaban (XARELTO STARTER PACK) 15 & 20 MG TBPK Take as directed on package: Start with one $Remove'15mg'DCzQitc$  tablet by mouth twice a day with food. On Day 22, switch to one $Remo'20mg'ZmmWp$  tablet once a day with food. 51 each 0   No current facility-administered medications for this visit.   Facility-Administered Medications Ordered in Other Visits  Medication Dose Route Frequency Provider Last Rate Last Dose  . 0.9 %  sodium chloride infusion   Intravenous Continuous Lequita Asal, MD 125 mL/hr at 06/28/15 1309    . sodium chloride 0.9 %  injection 10 mL  10 mL Intracatheter PRN Lequita Asal, MD      . sodium chloride 0.9 % injection 10 mL  10 mL Intracatheter PRN Lequita Asal, MD   10 mL at 06/28/15 1045   Review of Systems:  GENERAL:  Feels good.  No fevers or sweats.  Weight down 5 pounds. PERFORMANCE STATUS (ECOG):  1-2 HEENT:  No visual changes, runny nose, sore throat, mouth sores or tenderness. Lungs: No shortness of breath or cough.  No hemoptysis. Cardiac:  Rare chest discomfort.  No chest pain, palpitations, orthopnea, or PND. GI:  No nausea, vomiting, diarrhea, constipation, melena or hematochezia. GU:  No urgency, frequency, dysuria, or hematuria. Musculoskeletal:  No back pain.  No joint pain.  No muscle tenderness. Extremities:  Back of left leg has been bothering her for 1 week.  New left lower extremity swelling. Skin:  No rashes or skin changes. Neuro:  No headache, numbness or weakness, balance or coordination issues. Endocrine:  No diabetes, thyroid issues, hot flashes or night sweats. Psych:  No mood changes, depression or anxiety. Pain:  No focal pain. Review of systems:  All other systems reviewed and found to be negative.                        Physical Exam: Blood pressure 109/77, pulse 96, temperature 98.3 F (36.8 C), temperature source Tympanic, height $RemoveBeforeDE'5\' 4"'[id number]$  (1.626 m), weight 170 lb 6.7 oz (77.3 kg).  GENERAL:  Well developed, well nourished, sitting comfortably in the exam room in no acute distress. MENTAL STATUS:  Alert and oriented to person, place and time. HEAD:  Alopecia totalis.  Normocephalic, atraumatic, face symmetric, no Cushingoid features. EYES:  Brown eyes.  Pupils equal round and reactive to light and accomodation.  No conjunctivitis or scleral icterus. ENT:  Oropharynx clear without lesion.  Tongue normal. Mucous membranes moist.  RESPIRATORY:  Clear to auscultation without rales, wheezes or rhonchi. CARDIOVASCULAR:  Regular rate and rhythm without murmur, rub or  gallop. ABDOMEN:  Soft, non-tender, with active bowel sounds, and no hepatosplenomegaly.  No masses. SKIN:  No rashes, ulcers or lesions. EXTREMITIES:  Left lower extremity edema with palpable cord.  No skin discoloration.  Slightly tender.  No erythema. LYMPH NODES: No palpable cervical, supraclavicular, axillary or inguinal adenopathy  NEUROLOGICAL: Unremarkable. PSYCH:  Appropriate.  Appointment on 07/19/2015  Component Date Value Ref Range Status  . WBC 07/19/2015 2.9* 3.6 - 11.0 K/uL Final  . RBC 07/19/2015 3.88  3.80 - 5.20 MIL/uL Final  . Hemoglobin 07/19/2015 11.7* 12.0 - 16.0 g/dL Final  . HCT 07/19/2015 36.7  35.0 - 47.0 % Final  . MCV 07/19/2015 94.4  80.0 - 100.0 fL Final  .  MCH 07/19/2015 30.0  26.0 - 34.0 pg Final  . MCHC 07/19/2015 31.8* 32.0 - 36.0 g/dL Final  . RDW 07/19/2015 16.0* 11.5 - 14.5 % Final  . Platelets 07/19/2015 387  150 - 440 K/uL Final  . Neutrophils Relative % 07/19/2015 46   Final  . Neutro Abs 07/19/2015 1.4  1.4 - 6.5 K/uL Final  . Lymphocytes Relative 07/19/2015 31   Final  . Lymphs Abs 07/19/2015 0.9* 1.0 - 3.6 K/uL Final  . Monocytes Relative 07/19/2015 21   Final  . Monocytes Absolute 07/19/2015 0.6  0.2 - 0.9 K/uL Final  . Eosinophils Relative 07/19/2015 0   Final  . Eosinophils Absolute 07/19/2015 0.0  0 - 0.7 K/uL Final  . Basophils Relative 07/19/2015 2   Final  . Basophils Absolute 07/19/2015 0.1  0 - 0.1 K/uL Final  . aPTT 07/19/2015 37* 24 - 36 seconds Final   Comment:        IF BASELINE aPTT IS ELEVATED, SUGGEST PATIENT RISK ASSESSMENT BE USED TO DETERMINE APPROPRIATE ANTICOAGULANT THERAPY.   . Prothrombin Time 07/19/2015 14.4  11.4 - 15.0 seconds Final  . INR 07/19/2015 1.10   Final   Assessment:  Cassidy Bradley is a 54 y.o. female 54 y.o. female with stage IV uterine leiomyosarcoma. She underwent TAH/BSO on 01/30/2015. Pathology confirmed high grade leiomyosarcoma with 2 large adjacent tumor nodules (10 cm and 5 cm) and a  separate 0.5 cm serosal nodule.   Chest, abdomen, and pelvic CT scan on 02/12/2015 revealed a 1.7 x 2.3 cm right external iliac node. Pulmonary nodules were scattered throughout the lungs bilaterally. There was left internal mammary chain adenopathy measuring up to 3.6 x 2 cm.  She underwent anterior chest wall pleural-based CT guided biopsy on 02/21/2015. Pathology was consistent with metastatic leiomyosarcoma. Bone scan on 02/28/2015 revealed no evidence of metastatic disease with indeterminate uptake in the left posterior rib.   She has a fraternal twin who developed breast cancer at age 65. Several other family members have malignancies. 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  Prechemotherapy labs from [facility] revealed leukopenia (WBC 2300 with ANC 900). Work-up to date is negative. She has a family history of leukopenia. Bone marrow on 03/08/2015 revealed no evidence of neoplasia or metastatic disease. Marrow was normocellular for age (30-50%) with trilineage hematopoiesis. There was no increase in marrow reticulin fibers. Flow cytometry and cytogenetics were normal.  She received 4 cycles of gemcitabine and Taxotere (03/20/2015 - 05/22/2015) with Neulasta support. She received gemcitabine alone on 05/22/2015.  She was admitted on 05/24/2015 with an acute febrile illness. All cultures were negative.   Chest CT on 05/25/2015 revealed early progressive disease. Abdominal and pelvic CT scan on 05/30/2015 revealed stable to slightly increased right external iliac lymph node (1.8 x 2.5 cm). Echocardiogram on 06/18/2015 revealed ejection fraction of 60-65%.  Bilateral lower extremity duplex on 05/30/2015 was negative.  Left upper extremity duplex on 06/08/2015 was negative.  She is status post 2 cycles of adriamycin (06/07/2015 - 06/28/2015) with Neulasta (On-Pro) support.  She has tolerated her chemotherapy  well.    Symptomatically, she is feels good.  She has had discomfort in her left leg for 1 week.  She has left lower extremity swelling and a palpable cord.  Plan: 1.  Review labs from [facility] to ensure counts improving. 2.  Cycle #3 adriamycin today. 3.  Neulasta support (On-Pro). 4.  Left lower  extremity duplex. 5.  Return to clinic after duplex.  If positive, check baseline PT/PTT. 6.  Slip for [facility] labs in 10 days and prior to next cycle. 7.  Schedule chest, abdomen, and pelvic CT scan- restaging. 8.  RTC in 3 weeks for MD assess, review of [facility] labs, review of CT scans, and assessment prior to cycle #4 adriamycin.  Addendum:  Left lower extremity duplex revealed an acute DVT in the popliteal vein.  The findings were discussed with the patient.  We discussed initiation of Xarelto.  A starter pack was prescribed (15 mg BID x 3 weeks then 20 mg a day).   Lequita Asal, MD

## 2015-07-19 NOTE — Progress Notes (Signed)
Pt here today for follow up and treatment; offers no complaints

## 2015-07-20 ENCOUNTER — Other Ambulatory Visit: Payer: Self-pay

## 2015-07-20 DIAGNOSIS — R918 Other nonspecific abnormal finding of lung field: Secondary | ICD-10-CM

## 2015-07-22 ENCOUNTER — Encounter: Payer: Self-pay | Admitting: Hematology and Oncology

## 2015-07-22 NOTE — Progress Notes (Addendum)
[facility] day:  06/28/2015  Chief Complaint: Cassidy Bradley is an 54 y.o. female with metastatic uterine leiomyosarcoma who is seen for assessment prior to cycle #2 adriamycin.  HPI: The patient was last seen in the medical oncology clinic on 06/07/2015.  At that time, she received cycle #1 adriamycin.  She received Neulasta the following day.  She tolerated her chemotherapy well.  She took her Decadron prescription until it ran out, rather than the 3 days prescribed.    At last visit, she had some left upper extremity swelling.  Left upper extremity duplex on 06/08/2015 revealed no evidence of thrombosis.  She received a prescription for Augmentin for 7 days on 06/11/2015 for sinusitis.  She is feeling better.  [facility] labs from 06/27/2015 revealed a hematocrit of 34.9, hemoglobin 11, MCV 96, platelets 275,000, white count 3800 with an ANC of 2400.  Comprehensive metabolic panel included a creatinine of 0.59. Liver function tests were normal.  Symptomatically, she has a little bit of discomfort in her chest.  She feels better with her bra off.  She has not used her Percocet.  Past Medical History  Diagnosis Date  . Breast lump 2006     both breast   . Chest wall mass   . Cancer   . Uterine cancer     Past Surgical History  Procedure Laterality Date  . Cholecystectomy  2001  . Ankle surgery  2005  . Cesarean section  1993  . Back surgery  2005  . Breast biopsy Bilateral   . Colonoscopy    . Abdominal hysterectomy  01/30/15   Family History  Problem Relation Age of Onset  . Breast cancer Sister 30  . Breast cancer Maternal Aunt   . Breast cancer Maternal Grandmother 60  . Stomach cancer Maternal Aunt   . Lung cancer Maternal Grandfather   . Colon polyps Mother   . Cancer - Colon Cousin 29    first cousin  . Lupus Father   . Esophageal cancer Maternal Uncle     Social History:  reports that she has never smoked. She has  never used smokeless tobacco. She reports that she does not drink alcohol or use illicit drugs.  She is alone today.  Allergies:  Allergies  Allergen Reactions  . Pravastatin Hives and Nausea And Vomiting  . Sulfa Antibiotics Hives    Other reaction(s): Localized superficial swelling of skin  . Latex Rash  . Other Rash    Spandex   Current Medications: Current Outpatient Prescriptions  Medication Sig Dispense Refill  . Alum & Mag Hydroxide-Simeth (MAGIC MOUTHWASH) SOLN Take 5 mLs by mouth 3 (three) times daily as needed for mouth pain (swish and spit).   0  . dexamethasone (DECADRON) 4 MG tablet Take 2 tablets (8 mg total) by mouth daily. beginning the day after chemotherapy for 3 days 20 tablet 0  . lidocaine-prilocaine (EMLA) cream Apply 1 application topically as needed. Apply one hour prior to chemotherapy 30 g 1  . loratadine (CLARITIN) 10 MG tablet Take 10 mg by mouth daily.    Marland Kitchen oxyCODONE-acetaminophen (PERCOCET/ROXICET) 5-325 MG per tablet Take 1 tablet by mouth every 6 (six) hours as needed for severe pain. 30 tablet 0  . amoxicillin-clavulanate (AUGMENTIN) 875-125 MG per tablet Take 1 tablet by mouth 2 (two) times daily. 10 tablet 0  . amoxicillin-clavulanate (AUGMENTIN) 875-125 MG per tablet Take 1 tablet by mouth 2 (two) times daily. [place]  tablet 0  . cholecalciferol (VITAMIN D) 1000 UNITS tablet Take 1,000 Units by mouth daily.    . CORTEF 10 MG tablet     . nystatin (MYCOSTATIN) 100000 UNIT/ML suspension Take 5 mLs (500,000 Units total) by mouth 4 (four) times daily. 100 mL 0  . ondansetron (ZOFRAN) 8 MG tablet Take by mouth every 8 (eight) hours as needed for nausea or vomiting.    . Rivaroxaban (XARELTO STARTER PACK) 15 & 20 MG TBPK Take as directed on package: Start with one $Remove'15mg'PuuaHKv$  tablet by mouth twice a day with food. On Day 22, switch to one $Remo'20mg'VCJLH$  tablet once a day with food. 51 each 0   No current facility-administered medications for this visit.   Facility-Administered  Medications Ordered in Other Visits  Medication Dose Route Frequency Provider Last Rate Last Dose  . 0.9 %  sodium chloride infusion   Intravenous Continuous Lequita Asal, MD 125 mL/hr at 06/28/15 1309    . sodium chloride 0.9 % injection 10 mL  10 mL Intracatheter PRN Lequita Asal, MD      . sodium chloride 0.9 % injection 10 mL  10 mL Intracatheter PRN Lequita Asal, MD   10 mL at 06/28/15 1045   Review of Systems:  GENERAL:  Feeling better.  No fevers, sweats or weight loss. PERFORMANCE STATUS (ECOG):  1-2 HEENT:  Interval sinus infection.  No visual changes, runny nose, sore throat, mouth sores or tenderness. Lungs: Slight chest discomfort.  Feels better with bra off.  No shortness of breath or cough.  No hemoptysis. Cardiac:  No chest pain, palpitations, orthopnea, or PND. GI:  Belches when eats.  No nausea, vomiting, diarrhea, constipation, melena or hematochezia. GU:  No urgency, frequency, dysuria, or hematuria. Musculoskeletal:  No back pain.  No joint pain.  No muscle tenderness. Extremities:  No pain or swelling. Skin:  No rashes or skin changes. Neuro:  No headache, numbness or weakness, balance or coordination issues. Endocrine:  No diabetes, thyroid issues, hot flashes or night sweats. Psych:  No mood changes, depression or anxiety. Pain:  No focal pain. Review of systems:  All other systems reviewed and found to be negative.                  Physical Exam: Blood pressure 114/73, pulse 102, temperature 96.2 F (35.7 C), temperature source Tympanic, height $RemoveBeforeDE'5\' 4"'[id number]$  (1.626 m), weight 175 lb 11.3 oz (79.7 kg).  GENERAL:  Well developed, well nourished, sitting comfortably in the exam room in no acute distress. MENTAL STATUS:  Alert and oriented to person, place and time. HEAD:  Wearing a black wrap.  Alopecia totalis.  Normocephalic, atraumatic, face symmetric, no Cushingoid features. EYES:  Glasses.  Brown eyes.  Pupils equal round and reactive to light and  accomodation.  No conjunctivitis or scleral icterus. ENT:  Oropharynx clear without lesion.  Tongue normal. Mucous membranes moist.  RESPIRATORY:  Clear to auscultation without rales, wheezes or rhonchi. CARDIOVASCULAR:  Regular rate and rhythm without murmur, rub or gallop. CHEST:  No pain on palpation along ribs. ABDOMEN:  Soft, non-tender, with active bowel sounds, and no hepatosplenomegaly.  No masses. SKIN:  No rashes, ulcers or lesions. EXTREMITIES: Trace left upper extremity edema without hand swelling.  No skin discoloration or tenderness.  No palpable cords. LYMPH NODES: No palpable cervical, supraclavicular, axillary or inguinal adenopathy  NEUROLOGICAL: Unremarkable. PSYCH:  Appropriate.   No visits with results within 3 Day(s) from this visit. Latest  known visit with results is:  Infusion on 06/07/2015  Component Date Value Ref Range Status  . WBC 06/07/2015 3.8  3.6 - 11.0 K/uL Final   A-LINE DRAW  . RBC 06/07/2015 3.45* 3.80 - 5.20 MIL/uL Final  . Hemoglobin 06/07/2015 10.3* 12.0 - 16.0 g/dL Final  . HCT 06/07/2015 32.3* 35.0 - 47.0 % Final  . MCV 06/07/2015 93.6  80.0 - 100.0 fL Final  . MCH 06/07/2015 29.9  26.0 - 34.0 pg Final  . MCHC 06/07/2015 31.9* 32.0 - 36.0 g/dL Final  . RDW 06/07/2015 19.5* 11.5 - 14.5 % Final  . Platelets 06/07/2015 301  150 - 440 K/uL Final  . Neutrophils Relative % 06/07/2015 52   Final  . Neutro Abs 06/07/2015 2.0  1.4 - 6.5 K/uL Final  . Lymphocytes Relative 06/07/2015 26   Final  . Lymphs Abs 06/07/2015 1.0  1.0 - 3.6 K/uL Final  . Monocytes Relative 06/07/2015 16   Final  . Monocytes Absolute 06/07/2015 0.6  0.2 - 0.9 K/uL Final  . Eosinophils Relative 06/07/2015 4   Final  . Eosinophils Absolute 06/07/2015 0.2  0 - 0.7 K/uL Final  . Basophils Relative 06/07/2015 2   Final  . Basophils Absolute 06/07/2015 0.1  0 - 0.1 K/uL Final   Assessment:  Cassidy Bradley is a 54 y.o. female 54 y.o. female with stage IV uterine  leiomyosarcoma. She underwent TAH/BSO on 01/30/2015. Pathology confirmed high grade leiomyosarcoma with 2 large adjacent tumor nodules (10 cm and 5 cm) and a separate 0.5 cm serosal nodule.   Chest, abdomen, and pelvic CT scan on 02/12/2015 revealed a 1.7 x 2.3 cm right external iliac node. Pulmonary nodules were scattered throughout the lungs bilaterally. There was left internal mammary chain adenopathy measuring up to 3.6 x 2 cm.  She underwent anterior chest wall pleural-based CT guided biopsy on 02/21/2015. Pathology was consistent with metastatic leiomyosarcoma. Bone scan on 02/28/2015 revealed no evidence of metastatic disease with indeterminate uptake in the left posterior rib.   She has a fraternal twin who developed breast cancer at age 40. Several other family members have malignancies. 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  Prechemotherapy labs from [facility] revealed leukopenia (WBC 2300 with ANC 900). Work-up to date is negative. She has a family history of leukopenia. Bone marrow on 03/08/2015 revealed no evidence of neoplasia or metastatic disease. Marrow was normocellular for age (30-50%) with trilineage hematopoiesis. There was no increase in marrow reticulin fibers. Flow cytometry and cytogenetics were normal.  She received 4 cycles of gemcitabine and Taxotere (03/20/2015 - 05/22/2015) with Neulasta support. She received gemcitabine alone on 05/22/2015.  She was admitted on 05/24/2015 with an acute febrile illness. All cultures were negative.   Chest CT on 05/25/2015 revealed early progressive disease. Abdominal and pelvic CT scan on 05/30/2015 revealed stable to slightly increased right external iliac lymph node (1.8 x 2.5 cm). Echocardiogram on 06/18/2015 revealed ejection fraction of 60-65%.  Bilateral lower extremity duplex on 05/30/2015 was negative.  Left upper extremity duplex on  06/08/2015 was negative.  She is status post 1 cycle of adriamycin (06/07/2015) with Neulasta support.  She tolerated her chemotherapy well.  She had an interval sinus infection.  Symptomatically, she is feeling better.  She has some discomfort in her chest, but has not used her Percocet.  Left upper extremity edema has improved.  Plan: 1.  Review labs from [facility]. 2.  Cycle #2 adriamycin  today. 3.  Rx:  Decadron x 3 days for nausea.  Discuss taking only for 3 days. 4.  Complete disability paperwork. 5.  RTC tomorrow for Neulasta. 6.  Discuss plans for re-imaging after 3 cycles. 7.  RTC in 3 weeks for MD assess, labs ([facility]), and cycle #3 adriamycin.   Lequita Asal, MD

## 2015-07-22 NOTE — Progress Notes (Addendum)
[facility] day:  06/07/2015  Chief Complaint: Cassidy Bradley is an 54 y.o. female with metastatic uterine leiomyosarcoma who is seen for assessment prior to cycle #1 adriamycin.  HPI: The patient was last seen in the medical oncology clinic on 06/01/2015.  At that time, imaging studies revealed progressive disease.  We discussed second line therapy with adriamycin with Neulasta support.  During the interim, she has done well.  She notes belching when she eats.  She has some left upper extremity edema.  She continues to have mild bilateral lower extremity edema.  Duplex study on 05/30/2015 was negative.  Past Medical History  Diagnosis Date  . Breast lump 2006     both breast   . Chest wall mass   . Cancer   . Uterine cancer     Past Surgical History  Procedure Laterality Date  . Cholecystectomy  2001  . Ankle surgery  2005  . Cesarean section  1993  . Back surgery  2005  . Breast biopsy Bilateral   . Colonoscopy    . Abdominal hysterectomy  01/30/15   Family History  Problem Relation Age of Onset  . Breast cancer Sister 24  . Breast cancer Maternal Aunt   . Breast cancer Maternal Grandmother 60  . Stomach cancer Maternal Aunt   . Lung cancer Maternal Grandfather   . Colon polyps Mother   . Cancer - Colon Cousin 86    first cousin  . Lupus Father   . Esophageal cancer Maternal Uncle     Social History:  reports that she has never smoked. She has never used smokeless tobacco. She reports that she does not drink alcohol or use illicit drugs.   Allergies:  Allergies  Allergen Reactions  . Pravastatin Hives and Nausea And Vomiting  . Sulfa Antibiotics Hives    Other reaction(s): Localized superficial swelling of skin  . Latex Rash  . Other Rash    Spandex   Current Medications: Current Outpatient Prescriptions  Medication Sig Dispense Refill  . Alum & Mag Hydroxide-Simeth (MAGIC MOUTHWASH) SOLN Take 5 mLs by mouth 3  (three) times daily as needed for mouth pain (swish and spit).   0  . amoxicillin-clavulanate (AUGMENTIN) 875-125 MG per tablet Take 1 tablet by mouth 2 (two) times daily. 10 tablet 0  . lidocaine-prilocaine (EMLA) cream Apply 1 application topically as needed. Apply one hour prior to chemotherapy 30 g 1  . loratadine (CLARITIN) 10 MG tablet Take 10 mg by mouth daily.    Marland Kitchen nystatin (MYCOSTATIN) 100000 UNIT/ML suspension Take 5 mLs (500,000 Units total) by mouth 4 (four) times daily. 100 mL 0  . ondansetron (ZOFRAN) 8 MG tablet Take by mouth every 8 (eight) hours as needed for nausea or vomiting.    Marland Kitchen oxyCODONE-acetaminophen (PERCOCET/ROXICET) 5-325 MG per tablet Take 1 tablet by mouth every 6 (six) hours as needed for severe pain. 30 tablet 0  . amoxicillin-clavulanate (AUGMENTIN) 875-125 MG per tablet Take 1 tablet by mouth 2 (two) times daily. 14 tablet 0  . cholecalciferol (VITAMIN D) 1000 UNITS tablet Take 1,000 Units by mouth daily.    . CORTEF 10 MG tablet     . dexamethasone (DECADRON) 4 MG tablet Take 2 tablets (8 mg total) by mouth daily. beginning the day after chemotherapy for 3 days 20 tablet 0  . Rivaroxaban (XARELTO STARTER PACK) 15 & 20 MG TBPK Take as directed on package: Start with one $Remove'15mg'MsrqOPQ$   tablet by mouth twice a day with food. On Day 22, switch to one $Remo'20mg'XFHqM$  tablet once a day with food. 51 each 0   No current facility-administered medications for this visit.   Facility-Administered Medications Ordered in Other Visits  Medication Dose Route Frequency Provider Last Rate Last Dose  . 0.9 %  sodium chloride infusion   Intravenous Continuous Lequita Asal, MD 125 mL/hr at 06/28/15 1309    . sodium chloride 0.9 % injection 10 mL  10 mL Intracatheter PRN Lequita Asal, MD      . sodium chloride 0.9 % injection 10 mL  10 mL Intracatheter PRN Lequita Asal, MD   10 mL at 06/28/15 1045   Review of Systems:  GENERAL:  Feels fine.  No fevers, sweats or weight  loss. PERFORMANCE STATUS (ECOG):  1 HEENT:  No visual changes, runny nose, sore throat, mouth sores or tenderness. Lungs: No shortness of breath or cough.  No hemoptysis. Cardiac:  No chest pain, palpitations, orthopnea, or PND. GI:  Belches when eats.  No nausea, vomiting, diarrhea, constipation, melena or hematochezia. GU:  No urgency, frequency, dysuria, or hematuria. Musculoskeletal:  No back pain.  No joint pain.  No muscle tenderness. Extremities:  Left upper extremity edema (new).  Swelling in legs (no change). Skin:  No rashes or skin changes. Neuro:  No headache, numbness or weakness, balance or coordination issues. Endocrine:  No diabetes, thyroid issues, hot flashes or night sweats. Psych:  No mood changes, depression or anxiety. Pain:  No focal pain. Review of systems:  All other systems reviewed and found to be negative.                        Physical Exam: Blood pressure 132/82, pulse 101, temperature 95.2 F (35.1 C), temperature source Tympanic, height $RemoveBeforeDE'5\' 4"'YoBxKAHqARbwLqh$  (1.626 m), weight 182 lb 1.6 oz (82.6 kg).  GENERAL:  Well developed, well nourished, sitting comfortably in the exam room in no acute distress. MENTAL STATUS:  Alert and oriented to person, place and time. HEAD:  Wearing a blue wrap.  Alopecia totalis.  Normocephalic, atraumatic, face symmetric, no Cushingoid features. EYES:  Glasses.  Brown eyes.  Pupils equal round and reactive to light and accomodation.  No conjunctivitis or scleral icterus. ENT:  Oropharynx clear without lesion.  Tongue normal. Mucous membranes moist.  RESPIRATORY:  Clear to auscultation without rales, wheezes or rhonchi. CARDIOVASCULAR:  Regular rate and rhythm without murmur, rub or gallop. ABDOMEN:  Soft, non-tender, with active bowel sounds, and no hepatosplenomegaly.  No masses. SKIN:  No rashes, ulcers or lesions. EXTREMITIES: Mild bilateral lower extremity edema (right > left).  Left upper extremity edema without hand swelling.  No skin  discoloration or tenderness.  No palpable cords. LYMPH NODES: No palpable cervical, supraclavicular, axillary or inguinal adenopathy  NEUROLOGICAL: Unremarkable. PSYCH:  Appropriate.   Infusion on 06/07/2015  Component Date Value Ref Range Status  . WBC 06/07/2015 3.8  3.6 - 11.0 K/uL Final   A-LINE DRAW  . RBC 06/07/2015 3.45* 3.80 - 5.20 MIL/uL Final  . Hemoglobin 06/07/2015 10.3* 12.0 - 16.0 g/dL Final  . HCT 06/07/2015 32.3* 35.0 - 47.0 % Final  . MCV 06/07/2015 93.6  80.0 - 100.0 fL Final  . MCH 06/07/2015 29.9  26.0 - 34.0 pg Final  . MCHC 06/07/2015 31.9* 32.0 - 36.0 g/dL Final  . RDW 06/07/2015 19.5* 11.5 - 14.5 % Final  . Platelets 06/07/2015 301  150 - 440 K/uL Final  . Neutrophils Relative % 06/07/2015 52   Final  . Neutro Abs 06/07/2015 2.0  1.4 - 6.5 K/uL Final  . Lymphocytes Relative 06/07/2015 26   Final  . Lymphs Abs 06/07/2015 1.0  1.0 - 3.6 K/uL Final  . Monocytes Relative 06/07/2015 16   Final  . Monocytes Absolute 06/07/2015 0.6  0.2 - 0.9 K/uL Final  . Eosinophils Relative 06/07/2015 4   Final  . Eosinophils Absolute 06/07/2015 0.2  0 - 0.7 K/uL Final  . Basophils Relative 06/07/2015 2   Final  . Basophils Absolute 06/07/2015 0.1  0 - 0.1 K/uL Final   Assessment:  Cassidy Bradley is a 53 y.o. female 54 y.o. female with stage IV uterine leiomyosarcoma. She underwent TAH/BSO on 01/30/2015. Pathology confirmed high grade leiomyosarcoma with 2 large adjacent tumor nodules (10 cm and 5 cm) and a separate 0.5 cm serosal nodule.   Chest, abdomen, and pelvic CT scan on 02/12/2015 revealed a 1.7 x 2.3 cm right external iliac node. Pulmonary nodules were scattered throughout the lungs bilaterally. There was left internal mammary chain adenopathy measuring up to 3.6 x 2 cm.  She underwent anterior chest wall pleural-based CT guided biopsy on 02/21/2015. Pathology was consistent with metastatic leiomyosarcoma. Bone scan on 02/28/2015 revealed no evidence of metastatic  disease with indeterminate uptake in the left posterior rib.   She has a fraternal twin who developed breast cancer at age 48. Several other family members have malignancies. 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  Prechemotherapy labs from [facility] revealed leukopenia (WBC 2300 with ANC 900). Work-up to date is negative. She has a family history of leukopenia. Bone marrow on 03/08/2015 revealed no evidence of neoplasia or metastatic disease. Marrow was normocellular for age (30-50%) with trilineage hematopoiesis. There was no increase in marrow reticulin fibers. Flow cytometry and cytogenetics were normal.  She received 4 cycles of gemcitabine and Taxotere (03/20/2015 - 05/22/2015) with Neulasta support. She received gemcitabine alone on 05/22/2015.  She was admitted on 05/24/2015 with an acute febrile illness. All cultures were negative.   Chest CT on 05/25/2015 revealed early progressive disease. Abdominal and pelvic CT scan on 05/30/2015 revealed stable to slightly increased right external iliac lymph node (1.8 x 2.5 cm). Echocardiogram on 06/18/2015 revealed ejection fraction of 60-65%.  Bilateral lower extremity duplex on 05/30/2015 was negative  Symptomatically, she has new left upper extremity edema (mild).    Plan: 1.  Review labs from [facility]. 2.  Cycle #1 adriamycin today. 3.  Rx:  Decadron x 3 days for nausea. 4.  Rx:  Percocet 5/325 1 tablet q 6 hours prn pain; dis #30. 5.  Left upper extremity duplex- r/o DVT. 6.  RTC tomorrow for Neulasta. 7.  RTC in 3 weeks for MD assess, labs ([facility]), and cycle #2 adriamycin.   Lequita Asal, MD  06/01/2015, 5:10 PM

## 2015-07-24 ENCOUNTER — Telehealth: Payer: Self-pay | Admitting: *Deleted

## 2015-07-24 NOTE — Telephone Encounter (Signed)
Would like for [name] to give her a call back.Marland KitchenMarland Kitchen

## 2015-07-25 NOTE — Telephone Encounter (Signed)
Pt called inquiring about her long term disability forms; informed pt that they had been filled out and we will fax them today to [phone]

## 2015-07-30 ENCOUNTER — Encounter: Payer: Self-pay | Admitting: Hematology and Oncology

## 2015-08-01 ENCOUNTER — Emergency Department: Payer: 59

## 2015-08-01 ENCOUNTER — Telehealth: Payer: Self-pay

## 2015-08-01 ENCOUNTER — Other Ambulatory Visit: Payer: Self-pay

## 2015-08-01 ENCOUNTER — Encounter: Payer: Self-pay | Admitting: Medical Oncology

## 2015-08-01 ENCOUNTER — Emergency Department
Admission: EM | Admit: 2015-08-01 | Discharge: 2015-08-01 | Disposition: A | Discharge status: Home / Self Care | Payer: 59 | Attending: Emergency Medicine | Admitting: Emergency Medicine

## 2015-08-01 DIAGNOSIS — I10 Essential (primary) hypertension: Secondary | ICD-10-CM | POA: Insufficient documentation

## 2015-08-01 DIAGNOSIS — R079 Chest pain, unspecified: Secondary | ICD-10-CM | POA: Diagnosis present

## 2015-08-01 DIAGNOSIS — Z7952 Long term (current) use of systemic steroids: Secondary | ICD-10-CM | POA: Insufficient documentation

## 2015-08-01 DIAGNOSIS — Z79899 Other long term (current) drug therapy: Secondary | ICD-10-CM | POA: Diagnosis not present

## 2015-08-01 DIAGNOSIS — Z91040 Latex allergy status: Secondary | ICD-10-CM | POA: Diagnosis not present

## 2015-08-01 DIAGNOSIS — Z7901 Long term (current) use of anticoagulants: Secondary | ICD-10-CM | POA: Diagnosis not present

## 2015-08-01 DIAGNOSIS — K2100 Gastro-esophageal reflux disease with esophagitis, without bleeding: Secondary | ICD-10-CM

## 2015-08-01 LAB — TROPONIN I: TROPONIN I: 0.07 ng/mL — AB (ref <0.031)

## 2015-08-01 LAB — CBC
HCT: 33.8 % — ABNORMAL LOW (ref 35.0–47.0)
Hemoglobin: 11 g/dL — ABNORMAL LOW (ref 12.0–16.0)
MCH: 30.3 pg (ref 26.0–34.0)
MCHC: 32.6 g/dL (ref 32.0–36.0)
MCV: 93 fL (ref 80.0–100.0)
Platelets: 163 10*3/uL (ref 150–440)
RBC: 3.63 MIL/uL — ABNORMAL LOW (ref 3.80–5.20)
RDW: 16.1 % — ABNORMAL HIGH (ref 11.5–14.5)
WBC: 6.2 10*3/uL (ref 3.6–11.0)

## 2015-08-01 LAB — BASIC METABOLIC PANEL
ANION GAP: 6 (ref 5–15)
BUN: 7 mg/dL (ref 6–20)
CHLORIDE: 104 mmol/L (ref 101–111)
CO2: 27 mmol/L (ref 22–32)
Calcium: 9.1 mg/dL (ref 8.9–10.3)
Creatinine, Ser: 0.53 mg/dL (ref 0.44–1.00)
GFR calc non Af Amer: >60 mL/min (ref >60)
Glucose, Bld: 104 mg/dL — ABNORMAL HIGH (ref 65–99)
Potassium: 3.3 mmol/L — ABNORMAL LOW (ref 3.5–5.1)
Sodium: 137 mmol/L (ref 135–145)

## 2015-08-01 MED ORDER — RANITIDINE HCL 150 MG PO CAPS: 150.0000 mg | ORAL_CAPSULE | Freq: Two times a day (BID) | ORAL | Status: DC

## 2015-08-01 MED ORDER — SUCRALFATE 1 G PO TABS: 1.0000 g | ORAL_TABLET | Freq: Four times a day (QID) | ORAL | Status: DC

## 2015-08-01 MED ORDER — GI COCKTAIL ~~LOC~~
30.0000 mL | ORAL | Status: AC
  Administered 2015-08-01: 30 mL via ORAL
  Filled 2015-08-01: qty 30

## 2015-08-01 MED ORDER — FAMOTIDINE 20 MG PO TABS
40.0000 mg | ORAL_TABLET | Freq: Once | ORAL | Status: AC
  Administered 2015-08-01: 40 mg via ORAL
  Filled 2015-08-01: qty 2

## 2015-08-01 MED ORDER — HEPARIN SOD (PORK) LOCK FLUSH 100 UNIT/ML IV SOLN
INTRAVENOUS | Status: AC
  Filled 2015-08-01: qty 5

## 2015-08-01 NOTE — ED Notes (Signed)
Pt's port accessed at this time, 20 gauge 1 inch needle. Blood return noted, flushes with no concerns.

## 2015-08-01 NOTE — ED Notes (Signed)
Pt at Xray at this time 

## 2015-08-01 NOTE — Telephone Encounter (Signed)
Pt called in to [facility] clinic at 1340 having some feelings of chest fullness with some sharp pains.  Pt reports taking gas-ex a few hours ago and no complete relief yet.  Called and spoke with Dr. Grayland Ormond and he suggested patient to be seen in the ER to rule out any cardiac issues.  Called back and spoke with pt and she then reported and reminded me of  her hx of blood clots in her leg.  I told pt to please be seen in the ER to rule out any cardiac, PE, or possible clots.  Pt verbalized an understanding.

## 2015-08-01 NOTE — ED Notes (Signed)
Troponin drawn at this time. Pt made aware for need to stay until results. Verbalized understanding, no further needs at this time.

## 2015-08-01 NOTE — ED Notes (Signed)
Pt has reports that she began having midsternal chest pressure about 3 days ago- pain worsened today. Denies sob.

## 2015-08-01 NOTE — Discharge Instructions (Signed)
Gastroesophageal Reflux Disease, Adult Gastroesophageal reflux disease (GERD) happens when acid from your stomach flows up into the esophagus. When acid comes in contact with the esophagus, the acid causes soreness (inflammation) in the esophagus. Over time, GERD may create small holes (ulcers) in the lining of the esophagus. CAUSES   Increased body weight. This puts pressure on the stomach, making acid rise from the stomach into the esophagus.  Smoking. This increases acid production in the stomach.  Drinking alcohol. This causes decreased pressure in the lower esophageal sphincter (valve or ring of muscle between the esophagus and stomach), allowing acid from the stomach into the esophagus.  Late evening meals and a full stomach. This increases pressure and acid production in the stomach.  A malformed lower esophageal sphincter. Sometimes, no cause is found. SYMPTOMS   Burning pain in the lower part of the mid-chest behind the breastbone and in the mid-stomach area. This may occur twice a week or more often.  Trouble swallowing.  Sore throat.  Dry cough.  Asthma-like symptoms including chest tightness, shortness of breath, or wheezing. DIAGNOSIS  Your caregiver may be able to diagnose GERD based on your symptoms. In some cases, X-rays and other tests may be done to check for complications or to check the condition of your stomach and esophagus. TREATMENT  Your caregiver may recommend over-the-counter or prescription medicines to help decrease acid production. Ask your caregiver before starting or adding any new medicines.  HOME CARE INSTRUCTIONS   Change the factors that you can control. Ask your caregiver for guidance concerning weight loss, quitting smoking, and alcohol consumption.  Avoid foods and drinks that make your symptoms worse, such as:  Caffeine or alcoholic drinks.  Chocolate.  Peppermint or mint flavorings.  Garlic and onions.  Spicy foods.  Citrus fruits,  such as oranges, lemons, or limes.  Tomato-based foods such as sauce, chili, salsa, and pizza.  Fried and fatty foods.  Avoid lying down for the 3 hours prior to your bedtime or prior to taking a nap.  Eat small, frequent meals instead of large meals.  Wear loose-fitting clothing. Do not wear anything tight around your waist that causes pressure on your stomach.  Raise the head of your bed 6 to 8 inches with wood blocks to help you sleep. Extra pillows will not help.  Only take over-the-counter or prescription medicines for pain, discomfort, or fever as directed by your caregiver.  Do not take aspirin, ibuprofen, or other nonsteroidal anti-inflammatory drugs (NSAIDs). SEEK IMMEDIATE MEDICAL CARE IF:   You have pain in your arms, neck, jaw, teeth, or back.  Your pain increases or changes in intensity or duration.  You develop nausea, vomiting, or sweating (diaphoresis).  You develop shortness of breath, or you faint.  Your vomit is green, yellow, black, or looks like coffee grounds or blood.  Your stool is red, bloody, or black. These symptoms could be signs of other problems, such as heart disease, gastric bleeding, or esophageal bleeding. MAKE SURE YOU:   Understand these instructions.  Will watch your condition.  Will get help right away if you are not doing well or get worse. Document Released: 09/24/2005 Document Revised: 03/08/2012 Document Reviewed: 07/04/2011 [employer] Patient Information 2015 [employer]. This information is not intended to replace advice given to you by your health care provider. Make sure you discuss any questions you have with your health care provider.  

## 2015-08-01 NOTE — ED Notes (Signed)
Pt will be d/c following second troponin draw, depending on results. Pt and family verbalized understanding at this time, verbalized no further needs at this time.

## 2015-08-01 NOTE — ED Provider Notes (Addendum)
[facility] Emergency Department Provider Note  ____________________________________________  Time seen: 4:30 PM  I have reviewed the triage vital signs and the nursing notes.   HISTORY  Chief Complaint Chest Pain    HPI Cassidy Bradley is a 54 y.o. female who complains of midsternal chest pain that she describes as a burning sensation over the last 3-4 days. It seems to be better when she gets up and moves around, is not exertional nor pleuritic. No nausea vomiting diaphoresis or shortness of breath. Nonradiating. She feels the pain is due to gas and took Gas-X but did not resolve. She also has been having a sore throat for the last few days which is unexplained, and she is not having any other viral or upper respiratory illness symptoms.  She is on Xarelto for a DVT for the past 2-3 weeks which she has been taking faithfully and has not missed any doses.     Past Medical History  Diagnosis Date  . Breast lump 2006     both breast   . Chest wall mass   . Cancer   . Uterine cancer     Patient Active Problem List   Diagnosis Date Noted  . Left leg swelling 07/19/2015  . Acute deep vein thrombosis (DVT) of popliteal vein of left lower extremity 07/19/2015  . Drug reaction 05/26/2015  . Acute febrile illness 05/25/2015  . Elevated LFTs 05/25/2015  . Anemia 05/25/2015  . Sepsis 05/25/2015  . Anemia of chronic disease 05/25/2015  . Candidiasis of mouth 05/25/2015  . Pulmonary nodules   . 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 Encounter for screening colonoscopy 08/25/2014  . Lump or mass in breast 06/23/2013  . Essential hypertension, benign 03/30/2013  . Benign breast cyst in female 03/29/2013    Past Surgical History  Procedure Laterality Date  . Cholecystectomy  2001  . Ankle surgery  2005  . Cesarean section  1993  . Back surgery  2005  . Breast biopsy Bilateral   . Colonoscopy    . Abdominal hysterectomy  01/30/15    Current  Outpatient Rx  Name  Route  Sig  Dispense  Refill  . Alum & Mag Hydroxide-Simeth (MAGIC MOUTHWASH) SOLN   Oral   Take 5 mLs by mouth 3 (three) times daily as needed for mouth pain. Pt swishes and spits out medication.         Marland Kitchen dexamethasone (DECADRON) 4 MG tablet   Oral   Take 2 tablets (8 mg total) by mouth daily. beginning the day after chemotherapy for 3 days Patient taking differently: Take 8 mg by mouth daily. Pt begins using this the day after chemotherapy for 3 days.   20 tablet   0   . lidocaine-prilocaine (EMLA) cream   Topical   Apply 1 application topically as needed. Apply one hour prior to chemotherapy Patient taking differently: Apply 1 application topically as needed (prior to accessing pts port). Pt applies one hour prior to chemotherapy.   30 g   1   . loratadine (CLARITIN) 10 MG tablet   Oral   Take 10 mg by mouth daily.         . ondansetron (ZOFRAN) 8 MG tablet   Oral   Take 8 mg by mouth every 8 (eight) hours as needed for nausea or vomiting.          Marland Kitchen oxyCODONE-acetaminophen (PERCOCET/ROXICET) 5-325 MG per tablet   Oral   Take 1 tablet by mouth  every 6 (six) hours as needed for severe pain.   30 tablet   0   . Rivaroxaban (XARELTO STARTER PACK) 15 & 20 MG TBPK      Take as directed on package: Start with one 15mg  tablet by mouth twice a day with food. On Day 22, switch to one 20mg  tablet once a day with food. Patient taking differently: Take 15 mg by mouth 2 (two) times daily. Pt is currently taking 15mg  twice a day and on day 22 she will switch to taking 20mg  once a day.   51 each   0   . amoxicillin-clavulanate (AUGMENTIN) 875-125 MG per tablet   Oral   Take 1 tablet by mouth 2 (two) times daily. Patient not taking: Reported on 08/01/2015   10 tablet   0   . amoxicillin-clavulanate (AUGMENTIN) 875-125 MG per tablet   Oral   Take 1 tablet by mouth 2 (two) times daily. Patient not taking: Reported on 08/01/2015   14 tablet   0   .  nystatin (MYCOSTATIN) 100000 UNIT/ML suspension   Oral   Take 5 mLs (500,000 Units total) by mouth 4 (four) times daily. Patient not taking: Reported on 08/01/2015   100 mL   0   . ranitidine (ZANTAC) 150 MG capsule   Oral   Take 1 capsule (150 mg total) by mouth 2 (two) times daily.   28 capsule   0   . sucralfate (CARAFATE) 1 G tablet   Oral   Take 1 tablet (1 g total) by mouth 4 (four) times daily.   120 tablet   1     Allergies Pravastatin; Sulfa antibiotics; Latex; and Other  Family History  Problem Relation Age of Onset  . Breast cancer Sister 42  . Breast cancer Maternal Aunt   . Breast cancer Maternal Grandmother 60  . Stomach cancer Maternal Aunt   . Lung cancer Maternal Grandfather   . Colon polyps Mother   . Cancer - Colon Cousin 90    first cousin  . Lupus Father   . Esophageal cancer Maternal Uncle     Social History History  Substance Use Topics  . Smoking status: Never Smoker   . Smokeless tobacco: Never Used  . Alcohol Use: No    Review of Systems  Constitutional: No fever or chills. No weight changes Eyes:No blurry vision or double vision.  ENTPositivesore throat. Cardiovascular : positive chest pain. Respiratory: No dyspnea or cough. Gastrointestinal: Negative for abdominal pain, vomiting and diarrhea.  No BRBPR or melena. Genitourinary: Negative for dysuria, urinary retention, bloody urine, or difficulty urinating. Musculoskeletal: Negative for back pain. No joint swelling or pain. Skin: Negative for rash. Neurological: Negative for headaches, focal weakness or numbness. Psychiatric:No anxiety or depression.   Endocrine:No hot/cold intolerance, changes in energy, or sleep difficulty.  10-point ROS otherwise negative.  ____________________________________________   PHYSICAL EXAM:  VITAL SIGNS: ED Triage Vitals  Enc Vitals Group     BP 08/01/15 1431 130/78 mmHg     Pulse Rate 08/01/15 1431 93     Resp 08/01/15 1431 20     Temp  08/01/15 1431 98.1 F (36.7 C)     Temp Source 08/01/15 1431 Oral     SpO2 08/01/15 1431 100 %     Weight 08/01/15 1431 165 lb (74.844 kg)     Height 08/01/15 1431 5\' 4"  (1.626 m)     Head Cir --      Peak Flow --  Pain Score 08/01/15 1432 6     Pain Loc --      Pain Edu? --      Excl. in [place]? --      Constitutional: Alert and oriented. Well appearing and in no distress. Eyes: No scleral icterus. No conjunctival pallor. PERRL. EOMI ENT   Head: Normocephalic and atraumatic.   Nose: No congestion/rhinnorhea. No septal hematoma   Mouth/Throat: MMM, mild pharyngeal erythema. No peritonsillar mass. No uvula shift.   Neck: No stridor. No SubQ emphysema. No meningismus. Hematological/Lymphatic/Immunilogical: No cervical lymphadenopathy. Cardiovascular: RRR. Normal and symmetric distal pulses are present in all extremities. No murmurs, rubs, or gallops. Respiratory: Normal respiratory effort without tachypnea nor retractions. Breath sounds are clear and equal bilaterally. No wheezes/rales/rhonchi. Mild chest wall tenderness over the inferior sternum which reproduces the pain Gastrointestinal: Soft and nontender. No distention. There is no CVA tenderness.  No rebound, rigidity, or guarding. Genitourinary: deferred Musculoskeletal: Nontender with normal range of motion in all extremities. No joint effusions.  No lower extremity tenderness.  No edema. Neurologic:   Normal speech and language.  CN 2-10 normal. Motor grossly intact. No pronator drift.  Normal gait. No gross focal neurologic deficits are appreciated.  Skin:  Skin is warm, dry and intact. No rash noted.  No petechiae, purpura, or bullae. Psychiatric: Mood and affect are normal. Speech and behavior are normal. Patient exhibits appropriate insight and judgment.  ____________________________________________    LABS (pertinent positives/negatives) (all labs ordered are listed, but only abnormal results are  displayed) Labs Reviewed  BASIC METABOLIC PANEL - Abnormal; Notable for the following:    Potassium 3.3 (*)    Glucose, Bld 104 (*)    All other components within normal limits  CBC - Abnormal; Notable for the following:    RBC 3.63 (*)    Hemoglobin 11.0 (*)    HCT 33.8 (*)    RDW 16.1 (*)    All other components within normal limits  TROPONIN I - Abnormal; Notable for the following:    Troponin I 0.07 (*)    All other components within normal limits   ____________________________________________   EKG  Interpreted by me  Date: 08/01/2015  Rate: 94  Rhythm: normal sinus rhythm  QRS Axis: normal  Intervals: normal  ST/T Wave abnormalities: normal  Conduction Disutrbances: none  Narrative Interpretation: unremarkable      ____________________________________________    RADIOLOGY  Chest x-ray reveals improved left upper lung mass. No acute infiltrates or pneumothorax  ____________________________________________   PROCEDURES  ____________________________________________   INITIAL IMPRESSION / ASSESSMENT AND PLAN / ED COURSE  Pertinent labs & imaging results that were available during my care of the patient were reviewed by me and considered in my medical decision making (see chart for details).  Patient presents with chest discomfort that is very atypical and lung concerning for ACS PE TAD pneumothorax carditis mediastinitis pneumonia or sepsis. It is however very consistent with GERD. She has a normal EKG. Despite the mildly elevated troponin of 0.07 which I suspect is due to her chronic illness, have a very low suspicion for ACS or any condition causing heartstring, and I would not have felt necessary to order the troponin myself if i were seeing her from the start. I'll give the patient a GI cocktail and H2 blocker, and she is in agreement with this. We'll continue her on Zantac and have her follow up with primary care in a  week.  ____________________________________________   FINAL CLINICAL IMPRESSION(S) /  ED DIAGNOSES  Final diagnoses:  Gastroesophageal reflux disease with esophagitis      Carrie Mew, MD 08/01/15 1715  Repeat troponin negative and normal. We'll discharge home  Carrie Mew, MD 08/01/15 [phone]

## 2015-08-07 ENCOUNTER — Other Ambulatory Visit: Payer: 59

## 2015-08-07 ENCOUNTER — Inpatient Hospital Stay: Payer: 59 | Attending: Hematology and Oncology

## 2015-08-07 ENCOUNTER — Ambulatory Visit
Admission: RE | Admit: 2015-08-07 | Discharge: 2015-08-07 | Disposition: A | Discharge status: Home / Self Care | Payer: 59 | Source: Ambulatory Visit | Attending: Hematology and Oncology | Admitting: Hematology and Oncology

## 2015-08-07 DIAGNOSIS — C55 Malignant neoplasm of uterus, part unspecified: Secondary | ICD-10-CM | POA: Insufficient documentation

## 2015-08-07 DIAGNOSIS — Z08 Encounter for follow-up examination after completed treatment for malignant neoplasm: Secondary | ICD-10-CM | POA: Insufficient documentation

## 2015-08-07 DIAGNOSIS — Z79899 Other long term (current) drug therapy: Secondary | ICD-10-CM | POA: Insufficient documentation

## 2015-08-07 DIAGNOSIS — Z5111 Encounter for antineoplastic chemotherapy: Secondary | ICD-10-CM | POA: Insufficient documentation

## 2015-08-07 DIAGNOSIS — Z418 Encounter for other procedures for purposes other than remedying health state: Secondary | ICD-10-CM | POA: Diagnosis not present

## 2015-08-07 DIAGNOSIS — C782 Secondary malignant neoplasm of pleura: Secondary | ICD-10-CM | POA: Insufficient documentation

## 2015-08-07 DIAGNOSIS — Z9221 Personal history of antineoplastic chemotherapy: Secondary | ICD-10-CM | POA: Diagnosis not present

## 2015-08-07 DIAGNOSIS — C7800 Secondary malignant neoplasm of unspecified lung: Secondary | ICD-10-CM | POA: Insufficient documentation

## 2015-08-07 DIAGNOSIS — Z8542 Personal history of malignant neoplasm of other parts of uterus: Secondary | ICD-10-CM | POA: Diagnosis not present

## 2015-08-07 DIAGNOSIS — R911 Solitary pulmonary nodule: Secondary | ICD-10-CM | POA: Insufficient documentation

## 2015-08-07 DIAGNOSIS — Z90710 Acquired absence of both cervix and uterus: Secondary | ICD-10-CM | POA: Diagnosis not present

## 2015-08-07 DIAGNOSIS — K219 Gastro-esophageal reflux disease without esophagitis: Secondary | ICD-10-CM | POA: Insufficient documentation

## 2015-08-07 DIAGNOSIS — Z86718 Personal history of other venous thrombosis and embolism: Secondary | ICD-10-CM | POA: Diagnosis not present

## 2015-08-07 DIAGNOSIS — R918 Other nonspecific abnormal finding of lung field: Secondary | ICD-10-CM

## 2015-08-07 LAB — CREATININE, SERUM
Creatinine, Ser: 0.6 mg/dL (ref 0.44–1.00)
GFR calc Af Amer: >60 mL/min (ref >60)
GFR calc non Af Amer: >60 mL/min (ref >60)

## 2015-08-07 MED ORDER — IOHEXOL 300 MG/ML  SOLN
100.0000 mL | Freq: Once | INTRAMUSCULAR | Status: AC | PRN
  Administered 2015-08-07: 100 mL via INTRAVENOUS

## 2015-08-08 ENCOUNTER — Encounter: Payer: Self-pay | Admitting: Hematology and Oncology

## 2015-08-09 ENCOUNTER — Inpatient Hospital Stay: Payer: 59

## 2015-08-09 ENCOUNTER — Telehealth: Payer: Self-pay | Admitting: Pharmacist

## 2015-08-09 ENCOUNTER — Inpatient Hospital Stay (HOSPITAL_BASED_OUTPATIENT_CLINIC_OR_DEPARTMENT_OTHER): Payer: 59 | Admitting: Hematology and Oncology

## 2015-08-09 VITALS — BP 115/84 | HR 98 | Temp 95.3°F | Resp 17 | Ht 64.0 in | Wt 168.7 lb

## 2015-08-09 DIAGNOSIS — C801 Malignant (primary) neoplasm, unspecified: Secondary | ICD-10-CM

## 2015-08-09 DIAGNOSIS — K219 Gastro-esophageal reflux disease without esophagitis: Secondary | ICD-10-CM

## 2015-08-09 DIAGNOSIS — C55 Malignant neoplasm of uterus, part unspecified: Secondary | ICD-10-CM

## 2015-08-09 DIAGNOSIS — Z86718 Personal history of other venous thrombosis and embolism: Secondary | ICD-10-CM

## 2015-08-09 DIAGNOSIS — C7800 Secondary malignant neoplasm of unspecified lung: Secondary | ICD-10-CM

## 2015-08-09 DIAGNOSIS — IMO0001 Reserved for inherently not codable concepts without codable children: Secondary | ICD-10-CM

## 2015-08-09 DIAGNOSIS — C782 Secondary malignant neoplasm of pleura: Secondary | ICD-10-CM

## 2015-08-09 DIAGNOSIS — I82432 Acute embolism and thrombosis of left popliteal vein: Secondary | ICD-10-CM

## 2015-08-09 DIAGNOSIS — I82402 Acute embolism and thrombosis of unspecified deep veins of left lower extremity: Secondary | ICD-10-CM

## 2015-08-09 DIAGNOSIS — Z79899 Other long term (current) drug therapy: Secondary | ICD-10-CM

## 2015-08-09 MED ORDER — SODIUM CHLORIDE 0.9 % IJ SOLN
10.0000 mL | INTRAMUSCULAR | Status: DC | PRN
  Administered 2015-08-09: 10 mL
  Filled 2015-08-09: qty 10

## 2015-08-09 MED ORDER — SODIUM CHLORIDE 0.9 % IV SOLN
INTRAVENOUS | Status: DC
  Filled 2015-08-09: qty 1000

## 2015-08-09 MED ORDER — PALONOSETRON HCL INJECTION 0.25 MG/5ML
0.2500 mg | Freq: Once | INTRAVENOUS | Status: AC
Start: 2015-08-09 — End: 2015-08-09
  Administered 2015-08-09: 0.25 mg via INTRAVENOUS
  Filled 2015-08-09: qty 5

## 2015-08-09 MED ORDER — SODIUM CHLORIDE 0.9 % IV SOLN
INTRAVENOUS | Status: DC
  Administered 2015-08-09: 11:00:00 via INTRAVENOUS
  Filled 2015-08-09: qty 1000

## 2015-08-09 MED ORDER — PEGFILGRASTIM 6 MG/0.6ML ~~LOC~~ PSKT
6.0000 mg | PREFILLED_SYRINGE | Freq: Once | SUBCUTANEOUS | Status: AC
  Administered 2015-08-09: 6 mg via SUBCUTANEOUS
  Filled 2015-08-09: qty 0.6

## 2015-08-09 MED ORDER — DOXORUBICIN HCL CHEMO IV INJECTION 2 MG/ML
60.0000 mg/m2 | Freq: Once | INTRAVENOUS | Status: AC
  Administered 2015-08-09: 116 mg via INTRAVENOUS
  Filled 2015-08-09: qty 58

## 2015-08-09 MED ORDER — HEPARIN SOD (PORK) LOCK FLUSH 100 UNIT/ML IV SOLN
500.0000 [IU] | Freq: Once | INTRAVENOUS | Status: AC | PRN
  Administered 2015-08-09: 500 [IU]
  Filled 2015-08-09: qty 5

## 2015-08-09 MED ORDER — RIVAROXABAN 20 MG PO TABS: 20.0000 mg | ORAL_TABLET | Freq: Every day | ORAL | Status: DC

## 2015-08-09 MED ORDER — SODIUM CHLORIDE 0.9 % IV SOLN
Freq: Once | INTRAVENOUS | Status: AC
  Administered 2015-08-09: 12:00:00 via INTRAVENOUS
  Filled 2015-08-09: qty 5

## 2015-08-09 NOTE — Progress Notes (Signed)
Pt has swelling in left lower legg w/ complaints of feeling "funny".  Also has some tenderness under right breast and feels swollen to the pt.  Pt recently seen in ER for a diagnosis of Acid Reflux per pt.

## 2015-08-09 NOTE — Telephone Encounter (Signed)
Reviewed [facility] labs from 08/08/15. Neutrophil # = 1.5 and Platelets 301. Will proceed with treatment.

## 2015-08-11 ENCOUNTER — Encounter: Payer: Self-pay | Admitting: Hematology and Oncology

## 2015-08-11 DIAGNOSIS — IMO0001 Reserved for inherently not codable concepts without codable children: Secondary | ICD-10-CM | POA: Insufficient documentation

## 2015-08-11 DIAGNOSIS — I82409 Acute embolism and thrombosis of unspecified deep veins of unspecified lower extremity: Secondary | ICD-10-CM | POA: Insufficient documentation

## 2015-08-11 DIAGNOSIS — K219 Gastro-esophageal reflux disease without esophagitis: Secondary | ICD-10-CM

## 2015-08-11 NOTE — Progress Notes (Signed)
[place] [facility] day:  08/09/2015  Chief Complaint: Cassidy Bradley is an 54 y.o. female with metastatic uterine leiomyosarcoma who is seen for review of interval restaging studies and assessment prior to cycle #4 adriamycin.  HPI: The patient was last seen in the medical oncology clinic on 07/19/2015.  At that time, she received cycle #3 adriamycin with Neulasta support (OnPro).  She tolerated her chemotherapy well.  At that visit, she noted left leg discomfort for 1 week.   Left lower extremity duplex revealed an acute DVT in the popliteal vein.  She began Xarelto (15 mg BID x 3 weeks then 20 mg a day).  She presented to the emergency room on 08/01/2015 with chest pain.  She was diagnosed with acid reflux.  She was prescribed Carafate and ranitidine.  She underwent chest, abdomen, and pelvic CT scan on 08/07/2015.  Imaging revealed a partial treatment response with decrease anterior left upper pleural metastasis, stable to decreased pulmonary metastases and decreased right external iliac adenopathy. There were no new sites of disease.  Labs on 08/08/2015 from [facility] included a hematocrit of 35.7, hemoglobin 11.4, platelets 301,000, white count 2900 with an ANC of 1500. Comprehensive metabolic panel included a creatinine of 0.56. Liver function tests were normal.  Symptomatically, she notes that her leg is still swollen.  Her reflux is being managed.   Past Medical History  Diagnosis Date  . Breast lump 2006     both breast   . Chest wall mass   . Cancer   . Uterine cancer     Past Surgical History  Procedure Laterality Date  . Cholecystectomy  2001  . Ankle surgery  2005  . Cesarean section  1993  . Back surgery  2005  . Breast biopsy Bilateral   . Colonoscopy    . Abdominal hysterectomy  01/30/15   Family History  Problem Relation Age of Onset  . Breast cancer Sister 8  . Breast cancer Maternal Aunt   . Breast cancer Maternal  Grandmother 60  . Stomach cancer Maternal Aunt   . Lung cancer Maternal Grandfather   . Colon polyps Mother   . Cancer - Colon Cousin 11    first cousin  . Lupus Father   . Esophageal cancer Maternal Uncle     Social History:  reports that she has never smoked. She has never used smokeless tobacco. She reports that she does not drink alcohol or use illicit drugs.  The patient is accompanied by her husband.  Allergies:  Allergies  Allergen Reactions  . Pravastatin Hives and Nausea And Vomiting  . Sulfa Antibiotics Hives and Swelling  . Latex Rash  . Other Rash and Other (See Comments)    Pt states that she is allergic to spandex.   Current Medications: Current Outpatient Prescriptions  Medication Sig Dispense Refill  . Alum & Mag Hydroxide-Simeth (MAGIC MOUTHWASH) SOLN Take 5 mLs by mouth 3 (three) times daily as needed for mouth pain. Pt swishes and spits out medication.    Marland Kitchen dexamethasone (DECADRON) 4 MG tablet Take 2 tablets (8 mg total) by mouth daily. beginning the day after chemotherapy for 3 days (Patient taking differently: Take 8 mg by mouth daily. Pt begins using this the day after chemotherapy for 3 days.) 20 tablet 0  . lidocaine-prilocaine (EMLA) cream Apply 1 application topically as needed. Apply one hour prior to chemotherapy (Patient taking differently: Apply 1 application topically as needed (prior to accessing  pts port). Pt applies one hour prior to chemotherapy.) 30 g 1  . loratadine (CLARITIN) 10 MG tablet Take 10 mg by mouth daily.    Marland Kitchen nystatin (MYCOSTATIN) 100000 UNIT/ML suspension Take 5 mLs (500,000 Units total) by mouth 4 (four) times daily. 100 mL 0  . ondansetron (ZOFRAN) 8 MG tablet Take 8 mg by mouth every 8 (eight) hours as needed for nausea or vomiting.     Marland Kitchen oxyCODONE-acetaminophen (PERCOCET/ROXICET) 5-325 MG per tablet Take 1 tablet by mouth every 6 (six) hours as needed for severe pain. 30 tablet 0  . ranitidine (ZANTAC) 150 MG capsule Take 1 capsule  (150 mg total) by mouth 2 (two) times daily. 28 capsule 0  . sucralfate (CARAFATE) 1 G tablet Take 1 tablet (1 g total) by mouth 4 (four) times daily. 120 tablet 1  . XARELTO 20 MG TABS tablet ON DAY 22 SWITCH TO ONE 20MG TABLET ONCE A DAY WITH FOOD  0  . rivaroxaban (XARELTO) 20 MG TABS tablet Take 1 tablet (20 mg total) by mouth daily with supper. 30 tablet 3   No current facility-administered medications for this visit.   Facility-Administered Medications Ordered in Other Visits  Medication Dose Route Frequency Provider Last Rate Last Dose  . 0.9 %  sodium chloride infusion   Intravenous Continuous Lequita Asal, MD 125 mL/hr at 06/28/15 1309    . sodium chloride 0.9 % injection 10 mL  10 mL Intracatheter PRN Lequita Asal, MD      . sodium chloride 0.9 % injection 10 mL  10 mL Intracatheter PRN Lequita Asal, MD   10 mL at 06/28/15 1045   Review of Systems:  GENERAL:  Feels "ok".  No fevers or sweats.  Weight up 3 pounds. PERFORMANCE STATUS (ECOG):  1-2 HEENT:  No visual changes, runny nose, sore throat, mouth sores or tenderness. Lungs: No shortness of breath or cough.  No hemoptysis. Cardiac:  Chest discomfort.  No chest pain, palpitations, orthopnea, or PND. GI:  Reflux.  No nausea, vomiting, diarrhea, constipation, melena or hematochezia. GU:  No urgency, frequency, dysuria, or hematuria. Musculoskeletal:  No back pain.  No joint pain.  No muscle tenderness. Extremities:  Left lower extremity edema. Skin:  No rashes or skin changes. Neuro:  No headache, numbness or weakness, balance or coordination issues. Endocrine:  No diabetes, thyroid issues, hot flashes or night sweats. Psych:  No mood changes, depression or anxiety. Pain:  No focal pain. Review of systems:  All other systems reviewed and found to be negative.                        Physical Exam: Blood pressure 115/84, pulse 98, temperature 95.3 F (35.2 C), temperature source Tympanic, resp. rate 17,  height _0  (1.626 m), weight 168 lb 10.4 oz (76.5 kg).  GENERAL:  Well developed, well nourished, sitting comfortably in the exam room in no acute distress. MENTAL STATUS:  Alert and oriented to person, place and time. HEAD:  Alopecia totalis.  Normocephalic, atraumatic, face symmetric, no Cushingoid features. EYES:  Brown eyes.  Pupils equal round and reactive to light and accomodation.  No conjunctivitis or scleral icterus. ENT:  Oropharynx clear without lesion.  Tongue normal. Mucous membranes moist.  RESPIRATORY:  Clear to auscultation without rales, wheezes or rhonchi. CARDIOVASCULAR:  Regular rate and rhythm without murmur, rub or gallop. ABDOMEN:  Soft, non-tender, with active bowel sounds, and no hepatosplenomegaly.  No  masses. SKIN:  No rashes, ulcers or lesions. EXTREMITIES:  Left lower extremity edema with tenderness to palpation posteriorly.  No skin discoloration.  No erythema. LYMPH NODES: No palpable cervical, supraclavicular, axillary or inguinal adenopathy  NEUROLOGICAL: Unremarkable. PSYCH:  Appropriate.  Appointment on 08/07/2015  Component Date Value Ref Range Status  . Creatinine, Ser 08/07/2015 0.60  0.44 - 1.00 mg/dL Final  . GFR calc non Af Amer 08/07/2015 >60  >60 mL/min Final  . GFR calc Af Amer 08/07/2015 >60  >60 mL/min Final   Comment: (NOTE) The eGFR has been calculated using the CKD EPI equation. This calculation has not been validated in all clinical situations. eGFR's persistently <60 mL/min signify possible Chronic Kidney Disease.    Assessment:  JADALYN OLIVERI is a 54 y.o. female 54 y.o. female with stage IV uterine leiomyosarcoma. She underwent TAH/BSO on 01/30/2015. Pathology confirmed high grade leiomyosarcoma with 2 large adjacent tumor nodules (10 cm and 5 cm) and a separate 0.5 cm serosal nodule.   Chest, abdomen, and pelvic CT scan on 02/12/2015 revealed a 1.7 x 2.3 cm right external iliac node. Pulmonary nodules were scattered throughout  the lungs bilaterally. There was left internal mammary chain adenopathy measuring up to 3.6 x 2 cm.  She underwent anterior chest wall pleural-based CT guided biopsy on 02/21/2015. Pathology was consistent with metastatic leiomyosarcoma. Bone scan on 02/28/2015 revealed no evidence of metastatic disease with indeterminate uptake in the left posterior rib.   She has a fraternal twin who developed breast cancer at age 69. Several other family members have malignancies. 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  Prechemotherapy labs from [facility] revealed leukopenia (WBC 2300 with ANC 900). Work-up to date is negative. She has a family history of leukopenia. Bone marrow on 03/08/2015 revealed no evidence of neoplasia or metastatic disease. Marrow was normocellular for age (30-50%) with trilineage hematopoiesis. There was no increase in marrow reticulin fibers. Flow cytometry and cytogenetics were normal.  She received 4 cycles of gemcitabine and Taxotere (03/20/2015 - 05/22/2015) with Neulasta support. She received gemcitabine alone on 05/22/2015.  She was admitted on 05/24/2015 with an acute febrile illness. All cultures were negative.   Chest CT on 05/25/2015 revealed early progressive disease. Abdominal and pelvic CT scan on 05/30/2015 revealed stable to slightly increased right external iliac lymph node (1.8 x 2.5 cm). Echocardiogram on 06/18/2015 revealed ejection fraction of 60-65%.  Bilateral lower extremity duplex on 05/30/2015 was negative.  Left upper extremity duplex on 06/08/2015 was negative.  She is status post 3 cycles of adriamycin (06/07/2015 - 07/19/2015) with Neulasta (On-Pro) support.  Cycle #3 was complicated by an acute  left lower extremity DVT documented on 07/19/2015.  She is on Xarelto.  She was seen in the ER on 08/01/2015 with severe reflux.  Chest, abdomen, and pelvic CT scan on 08/07/2015  revealed a partial treatment response with decrease anterior left upper pleural metastasis, stable to decreased pulmonary metastases and decreased right external iliac adenopathy. There were no new sites of disease.  Symptomatically, she is feels "ok".  She has persistent swelling in the left leg with decreased tenderness.   Plan: 1.  Review restaging studies and [facility] labs. 2.  Cycle #4 adriamycin today. 3.  Neulasta support (On-Pro). 4.  Continue Xarelto 20 mg a day. 5.  Discuss use of omeprazole rather than ranitidine. 6.  Slip for [facility] labs in 10 days and prior to next cycle. 7.  RTC  in 3 weeks for MD assessment, review of [facility] labs, and cycle #5 adriamycin.   Lequita Asal, MD 08/09/2015

## 2015-08-20 ENCOUNTER — Encounter: Payer: Self-pay | Admitting: Hematology and Oncology

## 2015-08-30 ENCOUNTER — Inpatient Hospital Stay (HOSPITAL_BASED_OUTPATIENT_CLINIC_OR_DEPARTMENT_OTHER): Payer: 59 | Admitting: Hematology and Oncology

## 2015-08-30 ENCOUNTER — Inpatient Hospital Stay: Payer: 59 | Attending: Hematology and Oncology

## 2015-08-30 ENCOUNTER — Inpatient Hospital Stay: Payer: 59

## 2015-08-30 ENCOUNTER — Telehealth: Payer: Self-pay | Admitting: *Deleted

## 2015-08-30 VITALS — BP 140/90 | HR 83 | Temp 95.7°F | Ht 64.0 in | Wt 169.3 lb

## 2015-08-30 DIAGNOSIS — K219 Gastro-esophageal reflux disease without esophagitis: Secondary | ICD-10-CM | POA: Insufficient documentation

## 2015-08-30 DIAGNOSIS — Z79899 Other long term (current) drug therapy: Secondary | ICD-10-CM | POA: Insufficient documentation

## 2015-08-30 DIAGNOSIS — Z418 Encounter for other procedures for purposes other than remedying health state: Secondary | ICD-10-CM | POA: Insufficient documentation

## 2015-08-30 DIAGNOSIS — C782 Secondary malignant neoplasm of pleura: Secondary | ICD-10-CM

## 2015-08-30 DIAGNOSIS — C7800 Secondary malignant neoplasm of unspecified lung: Secondary | ICD-10-CM | POA: Diagnosis not present

## 2015-08-30 DIAGNOSIS — K5900 Constipation, unspecified: Secondary | ICD-10-CM | POA: Diagnosis not present

## 2015-08-30 DIAGNOSIS — Z5111 Encounter for antineoplastic chemotherapy: Secondary | ICD-10-CM | POA: Insufficient documentation

## 2015-08-30 DIAGNOSIS — C55 Malignant neoplasm of uterus, part unspecified: Secondary | ICD-10-CM

## 2015-08-30 DIAGNOSIS — Z86718 Personal history of other venous thrombosis and embolism: Secondary | ICD-10-CM | POA: Insufficient documentation

## 2015-08-30 MED ORDER — PALONOSETRON HCL INJECTION 0.25 MG/5ML
0.2500 mg | Freq: Once | INTRAVENOUS | Status: AC
  Administered 2015-08-30: 0.25 mg via INTRAVENOUS
  Filled 2015-08-30: qty 5

## 2015-08-30 MED ORDER — HEPARIN SOD (PORK) LOCK FLUSH 100 UNIT/ML IV SOLN
500.0000 [IU] | Freq: Once | INTRAVENOUS | Status: AC | PRN
  Administered 2015-08-30: 500 [IU]
  Filled 2015-08-30: qty 5

## 2015-08-30 MED ORDER — MAGIC MOUTHWASH: 5.0000 mL | Freq: Three times a day (TID) | ORAL | Status: DC | PRN

## 2015-08-30 MED ORDER — DOXORUBICIN HCL CHEMO IV INJECTION 2 MG/ML
60.0000 mg/m2 | Freq: Once | INTRAVENOUS | Status: AC
  Administered 2015-08-30: 116 mg via INTRAVENOUS
  Filled 2015-08-30: qty 58

## 2015-08-30 MED ORDER — SODIUM CHLORIDE 0.9 % IV SOLN
INTRAVENOUS | Status: DC
  Administered 2015-08-30: 11:00:00 via INTRAVENOUS
  Filled 2015-08-30: qty 1000

## 2015-08-30 MED ORDER — SODIUM CHLORIDE 0.9 % IV SOLN
Freq: Once | INTRAVENOUS | Status: AC
  Administered 2015-08-30: 11:00:00 via INTRAVENOUS
  Filled 2015-08-30: qty 5

## 2015-08-30 MED ORDER — PEGFILGRASTIM 6 MG/0.6ML ~~LOC~~ PSKT
6.0000 mg | PREFILLED_SYRINGE | Freq: Once | SUBCUTANEOUS | Status: AC
  Administered 2015-08-30: 6 mg via SUBCUTANEOUS
  Filled 2015-08-30: qty 0.6

## 2015-08-30 NOTE — Progress Notes (Signed)
[facility] day:  08/30/2015   Chief Complaint: Cassidy Bradley is an 54 y.o. female with metastatic uterine leiomyosarcoma who is seen for assessment prior to cycle #5 adriamycin.  HPI: The patient was last seen in the medical oncology clinic on 08/09/2015.  At that time, imaging studies from 08/07/2015 had revealed a partial treatment response. She was on Xarelto for left lower extremity DVT. She received cycle #4 Adriamycin with Neulasta support.  It was recommended that she take omeprazole rather than ranitidine for her reflux secondary to her baseline low counts.  During the interim, she has done well. She notes some constipation. He is eating more vegetables. She started MiraLAX yesterday. She is also drinking prune juice as well as a mixture of apple and pineapple juice. She had a little bit of a blood on her stool which she thinks is due to hemorrhoids.    Past Medical History  Diagnosis Date  . Breast lump 2006     both breast   . Chest wall mass   . Cancer   . Uterine cancer     Past Surgical History  Procedure Laterality Date  . Cholecystectomy  2001  . Ankle surgery  2005  . Cesarean section  1993  . Back surgery  2005  . Breast biopsy Bilateral   . Colonoscopy    . Abdominal hysterectomy  01/30/15   Family History  Problem Relation Age of Onset  . Breast cancer Sister 57  . Breast cancer Maternal Aunt   . Breast cancer Maternal Grandmother 60  . Stomach cancer Maternal Aunt   . Lung cancer Maternal Grandfather   . Colon polyps Mother   . Cancer - Colon Cousin 73    first cousin  . Lupus Father   . Esophageal cancer Maternal Uncle     Social History:  reports that she has never smoked. She has never used smokeless tobacco. She reports that she does not drink alcohol or use illicit drugs.  The patient is alone today.  Allergies:  Allergies  Allergen Reactions  . Pravastatin Hives and Nausea And Vomiting  . Sulfa  Antibiotics Hives and Swelling  . Latex Rash  . Other Rash and Other (See Comments)    Pt states that she is allergic to spandex.   Current Medications: Current Outpatient Prescriptions  Medication Sig Dispense Refill  . dexamethasone (DECADRON) 4 MG tablet Take 2 tablets (8 mg total) by mouth daily. beginning the day after chemotherapy for 3 days (Patient taking differently: Take 8 mg by mouth daily. Pt begins using this the day after chemotherapy for 3 days.) 20 tablet 0  . lidocaine-prilocaine (EMLA) cream Apply 1 application topically as needed. Apply one hour prior to chemotherapy (Patient taking differently: Apply 1 application topically as needed (prior to accessing pts port). Pt applies one hour prior to chemotherapy.) 30 g 1  . loratadine (CLARITIN) 10 MG tablet Take 10 mg by mouth daily.    . ondansetron (ZOFRAN) 8 MG tablet Take 8 mg by mouth every 8 (eight) hours as needed for nausea or vomiting.     Marland Kitchen oxyCODONE-acetaminophen (PERCOCET/ROXICET) 5-325 MG per tablet Take 1 tablet by mouth every 6 (six) hours as needed for severe pain. 30 tablet 0  . rivaroxaban (XARELTO) 20 MG TABS tablet Take 1 tablet (20 mg total) by mouth daily with supper. 30 tablet 3  . magic mouthwash SOLN Take 5 mLs by mouth 3 (three) times  daily as needed for mouth pain. Pt swishes and spits out medication. 240 mL 3  . nystatin (MYCOSTATIN) 100000 UNIT/ML suspension Take 5 mLs (500,000 Units total) by mouth 4 (four) times daily. (Patient not taking: Reported on 08/30/2015) 100 mL 0  . ranitidine (ZANTAC) 150 MG capsule Take 1 capsule (150 mg total) by mouth 2 (two) times daily. (Patient not taking: Reported on 08/30/2015) 28 capsule 0  . sucralfate (CARAFATE) 1 G tablet Take 1 tablet (1 g total) by mouth 4 (four) times daily. (Patient not taking: Reported on 08/30/2015) 120 tablet 1  . XARELTO 20 MG TABS tablet ON DAY 22 SWITCH TO ONE $Remo'20MG'vIgwf$  TABLET ONCE A DAY WITH FOOD  0   No current facility-administered medications  for this visit.   Facility-Administered Medications Ordered in Other Visits  Medication Dose Route Frequency Provider Last Rate Last Dose  . 0.9 %  sodium chloride infusion   Intravenous Continuous Lequita Asal, MD 125 mL/hr at 06/28/15 1309    . sodium chloride 0.9 % injection 10 mL  10 mL Intracatheter PRN Lequita Asal, MD      . sodium chloride 0.9 % injection 10 mL  10 mL Intracatheter PRN Lequita Asal, MD   10 mL at 06/28/15 1045   Review of Systems:  GENERAL:  Feels good.  No fevers or sweats.  Weight up 1 pound. PERFORMANCE STATUS (ECOG):  1-2 HEENT:  No visual changes, runny nose, sore throat, mouth sores or tenderness. Lungs: No shortness of breath or cough.  No hemoptysis. Cardiac:  Chest discomfort.  No chest pain, palpitations, orthopnea, or PND. GI:  Reflux.  Constipation.  Blood on outside of stool.  No nausea, vomiting, diarrhea, or melena. GU:  No urgency, frequency, dysuria, or hematuria. Musculoskeletal:  No back pain.  No joint pain.  No muscle tenderness. Extremities:  Left lower extremity edema improved.  No bone pain. Skin:  No rashes or skin changes. Neuro:  No headache, numbness or weakness, balance or coordination issues. Endocrine:  No diabetes, thyroid issues, hot flashes or night sweats. Psych:  No mood changes, depression or anxiety. Pain:  No focal pain. Review of systems:  All other systems reviewed and found to be negative.                        Physical Exam: Blood pressure 140/90, pulse 83, temperature 95.7 F (35.4 C), temperature source Tympanic, height $RemoveBeforeDE'5\' 4"'SieFAzdfFeaWjJX$  (1.626 m), weight 169 lb 5 oz (76.8 kg).  GENERAL:  Well developed, well nourished, sitting comfortably in the exam room in no acute distress. MENTAL STATUS:  Alert and oriented to person, place and time. HEAD:  Wearing a cap.  Alopecia totalis.  Normocephalic, atraumatic, face symmetric, no Cushingoid features. EYES:  Brown eyes.  Pupils equal round and reactive to light and  accomodation.  No conjunctivitis or scleral icterus. ENT:  Oropharynx clear without lesion.  Tongue normal. Mucous membranes moist.  RESPIRATORY:  Clear to auscultation without rales, wheezes or rhonchi. CARDIOVASCULAR:  Regular rate and rhythm without murmur, rub or gallop. ABDOMEN:  Soft, non-tender, with active bowel sounds, and no hepatosplenomegaly.  No masses. RECTAL:  Two small hemorrhoids, non bleeding. SKIN:  No rashes, ulcers or lesions. EXTREMITIES:  Mild left lower extremity edema.  No skin discoloration.  No erythema. LYMPH NODES: No palpable cervical, supraclavicular, axillary or inguinal adenopathy  NEUROLOGICAL: Unremarkable. PSYCH:  Appropriate.  Laboratory testing: [facility] labs on 08/29/2015 included a  hematocrit 35.8, hemoglobin 11.6, platelets 333,000, white count 2900 with an ANC of 1400. Comprehensive metabolic panel included a creatinine of 0.68. Liver function tests were normal.  No visits with results within 3 Day(s) from this visit. Latest known visit with results is:  Appointment on 08/07/2015  Component Date Value Ref Range Status  . Creatinine, Ser 08/07/2015 0.60  0.44 - 1.00 mg/dL Final  . GFR calc non Af Amer 08/07/2015 >60  >60 mL/min Final  . GFR calc Af Amer 08/07/2015 >60  >60 mL/min Final   Comment: (NOTE) The eGFR has been calculated using the CKD EPI equation. This calculation has not been validated in all clinical situations. eGFR's persistently <60 mL/min signify possible Chronic Kidney Disease.    Assessment:  KERINGTON HILDEBRANT is a 54 y.o. female 54 y.o. female with stage IV uterine leiomyosarcoma. She underwent TAH/BSO on 01/30/2015. Pathology confirmed high grade leiomyosarcoma with 2 large adjacent tumor nodules (10 cm and 5 cm) and a separate 0.5 cm serosal nodule.   Chest, abdomen, and pelvic CT scan on 02/12/2015 revealed a 1.7 x 2.3 cm right external iliac node. Pulmonary nodules were scattered throughout the lungs bilaterally.  There was left internal mammary chain adenopathy measuring up to 3.6 x 2 cm.  She underwent anterior chest wall pleural-based CT guided biopsy on 02/21/2015. Pathology was consistent with metastatic leiomyosarcoma. Bone scan on 02/28/2015 revealed no evidence of metastatic disease with indeterminate uptake in the left posterior rib.   She has a fraternal twin who developed breast cancer at age 61. Several other family members have malignancies. 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  Prechemotherapy labs from [facility] revealed leukopenia (WBC 2300 with ANC 900). Work-up to date is negative. She has a family history of leukopenia. Bone marrow on 03/08/2015 revealed no evidence of neoplasia or metastatic disease. Marrow was normocellular for age (30-50%) with trilineage hematopoiesis. There was no increase in marrow reticulin fibers. Flow cytometry and cytogenetics were normal.  She received 4 cycles of gemcitabine and Taxotere (03/20/2015 - 05/22/2015) with Neulasta support. She received gemcitabine alone on 05/22/2015.  She was admitted on 05/24/2015 with an acute febrile illness. All cultures were negative.   Chest CT on 05/25/2015 revealed early progressive disease. Abdominal and pelvic CT scan on 05/30/2015 revealed stable to slightly increased right external iliac lymph node (1.8 x 2.5 cm). Echocardiogram on 06/18/2015 revealed ejection fraction of 60-65%.  Bilateral lower extremity duplex on 05/30/2015 was negative.  Left upper extremity duplex on 06/08/2015 was negative.  Left lower extremity duplex on 07/19/2015 documented a DVT.  She is on Xarelto.   She is status post 4 cycles of adriamycin (06/07/2015 - 08/09/2015) with Neulasta (On-Pro) support.  Cycle #3 was complicated by an acute  left lower extremity DVT.  Chest, abdomen, and pelvic CT scan on 08/07/2015 revealed a partial treatment response with  decrease anterior left upper pleural metastasis, stable to decreased pulmonary metastases and decreased right external iliac adenopathy. There were no new sites of disease.  Symptomatically, she feels good.  She has had some issues with reflux and constipation.  Exam is stable.   Plan: 1.  Review restaging studies and [facility] labs. 2.  Cycle #5 adriamycin today. 3.  Neulasta support (On-Pro). 4.  Continue Xarelto 20 mg a day. 5.  Complete slips for [facility] labs. 6.  Discuss plan for repeat imaging after 6 cycles. 7.  RTC in 3 weeks for MD assessment, review  of [facility] labs, and cycle #6 adriamycin.   Lequita Asal, MD 08/30/2015, 11:12 PM

## 2015-08-30 NOTE — Progress Notes (Signed)
Patient here for follow up complaints of severe constipation despite OTC treatments and increase fluid intake in addition stools have been bloody and rectal pain.

## 2015-08-31 ENCOUNTER — Other Ambulatory Visit: Payer: Self-pay

## 2015-08-31 ENCOUNTER — Telehealth: Payer: Self-pay | Admitting: *Deleted

## 2015-08-31 MED ORDER — RIVAROXABAN 20 MG PO TABS: 20.0000 mg | ORAL_TABLET | Freq: Every day | ORAL | Status: DC

## 2015-08-31 NOTE — Telephone Encounter (Signed)
Patient has dental appointment next Friday.  Wants to know if it is okay for her to keep that appointment?

## 2015-09-02 ENCOUNTER — Encounter: Payer: Self-pay | Admitting: Hematology and Oncology

## 2015-09-16 ENCOUNTER — Other Ambulatory Visit: Payer: Self-pay | Admitting: Hematology and Oncology

## 2015-09-20 ENCOUNTER — Encounter: Payer: Self-pay | Admitting: Hematology and Oncology

## 2015-09-20 ENCOUNTER — Other Ambulatory Visit: Payer: Self-pay

## 2015-09-20 ENCOUNTER — Inpatient Hospital Stay: Payer: 59

## 2015-09-20 ENCOUNTER — Inpatient Hospital Stay (HOSPITAL_BASED_OUTPATIENT_CLINIC_OR_DEPARTMENT_OTHER): Payer: 59 | Admitting: Hematology and Oncology

## 2015-09-20 VITALS — BP 116/83 | HR 101 | Temp 97.7°F | Resp 20 | Ht 64.0 in | Wt 170.0 lb

## 2015-09-20 DIAGNOSIS — Z86718 Personal history of other venous thrombosis and embolism: Secondary | ICD-10-CM

## 2015-09-20 DIAGNOSIS — K219 Gastro-esophageal reflux disease without esophagitis: Secondary | ICD-10-CM | POA: Diagnosis not present

## 2015-09-20 DIAGNOSIS — D709 Neutropenia, unspecified: Secondary | ICD-10-CM

## 2015-09-20 DIAGNOSIS — K5900 Constipation, unspecified: Secondary | ICD-10-CM

## 2015-09-20 DIAGNOSIS — C55 Malignant neoplasm of uterus, part unspecified: Secondary | ICD-10-CM | POA: Diagnosis not present

## 2015-09-20 DIAGNOSIS — I82432 Acute embolism and thrombosis of left popliteal vein: Secondary | ICD-10-CM

## 2015-09-20 DIAGNOSIS — C782 Secondary malignant neoplasm of pleura: Secondary | ICD-10-CM | POA: Diagnosis not present

## 2015-09-20 DIAGNOSIS — Z79899 Other long term (current) drug therapy: Secondary | ICD-10-CM

## 2015-09-20 DIAGNOSIS — Z418 Encounter for other procedures for purposes other than remedying health state: Secondary | ICD-10-CM

## 2015-09-20 MED ORDER — DEXAMETHASONE 4 MG PO TABS: 8.0000 mg | ORAL_TABLET | Freq: Every day | ORAL | Status: DC

## 2015-09-20 NOTE — Progress Notes (Signed)
[facility] day:  09/20/2015   Chief Complaint: Cassidy Bradley is an 54 y.o. female with metastatic uterine leiomyosarcoma who is seen for assessment prior to cycle #6 adriamycin.  HPI: The patient was last seen in the medical oncology clinic on 08/30/2015.  At that time, she received cycle #5 Adriamycin with Neulasta support.  She noted some constipation.  She started MiraLAX yesterday. She is also drinking prune juice as well as a mixture of apple and pineapple juice. She had a little bit of a blood on her stool which she thought was due to hemorrhoids.   During the interim, she has done well.  She is taking Prevacid for her reflux.  She denies any pain.  She has chronic mild left lower extremity swelling secondary to her history of DVT.  She remains on Xarelto.  She denies any bruising or bleeding.   Past Medical History  Diagnosis Date  . Breast lump 2006     both breast   . Chest wall mass   . Cancer   . Uterine cancer     Past Surgical History  Procedure Laterality Date  . Cholecystectomy  2001  . Ankle surgery  2005  . Cesarean section  1993  . Back surgery  2005  . Breast biopsy Bilateral   . Colonoscopy    . Abdominal hysterectomy  01/30/15   Family History  Problem Relation Age of Onset  . Breast cancer Sister 42  . Breast cancer Maternal Aunt   . Breast cancer Maternal Grandmother 60  . Stomach cancer Maternal Aunt   . Lung cancer Maternal Grandfather   . Colon polyps Mother   . Cancer - Colon Cousin 73    first cousin  . Lupus Father   . Esophageal cancer Maternal Uncle     Social History:  reports that she has never smoked. She has never used smokeless tobacco. She reports that she does not drink alcohol or use illicit drugs.  The patient is alone today.  Allergies:  Allergies  Allergen Reactions  . Pravastatin Hives and Nausea And Vomiting  . Sulfa Antibiotics Hives and Swelling  . Latex Rash  . Other Rash  and Other (See Comments)    Pt states that she is allergic to spandex.   Current Medications: Current Outpatient Prescriptions  Medication Sig Dispense Refill  . dexamethasone (DECADRON) 4 MG tablet Take 2 tablets (8 mg total) by mouth daily. beginning the day after chemotherapy for 3 days (Patient taking differently: Take 8 mg by mouth daily. Pt begins using this the day after chemotherapy for 3 days.) 20 tablet 0  . lansoprazole (PREVACID) 30 MG capsule Take 30 mg by mouth as needed.    . lidocaine-prilocaine (EMLA) cream Apply 1 application topically as needed. Apply one hour prior to chemotherapy (Patient taking differently: Apply 1 application topically as needed (prior to accessing pts port). Pt applies one hour prior to chemotherapy.) 30 g 1  . loratadine (CLARITIN) 10 MG tablet Take 10 mg by mouth daily.    . magic mouthwash SOLN Take 5 mLs by mouth 3 (three) times daily as needed for mouth pain. Pt swishes and spits out medication. 240 mL 3  . ondansetron (ZOFRAN) 8 MG tablet Take 8 mg by mouth every 8 (eight) hours as needed for nausea or vomiting.     . rivaroxaban (XARELTO) 20 MG TABS tablet Take 1 tablet (20 mg total) by mouth daily with  supper. 90 tablet 0  . sucralfate (CARAFATE) 1 G tablet Take 1 tablet (1 g total) by mouth 4 (four) times daily. 120 tablet 1  . XARELTO 20 MG TABS tablet ON DAY 22 SWITCH TO ONE $Remo'20MG'zOnFE$  TABLET ONCE A DAY WITH FOOD  0  . nystatin (MYCOSTATIN) 100000 UNIT/ML suspension Take 5 mLs (500,000 Units total) by mouth 4 (four) times daily. (Patient not taking: Reported on 08/30/2015) 100 mL 0  . oxyCODONE-acetaminophen (PERCOCET/ROXICET) 5-325 MG per tablet Take 1 tablet by mouth every 6 (six) hours as needed for severe pain. (Patient not taking: Reported on 09/20/2015) 30 tablet 0  . ranitidine (ZANTAC) 150 MG capsule Take 1 capsule (150 mg total) by mouth 2 (two) times daily. (Patient not taking: Reported on 08/30/2015) 28 capsule 0   No current  facility-administered medications for this visit.   Facility-Administered Medications Ordered in Other Visits  Medication Dose Route Frequency Provider Last Rate Last Dose  . 0.9 %  sodium chloride infusion   Intravenous Continuous Lequita Asal, MD 125 mL/hr at 06/28/15 1309    . sodium chloride 0.9 % injection 10 mL  10 mL Intracatheter PRN Lequita Asal, MD      . sodium chloride 0.9 % injection 10 mL  10 mL Intracatheter PRN Lequita Asal, MD   10 mL at 06/28/15 1045   Review of Systems:  GENERAL:  Feels good.  No fevers or sweats.  Weight stable. PERFORMANCE STATUS (ECOG):  1-2 HEENT:  No visual changes, runny nose, sore throat, mouth sores or tenderness. Lungs: No shortness of breath or cough.  No hemoptysis. Cardiac:  No chest pain, palpitations, orthopnea, or PND. GI:  Reflux.  Constipation.  No nausea, vomiting, diarrhea, or melena. GU:  No urgency, frequency, dysuria, or hematuria. Musculoskeletal:  No back pain.  No joint pain.  No muscle tenderness. Extremities:  Left lower extremity edema(chronic).  No bone pain. Skin:  No rashes or skin changes. Neuro:  No headache, numbness or weakness, balance or coordination issues. Endocrine:  No diabetes, thyroid issues, hot flashes or night sweats. Psych:  No mood changes, depression or anxiety. Pain:  No focal pain. Review of systems:  All other systems reviewed and found to be negative.                        Physical Exam: Blood pressure 116/83, pulse 101, temperature 97.7 F (36.5 C), temperature source Tympanic, resp. rate 20, height $RemoveBe'5\' 4"'VIuHQosIe$  (1.626 m), weight 169 lb 15.6 oz (77.1 kg), SpO2 100 %.  GENERAL:  Well developed, well nourished, sitting comfortably in the exam room in no acute distress. MENTAL STATUS:  Alert and oriented to person, place and time. HEAD:  Wearing a dark gray wrap.  Alopecia totalis.  Normocephalic, atraumatic, face symmetric, no Cushingoid features. EYES:  Brown eyes.  Pupils equal round  and reactive to light and accomodation.  No conjunctivitis or scleral icterus. ENT:  Oropharynx clear without lesion.  Tongue normal. Mucous membranes moist.  RESPIRATORY:  Clear to auscultation without rales, wheezes or rhonchi. CARDIOVASCULAR:  Regular rate and rhythm without murmur, rub or gallop. ABDOMEN:  Soft, non-tender, with active bowel sounds, and no hepatosplenomegaly.  No masses. SKIN:  No rashes, ulcers or lesions. EXTREMITIES:  Mild left lower extremity edema.  No skin discoloration.  No erythema. LYMPH NODES: No palpable cervical, supraclavicular, axillary or inguinal adenopathy  NEUROLOGICAL: Unremarkable. PSYCH:  Appropriate.  Laboratory testing: [facility] labs on  09/19/2015 included a hematocrit 35.8, hemoglobin 11.7, platelets 303,000, white count 1900 with an ANC of 700. Comprehensive metabolic panel included a creatinine of 0.63. Liver function tests were normal.  No visits with results within 3 Day(s) from this visit. Latest known visit with results is:  Appointment on 08/07/2015  Component Date Value Ref Range Status  . Creatinine, Ser 08/07/2015 0.60  0.44 - 1.00 mg/dL Final  . GFR calc non Af Amer 08/07/2015 >60  >60 mL/min Final  . GFR calc Af Amer 08/07/2015 >60  >60 mL/min Final   Comment: (NOTE) The eGFR has been calculated using the CKD EPI equation. This calculation has not been validated in all clinical situations. eGFR's persistently <60 mL/min signify possible Chronic Kidney Disease.    Assessment:  MARVEEN DONLON is a 54 y.o. female 54 y.o. female with stage IV uterine leiomyosarcoma. She underwent TAH/BSO on 01/30/2015. Pathology confirmed high grade leiomyosarcoma with 2 large adjacent tumor nodules (10 cm and 5 cm) and a separate 0.5 cm serosal nodule.   Chest, abdomen, and pelvic CT scan on 02/12/2015 revealed a 1.7 x 2.3 cm right external iliac node. Pulmonary nodules were scattered throughout the lungs bilaterally. There was left internal  mammary chain adenopathy measuring up to 3.6 x 2 cm.  She underwent anterior chest wall pleural-based CT guided biopsy on 02/21/2015. Pathology was consistent with metastatic leiomyosarcoma. Bone scan on 02/28/2015 revealed no evidence of metastatic disease with indeterminate uptake in the left posterior rib.   She has a fraternal twin who developed breast cancer at age 30. Several other family members have malignancies. 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  Prechemotherapy labs from [facility] revealed leukopenia (WBC 2300 with ANC 900). Work-up to date is negative. She has a family history of leukopenia. Bone marrow on 03/08/2015 revealed no evidence of neoplasia or metastatic disease. Marrow was normocellular for age (30-50%) with trilineage hematopoiesis. There was no increase in marrow reticulin fibers. Flow cytometry and cytogenetics were normal.  She received 4 cycles of gemcitabine and Taxotere (03/20/2015 - 05/22/2015) with Neulasta support. She received gemcitabine alone on 05/22/2015.  She was admitted on 05/24/2015 with an acute febrile illness. All cultures were negative.   Chest CT on 05/25/2015 revealed early progressive disease. Abdominal and pelvic CT scan on 05/30/2015 revealed stable to slightly increased right external iliac lymph node (1.8 x 2.5 cm). Echocardiogram on 06/18/2015 revealed ejection fraction of 60-65%.  Bilateral lower extremity duplex on 05/30/2015 was negative.  Left upper extremity duplex on 06/08/2015 was negative.  Left lower extremity duplex on 07/19/2015 documented a DVT.  She is on Xarelto.   She is status post 5 cycles of adriamycin (06/07/2015 - 08/30/2015) with Neulasta (On-Pro) support.  Cycle #3 was complicated by an acute  left lower extremity DVT.  Chest, abdomen, and pelvic CT scan on 08/07/2015 revealed a partial treatment response with decrease anterior left  upper pleural metastasis, stable to decreased pulmonary metastases and decreased right external iliac adenopathy. There were no new sites of disease.  Symptomatically, she feels good.  Reflux is managed with Prevacid.  Exam is stable.  She is neutropenic ([place] 700).  Plan: 1.  Review [facility] labs. 2.  No chemotherapy today. 3.  Discuss neutropenic precautions. 4.  Refill:  Decadron for post chemotherapy delayed nausea and vomiting. 5.  Complete FMLA papers for husband. 4.  Continue Xarelto 20 mg a day. 5.  Complete slips for [facility] labs. 6.  Discuss plan for repeat imaging and echo after 6 cycles. 7.  RTC in 1 week for MD assessment, review of [facility] labs, and cycle #6 adriamycin.   Lequita Asal, MD 09/20/2015, 10:00 AM

## 2015-09-20 NOTE — Progress Notes (Signed)
Patient here for follow up Uterine leiomyosarcoma. States she is feeling well. States left leg pain has decreased but "still feels funny at times". Denies pain, nausea or shortness of breath.

## 2015-09-26 ENCOUNTER — Encounter: Payer: Self-pay | Admitting: Hematology and Oncology

## 2015-09-27 ENCOUNTER — Inpatient Hospital Stay: Payer: 59

## 2015-09-27 ENCOUNTER — Inpatient Hospital Stay: Payer: 59 | Admitting: Hematology and Oncology

## 2015-10-01 ENCOUNTER — Telehealth: Payer: Self-pay

## 2015-10-01 NOTE — Telephone Encounter (Signed)
Called pt and let her know her lab slips are filled and ready for her to pick up.  Will leave downstairs at desk for pt to pick up

## 2015-10-03 ENCOUNTER — Encounter: Payer: Self-pay | Admitting: Hematology and Oncology

## 2015-10-04 ENCOUNTER — Encounter: Payer: Self-pay | Admitting: Hematology and Oncology

## 2015-10-04 ENCOUNTER — Inpatient Hospital Stay (HOSPITAL_BASED_OUTPATIENT_CLINIC_OR_DEPARTMENT_OTHER): Payer: 59 | Admitting: Hematology and Oncology

## 2015-10-04 ENCOUNTER — Inpatient Hospital Stay: Payer: 59 | Attending: Hematology and Oncology

## 2015-10-04 ENCOUNTER — Inpatient Hospital Stay: Payer: 59

## 2015-10-04 VITALS — BP 112/74 | HR 68 | Temp 96.0°F | Wt 168.7 lb

## 2015-10-04 DIAGNOSIS — C801 Malignant (primary) neoplasm, unspecified: Secondary | ICD-10-CM

## 2015-10-04 DIAGNOSIS — C55 Malignant neoplasm of uterus, part unspecified: Secondary | ICD-10-CM

## 2015-10-04 DIAGNOSIS — Z5111 Encounter for antineoplastic chemotherapy: Secondary | ICD-10-CM | POA: Diagnosis not present

## 2015-10-04 DIAGNOSIS — Z23 Encounter for immunization: Secondary | ICD-10-CM | POA: Insufficient documentation

## 2015-10-04 DIAGNOSIS — C782 Secondary malignant neoplasm of pleura: Secondary | ICD-10-CM | POA: Insufficient documentation

## 2015-10-04 DIAGNOSIS — C7800 Secondary malignant neoplasm of unspecified lung: Secondary | ICD-10-CM

## 2015-10-04 DIAGNOSIS — D72819 Decreased white blood cell count, unspecified: Secondary | ICD-10-CM | POA: Insufficient documentation

## 2015-10-04 DIAGNOSIS — Z418 Encounter for other procedures for purposes other than remedying health state: Secondary | ICD-10-CM | POA: Insufficient documentation

## 2015-10-04 DIAGNOSIS — Z79899 Other long term (current) drug therapy: Secondary | ICD-10-CM | POA: Insufficient documentation

## 2015-10-04 DIAGNOSIS — C7801 Secondary malignant neoplasm of right lung: Secondary | ICD-10-CM | POA: Insufficient documentation

## 2015-10-04 DIAGNOSIS — C7802 Secondary malignant neoplasm of left lung: Secondary | ICD-10-CM | POA: Insufficient documentation

## 2015-10-04 MED ORDER — DOXORUBICIN HCL CHEMO IV INJECTION 2 MG/ML
60.0000 mg/m2 | Freq: Once | INTRAVENOUS | Status: AC
  Administered 2015-10-04: 116 mg via INTRAVENOUS
  Filled 2015-10-04: qty 58

## 2015-10-04 MED ORDER — INFLUENZA VAC SPLIT QUAD 0.5 ML IM SUSY
0.5000 mL | PREFILLED_SYRINGE | Freq: Once | INTRAMUSCULAR | Status: AC
  Administered 2015-10-04: 0.5 mL via INTRAMUSCULAR

## 2015-10-04 MED ORDER — SODIUM CHLORIDE 0.9 % IV SOLN
INTRAVENOUS | Status: DC
  Administered 2015-10-04: 12:00:00 via INTRAVENOUS
  Filled 2015-10-04: qty 1000

## 2015-10-04 MED ORDER — SODIUM CHLORIDE 0.9 % IV SOLN
Freq: Once | INTRAVENOUS | Status: AC
  Administered 2015-10-04: 12:00:00 via INTRAVENOUS
  Filled 2015-10-04: qty 5

## 2015-10-04 MED ORDER — PALONOSETRON HCL INJECTION 0.25 MG/5ML
0.2500 mg | Freq: Once | INTRAVENOUS | Status: AC
  Administered 2015-10-04: 0.25 mg via INTRAVENOUS
  Filled 2015-10-04: qty 5

## 2015-10-04 MED ORDER — PEGFILGRASTIM 6 MG/0.6ML ~~LOC~~ PSKT
6.0000 mg | PREFILLED_SYRINGE | Freq: Once | SUBCUTANEOUS | Status: AC
  Administered 2015-10-04: 6 mg via SUBCUTANEOUS
  Filled 2015-10-04: qty 0.6

## 2015-10-04 MED ORDER — HEPARIN SOD (PORK) LOCK FLUSH 100 UNIT/ML IV SOLN
500.0000 [IU] | Freq: Once | INTRAVENOUS | Status: AC | PRN
  Filled 2015-10-04: qty 5

## 2015-10-04 NOTE — Progress Notes (Signed)
[place] [facility] [facility] day:  10/04/2015   Chief Complaint: Cassidy Bradley is an 54 y.o. female with metastatic uterine leiomyosarcoma who is seen for assessment prior to cycle #6 adriamycin.  HPI: The patient was last seen in the medical oncology clinic on 09/20/2015.  At that time, she was to have received cycle #6 adriamycin.  Chemotherapy was postponed secondary to neutropenia (ANC 700).  During the interim, she has done well.  She notes that she is eating lots of vegetables. She denies any constipation.  She has had no fevers.   Past Medical History  Diagnosis Date  . Breast lump 2006     both breast   . Chest wall mass   . Cancer ([place])   . Uterine cancer [facility])     Past Surgical History  Procedure Laterality Date  . Cholecystectomy  2001  . Ankle surgery  2005  . Cesarean section  1993  . Back surgery  2005  . Breast biopsy Bilateral   . Colonoscopy    . Abdominal hysterectomy  01/30/15   Family History  Problem Relation Age of Onset  . Breast cancer Sister 35  . Breast cancer Maternal Aunt   . Breast cancer Maternal Grandmother 60  . Stomach cancer Maternal Aunt   . Lung cancer Maternal Grandfather   . Colon polyps Mother   . Cancer - Colon Cousin 12    first cousin  . Lupus Father   . Esophageal cancer Maternal Uncle     Social History:  reports that she has never smoked. She has never used smokeless tobacco. She reports that she does not drink alcohol or use illicit drugs.  The patient is alone today.  Allergies:  Allergies  Allergen Reactions  . Pravastatin Hives and Nausea And Vomiting  . Sulfa Antibiotics Hives and Swelling  . Latex Rash  . Other Rash and Other (See Comments)    Pt states that she is allergic to spandex.   Current Medications: Current Outpatient Prescriptions  Medication Sig Dispense Refill  . dexamethasone (DECADRON) 4 MG tablet Take 2 tablets (8 mg total) by mouth daily. beginning the day after  chemotherapy for 3 days 18 tablet 0  . lansoprazole (PREVACID) 30 MG capsule Take 30 mg by mouth as needed.    . lidocaine-prilocaine (EMLA) cream Apply 1 application topically as needed. Apply one hour prior to chemotherapy (Patient taking differently: Apply 1 application topically as needed (prior to accessing pts port). Pt applies one hour prior to chemotherapy.) 30 g 1  . loratadine (CLARITIN) 10 MG tablet Take 10 mg by mouth daily.    . magic mouthwash SOLN Take 5 mLs by mouth 3 (three) times daily as needed for mouth pain. Pt swishes and spits out medication. 240 mL 3  . nystatin (MYCOSTATIN) 100000 UNIT/ML suspension Take 5 mLs (500,000 Units total) by mouth 4 (four) times daily. 100 mL 0  . ondansetron (ZOFRAN) 8 MG tablet Take 8 mg by mouth every 8 (eight) hours as needed for nausea or vomiting.     . rivaroxaban (XARELTO) 20 MG TABS tablet Take 1 tablet (20 mg total) by mouth daily with supper. 90 tablet 0  . sucralfate (CARAFATE) 1 G tablet Take 1 tablet (1 g total) by mouth 4 (four) times daily. 120 tablet 1  . XARELTO 20 MG TABS tablet ON DAY 22 SWITCH TO ONE $Remo'20MG'YBghF$  TABLET ONCE A DAY WITH FOOD  0   Current Facility-Administered  Medications  Medication Dose Route Frequency Provider Last Rate Last Dose  . Influenza vac split quadrivalent PF (FLUARIX) injection 0.5 mL  0.5 mL Intramuscular Once Lequita Asal, MD       Facility-Administered Medications Ordered in Other Visits  Medication Dose Route Frequency Provider Last Rate Last Dose  . 0.9 %  sodium chloride infusion   Intravenous Continuous Lequita Asal, MD 125 mL/hr at 06/28/15 1309    . sodium chloride 0.9 % injection 10 mL  10 mL Intracatheter PRN Lequita Asal, MD      . sodium chloride 0.9 % injection 10 mL  10 mL Intracatheter PRN Lequita Asal, MD   10 mL at 06/28/15 1045   Review of Systems:  GENERAL:  Feels good.  No fevers or sweats.  Weight stable. PERFORMANCE STATUS (ECOG):  1-2 HEENT:  No visual  changes, runny nose, sore throat, mouth sores or tenderness. Lungs: No shortness of breath or cough.  No hemoptysis. Cardiac:  No chest pain, palpitations, orthopnea, or PND. GI:  Reflux.  No nausea, vomiting, diarrhea, constipation or melena. GU:  No urgency, frequency, dysuria, or hematuria. Musculoskeletal:  No back pain.  No joint pain.  No muscle tenderness. Extremities:  Left lower extremity edema (chronic).  No bone pain. Skin:  No rashes or skin changes. Neuro:  No headache, numbness or weakness, balance or coordination issues. Endocrine:  No diabetes, thyroid issues, hot flashes or night sweats. Psych:  No mood changes, depression or anxiety. Pain:  No focal pain. Review of systems:  All other systems reviewed and found to be negative.                        Physical Exam: Blood pressure 112/74, pulse 68, temperature 96 F (35.6 C), temperature source Tympanic, weight 168 lb 10.4 oz (76.5 kg).  GENERAL:  Well developed, well nourished, sitting comfortably in the exam room in no acute distress. MENTAL STATUS:  Alert and oriented to person, place and time. HEAD:  Wearing a black wrap.  Alopecia totalis.  Normocephalic, atraumatic, face symmetric, no Cushingoid features. EYES:  Black rimmed glasses.  Brown eyes.  Pupils equal round and reactive to light and accomodation.  No conjunctivitis or scleral icterus. ENT:  Oropharynx clear without lesion.  Tongue normal. Mucous membranes moist.  RESPIRATORY:  Clear to auscultation without rales, wheezes or rhonchi. CARDIOVASCULAR:  Regular rate and rhythm without murmur, rub or gallop. ABDOMEN:  Soft, non-tender, with active bowel sounds, and no hepatosplenomegaly.  No masses. SKIN:  No rashes, ulcers or lesions. EXTREMITIES:  Mild left lower extremity edema.  No skin discoloration.  No erythema. LYMPH NODES: No palpable cervical, supraclavicular, axillary or inguinal adenopathy  NEUROLOGICAL: Unremarkable. PSYCH:   Appropriate.  Laboratory testing: [facility] labs on 09/19/2015 included a hematocrit 35.8, hemoglobin 11.7, platelets 303,000, white count 1900 with an [place] of 700. Comprehensive metabolic panel included a creatinine of 0.63. Liver function tests were normal.  [facility] labs on 10/03/2015 included a hematocrit 36.0, hemoglobin 11.3, platelets 303,000, white count 2800 with an ANC of 1200. Comprehensive metabolic panel included a creatinine of 0.67 Liver function tests were normal.  Magnesium was 1.9.  No visits with results within 3 Day(s) from this visit. Latest known visit with results is:  Appointment on 08/07/2015  Component Date Value Ref Range Status  . Creatinine, Ser 08/07/2015 0.60  0.44 - 1.00 mg/dL Final  . GFR calc non Af Amer 08/07/2015 >60  >  60 mL/min Final  . GFR calc Af Amer 08/07/2015 >60  >60 mL/min Final   Comment: (NOTE) The eGFR has been calculated using the CKD EPI equation. This calculation has not been validated in all clinical situations. eGFR's persistently <60 mL/min signify possible Chronic Kidney Disease.    Assessment:  LETZY GULLICKSON is a 54 y.o. female 54 y.o. female with stage IV uterine leiomyosarcoma. She underwent TAH/BSO on 01/30/2015. Pathology confirmed high grade leiomyosarcoma with 2 large adjacent tumor nodules (10 cm and 5 cm) and a separate 0.5 cm serosal nodule.   Chest, abdomen, and pelvic CT scan on 02/12/2015 revealed a 1.7 x 2.3 cm right external iliac node. Pulmonary nodules were scattered throughout the lungs bilaterally. There was left internal mammary chain adenopathy measuring up to 3.6 x 2 cm.  She underwent anterior chest wall pleural-based CT guided biopsy on 02/21/2015. Pathology was consistent with metastatic leiomyosarcoma. Bone scan on 02/28/2015 revealed no evidence of metastatic disease with indeterminate uptake in the left posterior rib.   She has a fraternal twin who developed breast cancer at age 6. Several other family  members have malignancies. 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  Prechemotherapy labs from [facility] revealed leukopenia (WBC 2300 with ANC 900). Work-up to date is negative. She has a family history of leukopenia. Bone marrow on 03/08/2015 revealed no evidence of neoplasia or metastatic disease. Marrow was normocellular for age (30-50%) with trilineage hematopoiesis. There was no increase in marrow reticulin fibers. Flow cytometry and cytogenetics were normal.  She received 4 cycles of gemcitabine and Taxotere (03/20/2015 - 05/22/2015) with Neulasta support. She received gemcitabine alone on 05/22/2015.  She was admitted on 05/24/2015 with an acute febrile illness. All cultures were negative.   Chest CT on 05/25/2015 revealed early progressive disease. Abdominal and pelvic CT scan on 05/30/2015 revealed stable to slightly increased right external iliac lymph node (1.8 x 2.5 cm). Echocardiogram on 06/18/2015 revealed ejection fraction of 60-65%.  Bilateral lower extremity duplex on 05/30/2015 was negative.  Left upper extremity duplex on 06/08/2015 was negative.  Left lower extremity duplex on 07/19/2015 documented a DVT.  She is on Xarelto.   She is status post 5 cycles of adriamycin (06/07/2015 - 08/30/2015) with Neulasta (On-Pro) support.  Cycle #3 was complicated by an acute  left lower extremity DVT.  Cycle #6 was postponed secondary to neutropenia ([place] 700).  Chest, abdomen, and pelvic CT scan on 08/07/2015 revealed a partial treatment response with decrease anterior left upper pleural metastasis, stable to decreased pulmonary metastases and decreased right external iliac adenopathy. There were no new sites of disease.  Symptomatically, she feels good.  Exam is stable.  Plan: 1.  Review [facility] labs. 2.  Cycle #6 adriamycin. 3.  OnPro Neulasta. 4.  Continue Xarelto 20 mg a day. 5.  Schedule  echocardiogram and CT scans (chest, abdomen, pelvis) in 2 1/2 weeks. 6.  Complete slips for [facility] labs. 7.  RTC in 3 weeks for MD assessment, review of [facility] labs, review of CT scans, and +/- cycle #7 adriamycin.   Lequita Asal, MD 10/04/2015, 10:16 AM

## 2015-10-05 ENCOUNTER — Telehealth: Payer: Self-pay

## 2015-10-05 NOTE — Telephone Encounter (Signed)
Called and spoke with pt to clarify her to take  Dexamethasone Take 2 tablets (8 mg total) by mouth daily. beginning the day after chemotherapy for 3 days. Pt verbalized an understanding she was questioning because her bottles were worded different.

## 2015-10-19 ENCOUNTER — Other Ambulatory Visit: Payer: Self-pay | Admitting: Hematology and Oncology

## 2015-10-22 ENCOUNTER — Ambulatory Visit
Admission: RE | Admit: 2015-10-22 | Discharge: 2015-10-22 | Disposition: A | Discharge status: Home / Self Care | Payer: 59 | Source: Ambulatory Visit | Attending: Hematology and Oncology | Admitting: Hematology and Oncology

## 2015-10-22 DIAGNOSIS — C55 Malignant neoplasm of uterus, part unspecified: Secondary | ICD-10-CM

## 2015-10-22 DIAGNOSIS — C7802 Secondary malignant neoplasm of left lung: Secondary | ICD-10-CM | POA: Insufficient documentation

## 2015-10-22 DIAGNOSIS — C7801 Secondary malignant neoplasm of right lung: Secondary | ICD-10-CM | POA: Insufficient documentation

## 2015-10-22 DIAGNOSIS — R590 Localized enlarged lymph nodes: Secondary | ICD-10-CM | POA: Insufficient documentation

## 2015-10-22 DIAGNOSIS — Z09 Encounter for follow-up examination after completed treatment for conditions other than malignant neoplasm: Secondary | ICD-10-CM | POA: Insufficient documentation

## 2015-10-22 LAB — POCT I-STAT CREATININE: Creatinine, Ser: 0.6 mg/dL (ref 0.44–1.00)

## 2015-10-22 MED ORDER — IOHEXOL 300 MG/ML  SOLN
100.0000 mL | Freq: Once | INTRAMUSCULAR | Status: AC | PRN
  Administered 2015-10-22: 100 mL via INTRAVENOUS

## 2015-10-22 NOTE — Progress Notes (Signed)
*  PRELIMINARY RESULTS* Echocardiogram 2D Echocardiogram has been performed.  [name] 10/22/2015, 10:34 AM

## 2015-10-24 ENCOUNTER — Encounter: Payer: Self-pay | Admitting: Hematology and Oncology

## 2015-10-25 ENCOUNTER — Inpatient Hospital Stay: Payer: 59

## 2015-10-25 ENCOUNTER — Inpatient Hospital Stay (HOSPITAL_BASED_OUTPATIENT_CLINIC_OR_DEPARTMENT_OTHER): Payer: 59 | Admitting: Hematology and Oncology

## 2015-10-25 ENCOUNTER — Other Ambulatory Visit: Payer: Self-pay

## 2015-10-25 ENCOUNTER — Other Ambulatory Visit: Payer: Self-pay | Admitting: Hematology and Oncology

## 2015-10-25 VITALS — BP 128/84 | HR 93 | Temp 97.4°F | Resp 18 | Ht 64.0 in | Wt 164.9 lb

## 2015-10-25 DIAGNOSIS — C55 Malignant neoplasm of uterus, part unspecified: Secondary | ICD-10-CM

## 2015-10-25 DIAGNOSIS — C7801 Secondary malignant neoplasm of right lung: Secondary | ICD-10-CM | POA: Diagnosis not present

## 2015-10-25 DIAGNOSIS — C7802 Secondary malignant neoplasm of left lung: Secondary | ICD-10-CM | POA: Diagnosis not present

## 2015-10-25 DIAGNOSIS — I82432 Acute embolism and thrombosis of left popliteal vein: Secondary | ICD-10-CM

## 2015-10-25 DIAGNOSIS — Z79899 Other long term (current) drug therapy: Secondary | ICD-10-CM

## 2015-10-25 DIAGNOSIS — C782 Secondary malignant neoplasm of pleura: Secondary | ICD-10-CM | POA: Diagnosis not present

## 2015-10-25 DIAGNOSIS — D72819 Decreased white blood cell count, unspecified: Secondary | ICD-10-CM

## 2015-10-25 MED ORDER — RIVAROXABAN 20 MG PO TABS: 20.0000 mg | ORAL_TABLET | Freq: Every day | ORAL | Status: DC

## 2015-10-25 NOTE — Progress Notes (Signed)
Patient is here for follow-up and chemotherapy treatment. Patient states that she has been doing well and has no complaints today. She had CT and Echo on Monday, October 24th.

## 2015-10-25 NOTE — Progress Notes (Signed)
[place] [facility] day:  10/25/2015   Chief Complaint: Cassidy Bradley is an 54 y.o. female with metastatic uterine leiomyosarcoma who is seen for review of interval studies  and assessment prior to cycle #7 adriamycin.  HPI: The patient was last seen in the medical oncology clinic on 10/04/2015.  At that time, she was doing well.  Counts had recovered (ANC 700 to 1200) from the week prior.  She received cycle #6 Adriamycin with Neulasta support.  Per prior plan, she underwent restaging studies.  Chest, abdomen, and pelvic CT scan on 10/22/2015 revealed mild decrease in left anterior chest wall mass/internal mammary lymphadenopathy (3.1 x 4.6 cm to 2.5 x 4.4 cm).  There was decreased mild right external iliac lymphadenopathy (1.4 cm to 1.2 cm).  There was diffuse bilateral pulmonary metastases, without significant change (largest 1.1 cm in anterior right lung).  There was no new or progressive metastatic disease.  Echocardiogram on 10/22/2015 revealed an ejection fraction of 65%.  During the interim, she has done well.  She notes a lot of gas post CT scan.  She denies any other complaint.   Past Medical History  Diagnosis Date  . Breast lump 2006     both breast   . Chest wall mass   . Cancer ([place])   . Uterine cancer [facility])     Past Surgical History  Procedure Laterality Date  . Cholecystectomy  2001  . Ankle surgery  2005  . Cesarean section  1993  . Back surgery  2005  . Breast biopsy Bilateral   . Colonoscopy    . Abdominal hysterectomy  01/30/15   Family History  Problem Relation Age of Onset  . Breast cancer Sister 41  . Breast cancer Maternal Aunt   . Breast cancer Maternal Grandmother 60  . Stomach cancer Maternal Aunt   . Lung cancer Maternal Grandfather   . Colon polyps Mother   . Cancer - Colon Cousin 33    first cousin  . Lupus Father   . Esophageal cancer Maternal Uncle     Social History:  reports that she has never  smoked. She has never used smokeless tobacco. She reports that she does not drink alcohol or use illicit drugs.   Allergies:  Allergies  Allergen Reactions  . Pravastatin Hives and Nausea And Vomiting  . Sulfa Antibiotics Hives and Swelling  . Latex Rash  . Other Rash and Other (See Comments)    Pt states that she is allergic to spandex.   Current Medications: Current Outpatient Prescriptions  Medication Sig Dispense Refill  . dexamethasone (DECADRON) 4 MG tablet Take 2 tablets (8 mg total) by mouth daily. beginning the day after chemotherapy for 3 days 18 tablet 0  . lansoprazole (PREVACID) 30 MG capsule Take 30 mg by mouth as needed.    . lidocaine-prilocaine (EMLA) cream Apply 1 application topically as needed. Apply one hour prior to chemotherapy (Patient taking differently: Apply 1 application topically as needed (prior to accessing pts port). Pt applies one hour prior to chemotherapy.) 30 g 1  . loratadine (CLARITIN) 10 MG tablet Take 10 mg by mouth daily.    . magic mouthwash SOLN Take 5 mLs by mouth 3 (three) times daily as needed for mouth pain. Pt swishes and spits out medication. 240 mL 3  . nystatin (MYCOSTATIN) 100000 UNIT/ML suspension Take 5 mLs (500,000 Units total) by mouth 4 (four) times daily. 100 mL 0  . ondansetron (  ZOFRAN) 8 MG tablet Take 8 mg by mouth every 8 (eight) hours as needed for nausea or vomiting.     . sucralfate (CARAFATE) 1 G tablet Take 1 tablet (1 g total) by mouth 4 (four) times daily. 120 tablet 1  . rivaroxaban (XARELTO) 20 MG TABS tablet Take 1 tablet (20 mg total) by mouth daily with supper. 90 tablet 1   No current facility-administered medications for this visit.   Facility-Administered Medications Ordered in Other Visits  Medication Dose Route Frequency Provider Last Rate Last Dose  . 0.9 %  sodium chloride infusion   Intravenous Continuous Lequita Asal, MD 125 mL/hr at 06/28/15 1309    . sodium chloride 0.9 % injection 10 mL  10 mL  Intracatheter PRN Lequita Asal, MD      . sodium chloride 0.9 % injection 10 mL  10 mL Intracatheter PRN Lequita Asal, MD   10 mL at 06/28/15 1045   Review of Systems:  GENERAL:  Feels good.  No fevers or sweats.  Weight stable. PERFORMANCE STATUS (ECOG):  1-2 HEENT:  No visual changes, runny nose, sore throat, mouth sores or tenderness. Lungs: No shortness of breath or cough.  No hemoptysis. Cardiac:  No chest pain, palpitations, orthopnea, or PND. GI:  Reflux. No nausea, vomiting, diarrhea, constipation, melena, or hematochezia. GU:  No urgency, frequency, dysuria, or hematuria. Musculoskeletal:  No back pain.  No joint pain.  No muscle tenderness. Extremities:  Left lower extremity edema (chronic).  No bone pain. Skin:  No rashes or skin changes. Neuro:  No headache, numbness or weakness, balance or coordination issues. Endocrine:  No diabetes, thyroid issues, hot flashes or night sweats. Psych:  No mood changes, depression or anxiety. Pain:  No focal pain. Review of systems:  All other systems reviewed and found to be negative.                        Physical Exam: Blood pressure 128/84, pulse 93, temperature 97.4 F (36.3 C), temperature source Tympanic, resp. rate 18, height $RemoveBe'5\' 4"'DJYMrvHBT$  (1.626 m), weight 164 lb 14.5 oz (74.8 kg).  GENERAL:  Well developed, well nourished, sitting comfortably in the exam room in no acute distress. MENTAL STATUS:  Alert and oriented to person, place and time. HEAD:  Wearing black wrap.  Alopecia totalis.  Normocephalic, atraumatic, face symmetric, no Cushingoid features. EYES:  Glasses.  Brown eyes.  Pupils equal round and reactive to light and accomodation.  No conjunctivitis or scleral icterus. ENT:  Oropharynx clear without lesion.  Tongue normal. Mucous membranes moist.  RESPIRATORY:  Clear to auscultation without rales, wheezes or rhonchi. CARDIOVASCULAR:  Regular rate and rhythm without murmur, rub or gallop. ABDOMEN:  Soft, non-tender,  with active bowel sounds, and no hepatosplenomegaly.  No masses. SKIN:  No rashes, ulcers or lesions. EXTREMITIES:  Mild left lower extremity edema (stable).  No skin discoloration.  No erythema. LYMPH NODES: No palpable cervical, supraclavicular, axillary or inguinal adenopathy  NEUROLOGICAL: Unremarkable. PSYCH:  Appropriate.  Laboratory testing: [facility] labs on 10/24/2015 included a hematocrit 32.9, hemoglobin 10.7, platelets 348,000, white count 2400 with an ANC of 1200. Comprehensive metabolic panel included a creatinine of 0.69. Liver function tests were normal.  Magnesium was 2.0.  No visits with results within 3 Day(s) from this visit. Latest known visit with results is:  Hospital Outpatient Visit on 10/22/2015  Component Date Value Ref Range Status  . Creatinine, Ser 10/22/2015 0.60  0.44 - 1.00 mg/dL Final   Assessment:  Cassidy Bradley is a 54 y.o. female 54 y.o. female with stage IV uterine leiomyosarcoma. She underwent TAH/BSO on 01/30/2015. Pathology confirmed high grade leiomyosarcoma with 2 large adjacent tumor nodules (10 cm and 5 cm) and a separate 0.5 cm serosal nodule.   Chest, abdomen, and pelvic CT scan on 02/12/2015 revealed a 1.7 x 2.3 cm right external iliac node. Pulmonary nodules were scattered throughout the lungs bilaterally. There was left internal mammary chain adenopathy measuring up to 3.6 x 2 cm.  She underwent anterior chest wall pleural-based CT guided biopsy on 02/21/2015. Pathology was consistent with metastatic leiomyosarcoma. Bone scan on 02/28/2015 revealed no evidence of metastatic disease with indeterminate uptake in the left posterior rib.   She has a fraternal twin who developed breast cancer at age 7. Several other family members have malignancies. 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  Prechemotherapy labs from [facility] revealed leukopenia (WBC 2300 with  ANC 900). Work-up to date is negative. She has a family history of leukopenia. Bone marrow on 03/08/2015 revealed no evidence of neoplasia or metastatic disease. Marrow was normocellular for age (30-50%) with trilineage hematopoiesis. There was no increase in marrow reticulin fibers. Flow cytometry and cytogenetics were normal.  She received 4 cycles of gemcitabine and Taxotere (03/20/2015 - 05/22/2015) with Neulasta support. She received gemcitabine alone on 05/22/2015.  She was admitted on 05/24/2015 with an acute febrile illness. All cultures were negative.   Chest CT on 05/25/2015 revealed early progressive disease. Abdominal and pelvic CT scan on 05/30/2015 revealed stable to slightly increased right external iliac lymph node (1.8 x 2.5 cm).   Echocardiogram on 06/18/2015 revealed ejection fraction of 60-65%.  Echocardiogram on 10/22/2015 revealed an ejection fraction of 65%.  Bilateral lower extremity duplex on 05/30/2015 was negative.  Left upper extremity duplex on 06/08/2015 was negative.  Left lower extremity duplex on 07/19/2015 documented a DVT.  She is on Xarelto.   She is status post 6 cycles of adriamycin (06/07/2015 - 10/04/2015) with Neulasta (On-Pro) support.  Cycle #3 was complicated by an acute  left lower extremity DVT.  Cycle #6 was postponed a week secondary to neutropenia ([place] 700).  Chest, abdomen, and pelvic CT scan on 08/07/2015 revealed a partial treatment response with decrease anterior left upper pleural metastasis, stable to decreased pulmonary metastases and decreased right external iliac adenopathy. There were no new sites of disease.  Chest, abdomen, and pelvic CT scan on 10/22/2015 revealed mild decrease in left anterior chest wall mass/internal mammary lymphadenopathy (3.1 x 4.6 cm to 2.5 x 4.4 cm).  There was decreased mild right external iliac lymphadenopathy (1.4 cm to 1.2 cm).  There was diffuse stable bilateral pulmonary metastases (largest 1.1 cm in  anterior right lung).  There was no new or progressive metastatic disease.  Symptomatically, she feels good.  Exam is stable.  She received the influenza vaccine on 10/04/2015.  Plan: 1.  Review CT scans, echo, and [facility] labs.  Discuss slightly improved scans.  Discuss continuation of adriamycin.  Discuss issues with increasing cumulative adriamycin dose and cardiotoxicity.  Discuss use of Zinecard.  Discuss recent FDA approval of new drug (olaratumab- Lartruva) used in conjunction with doxorubicin in sarcoma with improvement in response rate and overall survival.  Discuss addition of olaratumab to current doxorubicin regimen when clinically available. 2.  Preauth Zinecard (dexrazoxane) at 10:1 ratio dexrazoxane:doxorubicin (dexrazoxane 600 mg/m2). 3.  Rx: Xarelto  20 mg a day. 5.  Complete slips for [facility] labs. 6.  Phone follow-up with Gyn-Onc at [facility]. 7.  RTC in 1 week for MD assessment, review of [facility] labs, and cycle #7 adriamycin and dexrazoxane.   Lequita Asal, MD 10/25/2015, 2:19 PM

## 2015-10-29 ENCOUNTER — Encounter: Payer: Self-pay | Admitting: Hematology and Oncology

## 2015-11-01 ENCOUNTER — Inpatient Hospital Stay: Payer: 59 | Attending: Hematology and Oncology

## 2015-11-01 ENCOUNTER — Inpatient Hospital Stay (HOSPITAL_BASED_OUTPATIENT_CLINIC_OR_DEPARTMENT_OTHER): Payer: 59 | Admitting: Hematology and Oncology

## 2015-11-01 ENCOUNTER — Inpatient Hospital Stay: Payer: 59

## 2015-11-01 ENCOUNTER — Telehealth: Payer: Self-pay | Admitting: Pharmacist

## 2015-11-01 VITALS — BP 127/86 | HR 86 | Temp 95.8°F | Resp 18 | Ht 64.0 in | Wt 164.9 lb

## 2015-11-01 DIAGNOSIS — Z7689 Persons encountering health services in other specified circumstances: Secondary | ICD-10-CM

## 2015-11-01 DIAGNOSIS — C7802 Secondary malignant neoplasm of left lung: Secondary | ICD-10-CM | POA: Diagnosis not present

## 2015-11-01 DIAGNOSIS — C55 Malignant neoplasm of uterus, part unspecified: Secondary | ICD-10-CM | POA: Insufficient documentation

## 2015-11-01 DIAGNOSIS — C782 Secondary malignant neoplasm of pleura: Secondary | ICD-10-CM

## 2015-11-01 DIAGNOSIS — Z79899 Other long term (current) drug therapy: Secondary | ICD-10-CM | POA: Diagnosis not present

## 2015-11-01 DIAGNOSIS — C7801 Secondary malignant neoplasm of right lung: Secondary | ICD-10-CM | POA: Diagnosis not present

## 2015-11-01 DIAGNOSIS — D72819 Decreased white blood cell count, unspecified: Secondary | ICD-10-CM | POA: Insufficient documentation

## 2015-11-01 DIAGNOSIS — Z5111 Encounter for antineoplastic chemotherapy: Secondary | ICD-10-CM | POA: Diagnosis not present

## 2015-11-01 MED ORDER — PEGFILGRASTIM 6 MG/0.6ML ~~LOC~~ PSKT
6.0000 mg | PREFILLED_SYRINGE | Freq: Once | SUBCUTANEOUS | Status: AC
  Administered 2015-11-01: 6 mg via SUBCUTANEOUS
  Filled 2015-11-01: qty 0.6

## 2015-11-01 MED ORDER — HEPARIN SOD (PORK) LOCK FLUSH 100 UNIT/ML IV SOLN
500.0000 [IU] | Freq: Once | INTRAVENOUS | Status: AC | PRN
  Administered 2015-11-01: 500 [IU]
  Filled 2015-11-01: qty 5

## 2015-11-01 MED ORDER — DOXORUBICIN HCL CHEMO IV INJECTION 2 MG/ML
60.0000 mg/m2 | Freq: Once | INTRAVENOUS | Status: AC
  Administered 2015-11-01: 116 mg via INTRAVENOUS
  Filled 2015-11-01: qty 58

## 2015-11-01 MED ORDER — SODIUM CHLORIDE 0.9 % IV SOLN
Freq: Once | INTRAVENOUS | Status: AC
  Administered 2015-11-01: 15:00:00 via INTRAVENOUS
  Filled 2015-11-01: qty 5

## 2015-11-01 MED ORDER — PALONOSETRON HCL INJECTION 0.25 MG/5ML
0.2500 mg | Freq: Once | INTRAVENOUS | Status: AC
  Administered 2015-11-01: 0.25 mg via INTRAVENOUS
  Filled 2015-11-01: qty 5

## 2015-11-01 MED ORDER — SODIUM CHLORIDE 0.9 % IV SOLN
INTRAVENOUS | Status: DC
  Administered 2015-11-01: 15:00:00 via INTRAVENOUS
  Filled 2015-11-01: qty 1000

## 2015-11-01 MED ORDER — SODIUM CHLORIDE 0.9 % IJ SOLN
10.0000 mL | INTRAMUSCULAR | Status: DC | PRN
  Administered 2015-11-01: 10 mL
  Filled 2015-11-01: qty 10

## 2015-11-01 MED ORDER — DEXRAZOXANE INJECTION 500 MG
600.0000 mg/m2 | Freq: Once | INTRAVENOUS | Status: AC
  Administered 2015-11-01: 1160 mg via INTRAVENOUS
  Filled 2015-11-01: qty 91

## 2015-11-01 NOTE — Progress Notes (Signed)
[facility] day:  11/01/2015   Chief Complaint: Cassidy Bradley is an 54 y.o. female with metastatic uterine leiomyosarcoma who is seen for assessment prior to cycle #7 adriamycin with Zinecard.  HPI: The patient was last seen in the medical oncology clinic on 10/25/2015.  At that time, reimaging and follow-up echo were reviewed.  CT scans on 10/22/2015 had revealed mild decrease in left anterior chest wall mass/internal mammary lymphadenopathy (3.1 x 4.6 cm to 2.5 x 4.4 cm).  There was decreased mild right external iliac lymphadenopathy (1.4 cm to 1.2 cm).  There was diffuse bilateral pulmonary metastases, without significant change (largest 1.1 cm in anterior right lung).  There was no new or progressive metastatic disease.  Echo on 10/22/2015 revealed an ejection fraction of 65%.  We discussed issues with continuation of adriamycin.  We discussed the use of Zinecard.  We also discussed the recent FDA approval of olaratumab Joni Fears) used in conjunction with doxorubicin in sarcoma with improvement in response rate and overall survival. We discussed the addition of olaratumab to her current doxorubicin regimen when clinically available.  Decision was made to proceed with the addition of Zinecard with doxorubicin until olaratumub was available.  Preauthorization was obtained.  During the interim, she has done well.  She denies any complaint.   Past Medical History  Diagnosis Date  . Breast lump 2006     both breast   . Chest wall mass   . Cancer ([place])   . Uterine cancer [facility])     Past Surgical History  Procedure Laterality Date  . Cholecystectomy  2001  . Ankle surgery  2005  . Cesarean section  1993  . Back surgery  2005  . Breast biopsy Bilateral   . Colonoscopy    . Abdominal hysterectomy  01/30/15   Family History  Problem Relation Age of Onset  . Breast cancer Sister 64  . Breast cancer Maternal Aunt   . Breast cancer Maternal  Grandmother 60  . Stomach cancer Maternal Aunt   . Lung cancer Maternal Grandfather   . Colon polyps Mother   . Cancer - Colon Cousin 37    first cousin  . Lupus Father   . Esophageal cancer Maternal Uncle     Social History:  reports that she has never smoked. She has never used smokeless tobacco. She reports that she does not drink alcohol or use illicit drugs.   Allergies:  Allergies  Allergen Reactions  . Pravastatin Hives and Nausea And Vomiting  . Sulfa Antibiotics Hives and Swelling  . Latex Rash  . Other Rash and Other (See Comments)    Pt states that she is allergic to spandex.   Current Medications: Current Outpatient Prescriptions  Medication Sig Dispense Refill  . dexamethasone (DECADRON) 4 MG tablet Take 2 tablets (8 mg total) by mouth daily. beginning the day after chemotherapy for 3 days 18 tablet 0  . lansoprazole (PREVACID) 30 MG capsule Take 30 mg by mouth as needed.    . lidocaine-prilocaine (EMLA) cream Apply 1 application topically as needed. Apply one hour prior to chemotherapy (Patient taking differently: Apply 1 application topically as needed (prior to accessing pts port). Pt applies one hour prior to chemotherapy.) 30 g 1  . loratadine (CLARITIN) 10 MG tablet Take 10 mg by mouth daily.    . magic mouthwash SOLN Take 5 mLs by mouth 3 (three) times daily as needed for mouth pain. Pt swishes and  spits out medication. 240 mL 3  . nystatin (MYCOSTATIN) 100000 UNIT/ML suspension Take 5 mLs (500,000 Units total) by mouth 4 (four) times daily. 100 mL 0  . ondansetron (ZOFRAN) 8 MG tablet Take 8 mg by mouth every 8 (eight) hours as needed for nausea or vomiting.     . rivaroxaban (XARELTO) 20 MG TABS tablet Take 1 tablet (20 mg total) by mouth daily with supper. 90 tablet 1  . sucralfate (CARAFATE) 1 G tablet Take 1 tablet (1 g total) by mouth 4 (four) times daily. 120 tablet 1  . amoxicillin-clavulanate (AUGMENTIN) 875-125 MG tablet Take 1 tablet by mouth 2 (two)  times daily. 14 tablet 0   No current facility-administered medications for this visit.   Facility-Administered Medications Ordered in Other Visits  Medication Dose Route Frequency Provider Last Rate Last Dose  . 0.9 %  sodium chloride infusion   Intravenous Continuous Lequita Asal, MD 125 mL/hr at 06/28/15 1309    . sodium chloride 0.9 % injection 10 mL  10 mL Intracatheter PRN Lequita Asal, MD      . sodium chloride 0.9 % injection 10 mL  10 mL Intracatheter PRN Lequita Asal, MD   10 mL at 06/28/15 1045   Review of Systems:  GENERAL:  Feels "ok".  No fevers or sweats.  Weight stable. PERFORMANCE STATUS (ECOG):  1-2 HEENT:  No visual changes, runny nose, sore throat, mouth sores or tenderness. Lungs: No shortness of breath or cough.  No hemoptysis. Cardiac:  No chest pain, palpitations, orthopnea, or PND. GI:  Reflux. No nausea, vomiting, diarrhea, constipation, melena, or hematochezia. GU:  No urgency, frequency, dysuria, or hematuria. Musculoskeletal:  No back pain.  No joint pain.  No muscle tenderness. Extremities:  Left lower extremity edema (chronic).  No bone pain. Skin:  No rashes or skin changes. Neuro:  No headache, numbness or weakness, balance or coordination issues. Endocrine:  No diabetes, thyroid issues, hot flashes or night sweats. Psych:  No mood changes, depression or anxiety. Pain:  No focal pain. Review of systems:  All other systems reviewed and found to be negative.                        Physical Exam: Blood pressure 127/86, pulse 86, temperature 95.8 F (35.4 C), temperature source Tympanic, resp. rate 18, height $RemoveBe'5\' 4"'lbWSUnkaD$  (1.626 m), weight 164 lb 14.5 oz (74.8 kg).  GENERAL:  Well developed, well nourished, sitting comfortably in the exam room in no acute distress. MENTAL STATUS:  Alert and oriented to person, place and time. HEAD:  Alopecia totalis.  Normocephalic, atraumatic, face symmetric, no Cushingoid features. EYES:  Glasses.  Brown  eyes.  Pupils equal round and reactive to light and accomodation.  No conjunctivitis or scleral icterus. ENT:  Oropharynx clear without lesion.  Tongue normal. Mucous membranes moist.  RESPIRATORY:  Clear to auscultation without rales, wheezes or rhonchi. CARDIOVASCULAR:  Regular rate and rhythm without murmur, rub or gallop. ABDOMEN:  Soft, non-tender, with active bowel sounds, and no hepatosplenomegaly.  No masses. SKIN:  No rashes, ulcers or lesions. EXTREMITIES:  Mild left lower extremity edema (stable).  No skin discoloration.  No erythema. LYMPH NODES: No palpable cervical, supraclavicular, axillary or inguinal adenopathy  NEUROLOGICAL: Unremarkable. PSYCH:  Appropriate.  Laboratory testing: [facility] labs on 10/24/2015 included a hematocrit 32.9, hemoglobin 10.7, platelets 348,000, white count 2400 with an ANC of 1200. Comprehensive metabolic panel included a creatinine of 0.69.  Liver function tests were normal.  Magnesium was 2.0.  No visits with results within 3 Day(s) from this visit. Latest known visit with results is:  Hospital Outpatient Visit on 10/22/2015  Component Date Value Ref Range Status  . Creatinine, Ser 10/22/2015 0.60  0.44 - 1.00 mg/dL Final   Assessment:  Cassidy Bradley is a 54 y.o. female 54 y.o. female with stage IV uterine leiomyosarcoma. She underwent TAH/BSO on 01/30/2015. Pathology confirmed high grade leiomyosarcoma with 2 large adjacent tumor nodules (10 cm and 5 cm) and a separate 0.5 cm serosal nodule.   Chest, abdomen, and pelvic CT scan on 02/12/2015 revealed a 1.7 x 2.3 cm right external iliac node. Pulmonary nodules were scattered throughout the lungs bilaterally. There was left internal mammary chain adenopathy measuring up to 3.6 x 2 cm.  She underwent anterior chest wall pleural-based CT guided biopsy on 02/21/2015. Pathology was consistent with metastatic leiomyosarcoma. Bone scan on 02/28/2015 revealed no evidence of metastatic disease with  indeterminate uptake in the left posterior rib.   She has a fraternal twin who developed breast cancer at age 28. Several other family members have malignancies. 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  Prechemotherapy labs from [facility] revealed leukopenia (WBC 2300 with ANC 900). Work-up to date is negative. She has a family history of leukopenia. Bone marrow on 03/08/2015 revealed no evidence of neoplasia or metastatic disease. Marrow was normocellular for age (30-50%) with trilineage hematopoiesis. There was no increase in marrow reticulin fibers. Flow cytometry and cytogenetics were normal.  She received 4 cycles of gemcitabine and Taxotere (03/20/2015 - 05/22/2015) with Neulasta support. She received gemcitabine alone on 05/22/2015.  She was admitted on 05/24/2015 with an acute febrile illness. All cultures were negative.   Chest CT on 05/25/2015 revealed early progressive disease. Abdominal and pelvic CT scan on 05/30/2015 revealed stable to slightly increased right external iliac lymph node (1.8 x 2.5 cm).   Echocardiogram on 06/18/2015 revealed ejection fraction of 60-65%.  Echocardiogram on 10/22/2015 revealed an ejection fraction of 65%.  Bilateral lower extremity duplex on 05/30/2015 was negative.  Left upper extremity duplex on 06/08/2015 was negative.  Left lower extremity duplex on 07/19/2015 documented a DVT.  She is on Xarelto.   She is status post 6 cycles of adriamycin (06/07/2015 - 10/04/2015) with Neulasta (On-Pro) support.  Cycle #3 was complicated by an acute  left lower extremity DVT.  Cycle #6 was postponed a week secondary to neutropenia ([place] 700).  Chest, abdomen, and pelvic CT scan on 08/07/2015 revealed a partial treatment response with decrease anterior left upper pleural metastasis, stable to decreased pulmonary metastases and decreased right external iliac adenopathy. There were  no new sites of disease.  Chest, abdomen, and pelvic CT scan on 10/22/2015 revealed mild decrease in left anterior chest wall mass/internal mammary lymphadenopathy (3.1 x 4.6 cm to 2.5 x 4.4 cm).  There was decreased mild right external iliac lymphadenopathy (1.4 cm to 1.2 cm).  There was diffuse stable bilateral pulmonary metastases (largest 1.1 cm in anterior right lung).  There was no new or progressive metastatic disease.  Symptomatically, she feels good.  Exam is stable.  Plan: 1.  Review [facility] labs. 2.  Review chemotherapy plan.  Side effects of Zinecard reviewed.  Consent obtained. 3.  Cycle #7 adriamycin with Zinecard. 4.  OnPro Neulasta. 5.  Continue  Xarelto 20 mg a day. 6.  Complete slips for [facility] labs. 7.  RTC  in 3 weeks for MD assessment, review of [facility] labs, and cycle #8 adriamycin and dexrazoxane.   Lequita Asal, MD 11/01/2015, 1:46 PM

## 2015-11-01 NOTE — Progress Notes (Signed)
Patient is here for follow-up of uterine leiomyosarcoma and chemotherapy. She states that overall she has been doing well.

## 2015-11-01 NOTE — Telephone Encounter (Signed)
ANC is low at 1. Will continue treatment per Dr Mike Gip

## 2015-11-05 ENCOUNTER — Encounter: Payer: Self-pay | Admitting: Hematology and Oncology

## 2015-11-09 ENCOUNTER — Other Ambulatory Visit: Payer: Self-pay | Admitting: Hematology and Oncology

## 2015-11-09 ENCOUNTER — Other Ambulatory Visit: Payer: Self-pay

## 2015-11-09 MED ORDER — AMOXICILLIN-POT CLAVULANATE 875-125 MG PO TABS
1.0000 | ORAL_TABLET | Freq: Two times a day (BID) | ORAL | Status: DC
Start: 2015-11-09 — End: 2015-11-29

## 2015-11-22 ENCOUNTER — Encounter: Payer: Self-pay | Admitting: Hematology and Oncology

## 2015-11-28 ENCOUNTER — Encounter: Payer: Self-pay | Admitting: Hematology and Oncology

## 2015-11-29 ENCOUNTER — Inpatient Hospital Stay: Payer: 59

## 2015-11-29 ENCOUNTER — Inpatient Hospital Stay: Payer: 59 | Attending: Hematology and Oncology | Admitting: Hematology and Oncology

## 2015-11-29 ENCOUNTER — Encounter: Payer: Self-pay | Admitting: Hematology and Oncology

## 2015-11-29 ENCOUNTER — Other Ambulatory Visit: Payer: Self-pay | Admitting: Hematology and Oncology

## 2015-11-29 VITALS — BP 117/86 | HR 92 | Temp 97.8°F | Resp 18 | Ht 64.0 in | Wt 165.6 lb

## 2015-11-29 DIAGNOSIS — Z7901 Long term (current) use of anticoagulants: Secondary | ICD-10-CM | POA: Insufficient documentation

## 2015-11-29 DIAGNOSIS — I824Z2 Acute embolism and thrombosis of unspecified deep veins of left distal lower extremity: Secondary | ICD-10-CM

## 2015-11-29 DIAGNOSIS — C55 Malignant neoplasm of uterus, part unspecified: Secondary | ICD-10-CM | POA: Insufficient documentation

## 2015-11-29 DIAGNOSIS — C7802 Secondary malignant neoplasm of left lung: Secondary | ICD-10-CM

## 2015-11-29 DIAGNOSIS — R599 Enlarged lymph nodes, unspecified: Secondary | ICD-10-CM | POA: Diagnosis not present

## 2015-11-29 DIAGNOSIS — Z5111 Encounter for antineoplastic chemotherapy: Secondary | ICD-10-CM | POA: Insufficient documentation

## 2015-11-29 DIAGNOSIS — C782 Secondary malignant neoplasm of pleura: Secondary | ICD-10-CM

## 2015-11-29 DIAGNOSIS — I825Z2 Chronic embolism and thrombosis of unspecified deep veins of left distal lower extremity: Secondary | ICD-10-CM

## 2015-11-29 DIAGNOSIS — C7801 Secondary malignant neoplasm of right lung: Secondary | ICD-10-CM | POA: Diagnosis not present

## 2015-11-29 DIAGNOSIS — D72819 Decreased white blood cell count, unspecified: Secondary | ICD-10-CM

## 2015-11-29 DIAGNOSIS — Z79899 Other long term (current) drug therapy: Secondary | ICD-10-CM | POA: Diagnosis not present

## 2015-12-05 ENCOUNTER — Encounter: Payer: Self-pay | Admitting: Hematology and Oncology

## 2015-12-06 ENCOUNTER — Other Ambulatory Visit: Payer: Self-pay | Admitting: Hematology and Oncology

## 2015-12-06 ENCOUNTER — Encounter: Payer: Self-pay | Admitting: Hematology and Oncology

## 2015-12-06 ENCOUNTER — Inpatient Hospital Stay: Payer: 59

## 2015-12-06 ENCOUNTER — Inpatient Hospital Stay (HOSPITAL_BASED_OUTPATIENT_CLINIC_OR_DEPARTMENT_OTHER): Payer: 59 | Admitting: Hematology and Oncology

## 2015-12-06 VITALS — BP 125/88 | HR 82 | Temp 97.5°F | Resp 18 | Ht 64.0 in | Wt 164.5 lb

## 2015-12-06 DIAGNOSIS — Z79899 Other long term (current) drug therapy: Secondary | ICD-10-CM

## 2015-12-06 DIAGNOSIS — C7802 Secondary malignant neoplasm of left lung: Secondary | ICD-10-CM | POA: Diagnosis not present

## 2015-12-06 DIAGNOSIS — C7801 Secondary malignant neoplasm of right lung: Secondary | ICD-10-CM

## 2015-12-06 DIAGNOSIS — C782 Secondary malignant neoplasm of pleura: Secondary | ICD-10-CM | POA: Diagnosis not present

## 2015-12-06 DIAGNOSIS — C55 Malignant neoplasm of uterus, part unspecified: Secondary | ICD-10-CM | POA: Diagnosis not present

## 2015-12-06 DIAGNOSIS — Z7901 Long term (current) use of anticoagulants: Secondary | ICD-10-CM

## 2015-12-06 DIAGNOSIS — D72819 Decreased white blood cell count, unspecified: Secondary | ICD-10-CM

## 2015-12-06 DIAGNOSIS — I824Z2 Acute embolism and thrombosis of unspecified deep veins of left distal lower extremity: Secondary | ICD-10-CM

## 2015-12-06 DIAGNOSIS — R599 Enlarged lymph nodes, unspecified: Secondary | ICD-10-CM

## 2015-12-06 MED ORDER — SODIUM CHLORIDE 0.9 % IJ SOLN
10.0000 mL | INTRAMUSCULAR | Status: DC | PRN
  Administered 2015-12-06: 10 mL
  Filled 2015-12-06: qty 10

## 2015-12-06 MED ORDER — FOSAPREPITANT DIMEGLUMINE INJECTION 150 MG
Freq: Once | INTRAVENOUS | Status: AC
  Administered 2015-12-06: 12:00:00 via INTRAVENOUS
  Filled 2015-12-06: qty 5

## 2015-12-06 MED ORDER — SODIUM CHLORIDE 0.9 % IV SOLN
INTRAVENOUS | Status: DC
  Administered 2015-12-06: 12:00:00 via INTRAVENOUS
  Filled 2015-12-06: qty 1000

## 2015-12-06 MED ORDER — HEPARIN SOD (PORK) LOCK FLUSH 100 UNIT/ML IV SOLN
500.0000 [IU] | Freq: Once | INTRAVENOUS | Status: AC
  Administered 2015-12-06: 500 [IU] via INTRAVENOUS

## 2015-12-06 MED ORDER — PALONOSETRON HCL INJECTION 0.25 MG/5ML
0.2500 mg | Freq: Once | INTRAVENOUS | Status: AC
  Administered 2015-12-06: 0.25 mg via INTRAVENOUS
  Filled 2015-12-06: qty 5

## 2015-12-06 MED ORDER — PEGFILGRASTIM 6 MG/0.6ML ~~LOC~~ PSKT
6.0000 mg | PREFILLED_SYRINGE | Freq: Once | SUBCUTANEOUS | Status: AC
  Administered 2015-12-06: 6 mg via SUBCUTANEOUS
  Filled 2015-12-06: qty 0.6

## 2015-12-06 MED ORDER — OLARATUMAB CHEMO INJECTION 500 MG/50ML
15.0000 mg/kg | Freq: Once | INTRAVENOUS | Status: AC
  Administered 2015-12-06: 1120 mg via INTRAVENOUS
  Filled 2015-12-06: qty 112

## 2015-12-06 MED ORDER — LACTATED RINGERS IV SOLN
600.0000 mg/m2 | Freq: Once | INTRAVENOUS | Status: AC
  Administered 2015-12-06: 1160 mg via INTRAVENOUS
  Filled 2015-12-06: qty 91

## 2015-12-06 MED ORDER — DIPHENHYDRAMINE HCL 50 MG/ML IJ SOLN
50.0000 mg | Freq: Once | INTRAMUSCULAR | Status: AC
  Administered 2015-12-06: 50 mg via INTRAVENOUS
  Filled 2015-12-06: qty 1

## 2015-12-06 MED ORDER — HEPARIN SOD (PORK) LOCK FLUSH 100 UNIT/ML IV SOLN
500.0000 [IU] | Freq: Once | INTRAVENOUS | Status: AC | PRN
  Filled 2015-12-06: qty 5

## 2015-12-06 MED ORDER — DOXORUBICIN HCL CHEMO IV INJECTION 2 MG/ML
60.0000 mg/m2 | Freq: Once | INTRAVENOUS | Status: AC
  Administered 2015-12-06: 116 mg via INTRAVENOUS
  Filled 2015-12-06: qty 58

## 2015-12-06 NOTE — Progress Notes (Signed)
[facility] day:  11/29/2015   Chief Complaint: Cassidy Bradley is an 54 y.o. female with metastatic uterine leiomyosarcoma who is seen for assessment prior to cycle #8 adriamycin with Zinecard.  HPI: The patient was last seen in the medical oncology clinic on 11/01/2015.  At that time, she was doing well.  She received cycle #7 adriamycin with Zinecard with Neulasta support.  She states that she has continued to do well.  She notes that she overdid it and hurt her back. She feels a little tired. She states that the Zinecard took  "my hair way".  She denies any chest pain, shortness of breath, orthopnea or paroxysmal nocturnal dyspnea (PND).  She states that her left ankle remains sore after a fracture years ago.   Past Medical History  Diagnosis Date  . Breast lump 2006     both breast   . Chest wall mass   . Cancer ([place])   . Uterine cancer [facility])     Past Surgical History  Procedure Laterality Date  . Cholecystectomy  2001  . Ankle surgery  2005  . Cesarean section  1993  . Back surgery  2005  . Breast biopsy Bilateral   . Colonoscopy    . Abdominal hysterectomy  01/30/15   Family History  Problem Relation Age of Onset  . Breast cancer Sister 30  . Breast cancer Maternal Aunt   . Breast cancer Maternal Grandmother 60  . Stomach cancer Maternal Aunt   . Lung cancer Maternal Grandfather   . Colon polyps Mother   . Cancer - Colon Cousin 67    first cousin  . Lupus Father   . Esophageal cancer Maternal Uncle     Social History:  reports that she has never smoked. She has never used smokeless tobacco. She reports that she does not drink alcohol or use illicit drugs.   The patient is alone today.  Allergies:  Allergies  Allergen Reactions  . Pravastatin Hives and Nausea And Vomiting  . Sulfa Antibiotics Hives and Swelling  . Latex Rash  . Other Rash and Other (See Comments)    Pt states that she is allergic to spandex.    Current Medications: Current Outpatient Prescriptions  Medication Sig Dispense Refill  . dexamethasone (DECADRON) 4 MG tablet Take 2 tablets (8 mg total) by mouth daily. beginning the day after chemotherapy for 3 days 18 tablet 0  . lansoprazole (PREVACID) 30 MG capsule Take 30 mg by mouth as needed.    . lidocaine-prilocaine (EMLA) cream Apply 1 application topically as needed. Apply one hour prior to chemotherapy (Patient taking differently: Apply 1 application topically as needed (prior to accessing pts port). Pt applies one hour prior to chemotherapy.) 30 g 1  . loratadine (CLARITIN) 10 MG tablet Take 10 mg by mouth daily.    . magic mouthwash SOLN Take 5 mLs by mouth 3 (three) times daily as needed for mouth pain. Pt swishes and spits out medication. 240 mL 3  . nystatin (MYCOSTATIN) 100000 UNIT/ML suspension Take 5 mLs (500,000 Units total) by mouth 4 (four) times daily. 100 mL 0  . ondansetron (ZOFRAN) 8 MG tablet Take 8 mg by mouth every 8 (eight) hours as needed for nausea or vomiting.     . rivaroxaban (XARELTO) 20 MG TABS tablet Take 1 tablet (20 mg total) by mouth daily with supper. 90 tablet 1  . sucralfate (CARAFATE) 1 G tablet Take 1 tablet (  1 g total) by mouth 4 (four) times daily. 120 tablet 1   No current facility-administered medications for this visit.   Facility-Administered Medications Ordered in Other Visits  Medication Dose Route Frequency Provider Last Rate Last Dose  . 0.9 %  sodium chloride infusion   Intravenous Continuous Lequita Asal, MD 125 mL/hr at 06/28/15 1309    . sodium chloride 0.9 % injection 10 mL  10 mL Intracatheter PRN Lequita Asal, MD      . sodium chloride 0.9 % injection 10 mL  10 mL Intracatheter PRN Lequita Asal, MD   10 mL at 06/28/15 1045   Review of Systems:  GENERAL:  Feels "a little tired".  No fevers or sweats.  Weight stable. PERFORMANCE STATUS (ECOG):  1-2 HEENT:  No visual changes, runny nose, sore throat, mouth  sores or tenderness. Lungs: No shortness of breath or cough.  No hemoptysis. Cardiac:  No chest pain, palpitations, orthopnea, or PND. GI:  Reflux. No nausea, vomiting, diarrhea, constipation, melena, or hematochezia. GU:  No urgency, frequency, dysuria, or hematuria. Musculoskeletal:  No back pain.  No joint pain.  No muscle tenderness. Extremities:  Left ankle sore (fracture years ago).  Left lower extremity edema (chronic).  No bone pain. Skin:  Hair loss.  No rashes or skin changes. Neuro:  No headache, numbness or weakness, balance or coordination issues. Endocrine:  No diabetes, thyroid issues, hot flashes or night sweats. Psych:  No mood changes, depression or anxiety. Pain:  No focal pain. Review of systems:  All other systems reviewed and found to be negative.                        Physical Exam: Blood pressure 117/86, pulse 92, temperature 97.8 F (36.6 C), temperature source Tympanic, resp. rate 18, height $RemoveBe'5\' 4"'xIebUbKUN$  (1.626 m), weight 165 lb 9.1 oz (75.1 kg).  GENERAL:  Well developed, well nourished, sitting comfortably in the exam room in no acute distress. MENTAL STATUS:  Alert and oriented to person, place and time. HEAD:  Wearing a black cap.  Alopecia totalis.  Normocephalic, atraumatic, face symmetric, no Cushingoid features. EYES:  Glasses.  Brown eyes.  Pupils equal round and reactive to light and accomodation.  No conjunctivitis or scleral icterus. ENT:  Oropharynx clear without lesion.  Tongue normal. Mucous membranes moist.  RESPIRATORY:  Clear to auscultation without rales, wheezes or rhonchi. CARDIOVASCULAR:  Regular rate and rhythm without murmur, rub or gallop. No JVD. ABDOMEN:  Soft, non-tender, with active bowel sounds, and no hepatosplenomegaly.  No masses. SKIN:  No rashes, ulcers or lesions. EXTREMITIES:  Mild left lower extremity edema (stable).  No skin discoloration.  No erythema. LYMPH NODES: No palpable cervical, supraclavicular, axillary or inguinal  adenopathy  NEUROLOGICAL: Unremarkable. PSYCH:  Appropriate.  Laboratory testing: [facility] labs on 11/28/2015 included a hematocrit 33.9, hemoglobin 11.0, platelets 309,000, white count 2700 with an ANC of 1300. Comprehensive metabolic panel included a creatinine of 0.57. Liver function tests were normal.  Magnesium was 2.0.  No visits with results within 3 Day(s) from this visit. Latest known visit with results is:  Hospital Outpatient Visit on 10/22/2015  Component Date Value Ref Range Status  . Creatinine, Ser 10/22/2015 0.60  0.44 - 1.00 mg/dL Final   Assessment:  AVIELA BLUNDELL is a 54 y.o. female 54 y.o. female with stage IV uterine leiomyosarcoma. She underwent TAH/BSO on 01/30/2015. Pathology confirmed high grade leiomyosarcoma with 2 large adjacent  tumor nodules (10 cm and 5 cm) and a separate 0.5 cm serosal nodule.   Chest, abdomen, and pelvic CT scan on 02/12/2015 revealed a 1.7 x 2.3 cm right external iliac node. Pulmonary nodules were scattered throughout the lungs bilaterally. There was left internal mammary chain adenopathy measuring up to 3.6 x 2 cm.  She underwent anterior chest wall pleural-based CT guided biopsy on 02/21/2015. Pathology was consistent with metastatic leiomyosarcoma. Bone scan on 02/28/2015 revealed no evidence of metastatic disease with indeterminate uptake in the left posterior rib.   She has a fraternal twin who developed breast cancer at age 35. Several other family members have malignancies. 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  Prechemotherapy labs from [facility] revealed leukopenia (WBC 2300 with ANC 900). Work-up to date is negative. She has a family history of leukopenia. Bone marrow on 03/08/2015 revealed no evidence of neoplasia or metastatic disease. Marrow was normocellular for age (30-50%) with trilineage hematopoiesis. There was no increase in marrow  reticulin fibers. Flow cytometry and cytogenetics were normal.  She received 4 cycles of gemcitabine and Taxotere (03/20/2015 - 05/22/2015) with Neulasta support. She received gemcitabine alone on 05/22/2015.  She was admitted on 05/24/2015 with an acute febrile illness. All cultures were negative.   Chest CT on 05/25/2015 revealed early progressive disease. Abdominal and pelvic CT scan on 05/30/2015 revealed stable to slightly increased right external iliac lymph node (1.8 x 2.5 cm).   Echocardiogram on 06/18/2015 revealed ejection fraction of 60-65%.  Echocardiogram on 10/22/2015 revealed an ejection fraction of 65%.  Bilateral lower extremity duplex on 05/30/2015 was negative.  Left upper extremity duplex on 06/08/2015 was negative.  Left lower extremity duplex on 07/19/2015 documented a DVT.  She is on Xarelto.   She is status post 7 cycles of adriamycin (06/07/2015 - 11/01/2015) with Neulasta (On-Pro) support.  Cycle #3 was complicated by an acute  left lower extremity DVT.  Cycle #6 was postponed a week secondary to neutropenia ([place] 700).  Zinecard was added with cycle #7.  Chest, abdomen, and pelvic CT scan on 08/07/2015 revealed a partial treatment response with decrease anterior left upper pleural metastasis, stable to decreased pulmonary metastases and decreased right external iliac adenopathy. There were no new sites of disease.  Chest, abdomen, and pelvic CT scan on 10/22/2015 revealed mild decrease in left anterior chest wall mass/internal mammary lymphadenopathy (3.1 x 4.6 cm to 2.5 x 4.4 cm).  There was decreased mild right external iliac lymphadenopathy (1.4 cm to 1.2 cm).  There was diffuse stable bilateral pulmonary metastases (largest 1.1 cm in anterior right lung).  There was no new or progressive metastatic disease.  Symptomatically, she is a little fatigued.  Exam is stable.  Plan: 1.  Review [facility] labs. 2.  Discuss upcoming availability of olaratumab [name].   Patient wishes to postpone treatment until next week to add to current regimen.  Review recent clinic experience and severe allergic reaction (incidence 14%) and plan for premedications.  Information on drug provided patient. 3.  Confirm preauthorization of olaratumab 15 mg/kg IV day 1 and 8 every 3 weeks. 4.  Slips for [facility]. 5.  RTC on 12/06/2015 for MD assess, review of [facility] labs, cycle #8 adriamycin, Zinecard plus cycle #1 Lartruvo. 6.  RTC on 12/13/2015 for Lartruvo. 7.  Schedule chest CT and MUGA on 12/25/2015 8.  Continue Xarelto. 9.  RTC on 12/27/2015 for MD assess, review of [facility] labs, chest CT and MUGA,  and cycle #9 adriamycin, Zinecard plus cycle #2 Lartruvo.   Lequita Asal, MD 11/29/2015

## 2015-12-06 NOTE — Progress Notes (Signed)
Patient is here for follow-up and chemotherapy treatment. Patient states that she is fatigued, but overall she has been doing good. Her appetite has been very good.

## 2015-12-06 NOTE — Progress Notes (Signed)
[facility] day:  12/06/2015   Chief Complaint: Cassidy Bradley is an 54 y.o. female with metastatic uterine leiomyosarcoma who is seen for assessment prior to cycle #8 adriamycin with Zinecard plus olaratumab [name].  HPI: The patient was last seen in the medical oncology clinic on 11/29/2015.  At that time, she was doing well.  Decision was made to postpone cycle #8 adriamycin until preauthorization for the addition of olaratumab [name].  Symptomatically, she feels a little tired.  She feels she may have overdone it.  She denies any pain.   Past Medical History  Diagnosis Date  . Breast lump 2006     both breast   . Chest wall mass   . Cancer ([place])   . Uterine cancer [facility])     Past Surgical History  Procedure Laterality Date  . Cholecystectomy  2001  . Ankle surgery  2005  . Cesarean section  1993  . Back surgery  2005  . Breast biopsy Bilateral   . Colonoscopy    . Abdominal hysterectomy  01/30/15   Family History  Problem Relation Age of Onset  . Breast cancer Sister 60  . Breast cancer Maternal Aunt   . Breast cancer Maternal Grandmother 60  . Stomach cancer Maternal Aunt   . Lung cancer Maternal Grandfather   . Colon polyps Mother   . Cancer - Colon Cousin 52    first cousin  . Lupus Father   . Esophageal cancer Maternal Uncle     Social History:  reports that she has never smoked. She has never used smokeless tobacco. She reports that she does not drink alcohol or use illicit drugs.  The patient is accompanied by her husband.  Allergies:  Allergies  Allergen Reactions  . Pravastatin Hives and Nausea And Vomiting  . Sulfa Antibiotics Hives and Swelling  . Latex Rash  . Other Rash and Other (See Comments)    Pt states that she is allergic to spandex.   Current Medications: Current Outpatient Prescriptions  Medication Sig Dispense Refill  . dexamethasone (DECADRON) 4 MG tablet Take 2 tablets (8 mg total)  by mouth daily. beginning the day after chemotherapy for 3 days 18 tablet 0  . lansoprazole (PREVACID) 30 MG capsule Take 30 mg by mouth as needed.    . lidocaine-prilocaine (EMLA) cream Apply 1 application topically as needed. Apply one hour prior to chemotherapy (Patient taking differently: Apply 1 application topically as needed (prior to accessing pts port). Pt applies one hour prior to chemotherapy.) 30 g 1  . loratadine (CLARITIN) 10 MG tablet Take 10 mg by mouth daily.    . magic mouthwash SOLN Take 5 mLs by mouth 3 (three) times daily as needed for mouth pain. Pt swishes and spits out medication. 240 mL 3  . nystatin (MYCOSTATIN) 100000 UNIT/ML suspension Take 5 mLs (500,000 Units total) by mouth 4 (four) times daily. 100 mL 0  . ondansetron (ZOFRAN) 8 MG tablet Take 8 mg by mouth every 8 (eight) hours as needed for nausea or vomiting.     . rivaroxaban (XARELTO) 20 MG TABS tablet Take 1 tablet (20 mg total) by mouth daily with supper. 90 tablet 1  . sucralfate (CARAFATE) 1 G tablet Take 1 tablet (1 g total) by mouth 4 (four) times daily. 120 tablet 1   No current facility-administered medications for this visit.   Facility-Administered Medications Ordered in Other Visits  Medication Dose Route Frequency Provider  Last Rate Last Dose  . 0.9 %  sodium chloride infusion   Intravenous Continuous Lequita Asal, MD 125 mL/hr at 06/28/15 1309    . sodium chloride 0.9 % injection 10 mL  10 mL Intracatheter PRN Lequita Asal, MD      . sodium chloride 0.9 % injection 10 mL  10 mL Intracatheter PRN Lequita Asal, MD   10 mL at 06/28/15 1045   Review of Systems:  GENERAL:  Feels "a little tired".  No fevers or sweats.  Weight stable. PERFORMANCE STATUS (ECOG):  1-2 HEENT:  No visual changes, runny nose, sore throat, mouth sores or tenderness. Lungs: No shortness of breath or cough.  No hemoptysis. Cardiac:  No chest pain, palpitations, orthopnea, or PND. GI:  Reflux. No nausea,  vomiting, diarrhea, constipation, melena, or hematochezia. GU:  No urgency, frequency, dysuria, or hematuria. Musculoskeletal:  No back pain.  No joint pain.  No muscle tenderness. Extremities:  Left ankle sore (fracture years ago).  Left lower extremity edema (chronic).  No bone pain. Skin:  No rashes or skin changes. Neuro:  No headache, numbness or weakness, balance or coordination issues. Endocrine:  No diabetes, thyroid issues, hot flashes or night sweats. Psych:  No mood changes, depression or anxiety. Pain:  No focal pain. Review of systems:  All other systems reviewed and found to be negative.                        Physical Exam: Blood pressure 125/88, pulse 82, temperature 97.5 F (36.4 C), temperature source Tympanic, resp. rate 18, height $RemoveBe'5\' 4"'JBBAwbJOG$  (1.626 m), weight 164 lb 7.4 oz (74.6 kg).  GENERAL:  Well developed, well nourished, sitting comfortably in the exam room in no acute distress. MENTAL STATUS:  Alert and oriented to person, place and time. HEAD:  Alopecia totalis.  Normocephalic, atraumatic, face symmetric, no Cushingoid features. EYES:  Glasses.  Brown eyes.  Pupils equal round and reactive to light and accomodation.  No conjunctivitis or scleral icterus. ENT:  Oropharynx clear without lesion.  Tongue normal. Mucous membranes moist.  RESPIRATORY:  Clear to auscultation without rales, wheezes or rhonchi. CARDIOVASCULAR:  Regular rate and rhythm without murmur, rub or gallop. No JVD. ABDOMEN:  Soft, non-tender, with active bowel sounds, and no hepatosplenomegaly.  No masses. SKIN:  No rashes, ulcers or lesions. EXTREMITIES:  Mild left lower extremity edema (stable).  No skin discoloration.  No erythema. LYMPH NODES: No palpable cervical, supraclavicular, axillary or inguinal adenopathy  NEUROLOGICAL: Unremarkable. PSYCH:  Appropriate.  Laboratory testing: [facility] labs on 11/28/2015 included a hematocrit 33.9, hemoglobin 11.0, platelets 309,000, white count 2700 with an  ANC of 1300. Comprehensive metabolic panel included a creatinine of 0.57. Liver function tests were normal.  Magnesium was 2.0.  No visits with results within 3 Day(s) from this visit. Latest known visit with results is:  Hospital Outpatient Visit on 10/22/2015  Component Date Value Ref Range Status  . Creatinine, Ser 10/22/2015 0.60  0.44 - 1.00 mg/dL Final   Assessment:  CRETA DORAME is a 54 y.o. female 54 y.o. female with stage IV uterine leiomyosarcoma. She underwent TAH/BSO on 01/30/2015. Pathology confirmed high grade leiomyosarcoma with 2 large adjacent tumor nodules (10 cm and 5 cm) and a separate 0.5 cm serosal nodule.   Chest, abdomen, and pelvic CT scan on 02/12/2015 revealed a 1.7 x 2.3 cm right external iliac node. Pulmonary nodules were scattered throughout the lungs bilaterally. There  was left internal mammary chain adenopathy measuring up to 3.6 x 2 cm.  She underwent anterior chest wall pleural-based CT guided biopsy on 02/21/2015. Pathology was consistent with metastatic leiomyosarcoma. Bone scan on 02/28/2015 revealed no evidence of metastatic disease with indeterminate uptake in the left posterior rib.   She has a fraternal twin who developed breast cancer at age 25. Several other family members have malignancies. 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  Prechemotherapy labs from [facility] revealed leukopenia (WBC 2300 with ANC 900). Work-up to date is negative. She has a family history of leukopenia. Bone marrow on 03/08/2015 revealed no evidence of neoplasia or metastatic disease. Marrow was normocellular for age (30-50%) with trilineage hematopoiesis. There was no increase in marrow reticulin fibers. Flow cytometry and cytogenetics were normal.  She received 4 cycles of gemcitabine and Taxotere (03/20/2015 - 05/22/2015) with Neulasta support. She received gemcitabine alone on  05/22/2015.  She was admitted on 05/24/2015 with an acute febrile illness. All cultures were negative.   Chest CT on 05/25/2015 revealed early progressive disease. Abdominal and pelvic CT scan on 05/30/2015 revealed stable to slightly increased right external iliac lymph node (1.8 x 2.5 cm).   Echocardiogram on 06/18/2015 revealed ejection fraction of 60-65%.  Echocardiogram on 10/22/2015 revealed an ejection fraction of 65%.  Bilateral lower extremity duplex on 05/30/2015 was negative.  Left upper extremity duplex on 06/08/2015 was negative.  Left lower extremity duplex on 07/19/2015 documented a DVT.  She is on Xarelto.   She is status post 7 cycles of adriamycin (06/07/2015 - 11/01/2015) with Neulasta (On-Pro) support.  Cycle #3 was complicated by an acute  left lower extremity DVT.  Cycle #6 was postponed a week secondary to neutropenia ([place] [address]).  Zinecard was added with cycle #7.  Chest, abdomen, and pelvic CT scan on 08/07/2015 revealed a partial treatment response with decrease anterior left upper pleural metastasis, stable to decreased pulmonary metastases and decreased right external iliac adenopathy. There were no new sites of disease.  Chest, abdomen, and pelvic CT scan on 10/22/2015 revealed mild decrease in left anterior chest wall mass/internal mammary lymphadenopathy (3.1 x 4.6 cm to 2.5 x 4.4 cm).  There was decreased mild right external iliac lymphadenopathy (1.4 cm to 1.2 cm).  There was diffuse stable bilateral pulmonary metastases (largest 1.1 cm in anterior right lung).  There was no new or progressive metastatic disease.  Symptomatically, she is a little fatigued.  Exam is stable.  Plan: 1.  Review [facility] labs. 2.  Cycle #8 adriamycin, Zinecard plus day 1 of cycle #1 Lartruvo.   3.  Premeds for Lartruvo:  Benadryl 50 mg IV and Decadron 12 mg IV 4.  RTC on 12/13/2015 for MD assess, review of [facility] labs, and day 8 of cycle #1 Lartruvo.   Lequita Asal,  MD 12/06/2015, 10:43 AM

## 2015-12-12 ENCOUNTER — Encounter: Payer: Self-pay | Admitting: Hematology and Oncology

## 2015-12-13 ENCOUNTER — Encounter: Payer: Self-pay | Admitting: Hematology and Oncology

## 2015-12-13 ENCOUNTER — Inpatient Hospital Stay (HOSPITAL_BASED_OUTPATIENT_CLINIC_OR_DEPARTMENT_OTHER): Payer: 59 | Admitting: Hematology and Oncology

## 2015-12-13 ENCOUNTER — Other Ambulatory Visit: Payer: Self-pay

## 2015-12-13 ENCOUNTER — Other Ambulatory Visit: Payer: Self-pay | Admitting: Hematology and Oncology

## 2015-12-13 ENCOUNTER — Inpatient Hospital Stay: Payer: 59

## 2015-12-13 VITALS — BP 157/90 | HR 96 | Temp 97.7°F | Resp 18 | Ht 64.0 in | Wt 161.4 lb

## 2015-12-13 DIAGNOSIS — I824Z2 Acute embolism and thrombosis of unspecified deep veins of left distal lower extremity: Secondary | ICD-10-CM

## 2015-12-13 DIAGNOSIS — C7802 Secondary malignant neoplasm of left lung: Secondary | ICD-10-CM

## 2015-12-13 DIAGNOSIS — C55 Malignant neoplasm of uterus, part unspecified: Secondary | ICD-10-CM

## 2015-12-13 DIAGNOSIS — R599 Enlarged lymph nodes, unspecified: Secondary | ICD-10-CM

## 2015-12-13 DIAGNOSIS — C782 Secondary malignant neoplasm of pleura: Secondary | ICD-10-CM | POA: Diagnosis not present

## 2015-12-13 DIAGNOSIS — Z7901 Long term (current) use of anticoagulants: Secondary | ICD-10-CM

## 2015-12-13 DIAGNOSIS — C7801 Secondary malignant neoplasm of right lung: Secondary | ICD-10-CM | POA: Diagnosis not present

## 2015-12-13 DIAGNOSIS — D72819 Decreased white blood cell count, unspecified: Secondary | ICD-10-CM

## 2015-12-13 DIAGNOSIS — Z79899 Other long term (current) drug therapy: Secondary | ICD-10-CM

## 2015-12-13 MED ORDER — SODIUM CHLORIDE 0.9 % IV SOLN
12.0000 mg | Freq: Once | INTRAVENOUS | Status: AC
  Administered 2015-12-13: 12 mg via INTRAVENOUS
  Filled 2015-12-13: qty 1.2

## 2015-12-13 MED ORDER — DIPHENHYDRAMINE HCL 50 MG/ML IJ SOLN
50.0000 mg | Freq: Once | INTRAMUSCULAR | Status: AC
  Administered 2015-12-13: 50 mg via INTRAVENOUS
  Filled 2015-12-13: qty 1

## 2015-12-13 MED ORDER — DIPHENHYDRAMINE HCL 50 MG/ML IJ SOLN: 50.0000 mg | Freq: Once | INTRAMUSCULAR | Status: DC

## 2015-12-13 MED ORDER — DEXAMETHASONE 4 MG PO TABS: 8.0000 mg | ORAL_TABLET | Freq: Every day | ORAL | Status: DC

## 2015-12-13 MED ORDER — SODIUM CHLORIDE 0.9 % IV SOLN
15.0000 mg/kg | Freq: Once | INTRAVENOUS | Status: DC
  Filled 2015-12-13: qty 112

## 2015-12-13 MED ORDER — SODIUM CHLORIDE 0.9 % IV SOLN
1120.0000 mg | Freq: Once | INTRAVENOUS | Status: AC
  Administered 2015-12-13: 1120 mg via INTRAVENOUS
  Filled 2015-12-13: qty 112

## 2015-12-13 MED ORDER — HEPARIN SOD (PORK) LOCK FLUSH 100 UNIT/ML IV SOLN
500.0000 [IU] | Freq: Once | INTRAVENOUS | Status: AC
  Administered 2015-12-13: 500 [IU] via INTRAVENOUS

## 2015-12-13 MED ORDER — SODIUM CHLORIDE 0.9 % IV SOLN
Freq: Once | INTRAVENOUS | Status: AC
  Administered 2015-12-13: 15:00:00 via INTRAVENOUS
  Filled 2015-12-13: qty 1000

## 2015-12-13 MED ORDER — SODIUM CHLORIDE 0.9 % IV SOLN
12.0000 mg | Freq: Once | INTRAVENOUS | Status: DC
  Filled 2015-12-13 (×2): qty 1.2

## 2015-12-13 NOTE — Progress Notes (Signed)
[facility]  Clinic day:  12/13/2015   Chief Complaint: Cassidy Bradley is an 54 y.o. female with metastatic uterine leiomyosarcoma who is seen for assessment prior to day 8 of cycle #1 olaratumab Drucilla Schmidt).  HPI: The patient was last seen in the medical oncology clinic on 12/06/2015.  At that time, she received cycle #8 adriamycin with Zinecard plus day 1 of cycle #1 olaratumab.  She received Neulasta (OnPro) support.  During the interim, she has done well.  She notes that post Neulasta, she became tired.  She is feeling good today.  She denies any nausea or vomiting.   Past Medical History  Diagnosis Date  . Breast lump 2006     both breast   . Chest wall mass   . Cancer ([facility])   . Uterine cancer [facility])     Past Surgical History  Procedure Laterality Date  . Cholecystectomy  2001  . Ankle surgery  2005  . Cesarean section  1993  . Back surgery  2005  . Breast biopsy Bilateral   . Colonoscopy    . Abdominal hysterectomy  01/30/15   Family History  Problem Relation Age of Onset  . Breast cancer Sister 88  . Breast cancer Maternal Aunt   . Breast cancer Maternal Grandmother 60  . Stomach cancer Maternal Aunt   . Lung cancer Maternal Grandfather   . Colon polyps Mother   . Cancer - Colon Cousin 56    first cousin  . Lupus Father   . Esophageal cancer Maternal Uncle     Social History:  reports that she has never smoked. She has never used smokeless tobacco. She reports that she does not drink alcohol or use illicit drugs.   The patient is alone today.  Allergies:  Allergies  Allergen Reactions  . Pravastatin Hives and Nausea And Vomiting  . Sulfa Antibiotics Hives and Swelling  . Latex Rash  . Other Rash and Other (See Comments)    Pt states that she is allergic to spandex.   Current Medications: Current Outpatient Prescriptions  Medication Sig Dispense Refill  . dexamethasone (DECADRON) 4 MG tablet Take 2 tablets (8 mg total)  by mouth daily. beginning the day after chemotherapy for 3 days 18 tablet 0  . lansoprazole (PREVACID) 30 MG capsule Take 30 mg by mouth as needed.    . lidocaine-prilocaine (EMLA) cream Apply 1 application topically as needed. Apply one hour prior to chemotherapy (Patient taking differently: Apply 1 application topically as needed (prior to accessing pts port). Pt applies one hour prior to chemotherapy.) 30 g 1  . loratadine (CLARITIN) 10 MG tablet Take 10 mg by mouth daily.    . magic mouthwash SOLN Take 5 mLs by mouth 3 (three) times daily as needed for mouth pain. Pt swishes and spits out medication. 240 mL 3  . nystatin (MYCOSTATIN) 100000 UNIT/ML suspension Take 5 mLs (500,000 Units total) by mouth 4 (four) times daily. 100 mL 0  . ondansetron (ZOFRAN) 8 MG tablet Take 8 mg by mouth every 8 (eight) hours as needed for nausea or vomiting.     . rivaroxaban (XARELTO) 20 MG TABS tablet Take 1 tablet (20 mg total) by mouth daily with supper. 90 tablet 1  . sucralfate (CARAFATE) 1 G tablet Take 1 tablet (1 g total) by mouth 4 (four) times daily. 120 tablet 1   No current facility-administered medications for this visit.   Facility-Administered Medications Ordered in Other  Visits  Medication Dose Route Frequency Provider Last Rate Last Dose  . 0.9 %  sodium chloride infusion   Intravenous Continuous Lequita Asal, MD 125 mL/hr at 06/28/15 1309    . dexamethasone (DECADRON) 12 mg in sodium chloride 0.9 % 50 mL IVPB  12 mg Intravenous Once Lequita Asal, MD      . diphenhydrAMINE (BENADRYL) injection 50 mg  50 mg Intravenous Once Lequita Asal, MD      . olaratumab (LARTRUVO) 1,120 mg in sodium chloride 0.9 % 138 mL chemo infusion  15 mg/kg Intravenous Once Lequita Asal, MD      . sodium chloride 0.9 % injection 10 mL  10 mL Intracatheter PRN Lequita Asal, MD      . sodium chloride 0.9 % injection 10 mL  10 mL Intracatheter PRN Lequita Asal, MD   10 mL at 06/28/15  1045   Review of Systems:  GENERAL:  Feels good.  No fevers or sweats.  Weight down 3 pounds. PERFORMANCE STATUS (ECOG):  1-2 HEENT:  No visual changes, runny nose, sore throat, mouth sores or tenderness. Lungs: No shortness of breath or cough.  No hemoptysis. Cardiac:  No chest pain, palpitations, orthopnea, or PND. GI:  Reflux. No nausea, vomiting, diarrhea, constipation, melena, or hematochezia. GU:  No urgency, frequency, dysuria, or hematuria. Musculoskeletal:  No back pain.  No joint pain.  No muscle tenderness. Extremities: Left lower extremity edema (chronic).  No bone pain. Skin:  No rashes or skin changes. Neuro:  No headache, numbness or weakness, balance or coordination issues. Endocrine:  No diabetes, thyroid issues, hot flashes or night sweats. Psych:  No mood changes, depression or anxiety. Pain:  No focal pain. Review of systems:  All other systems reviewed and found to be negative.                        Physical Exam: Blood pressure 157/90, pulse 96, temperature 97.7 F (36.5 C), temperature source Tympanic, resp. rate 18, height '5\' 4"'$  (1.626 m), weight 161 lb 6 oz (73.2 kg).  GENERAL:  Well developed, well nourished, sitting comfortably in the exam room in no acute distress. MENTAL STATUS:  Alert and oriented to person, place and time. HEAD:  Wearing a black cap.  Alopecia totalis.  Normocephalic, atraumatic, face symmetric, no Cushingoid features. EYES:  Glasses.  Brown eyes.  Pupils equal round and reactive to light and accomodation.  No conjunctivitis or scleral icterus. ENT:  Oropharynx clear without lesion.  Tongue normal. Mucous membranes moist.  RESPIRATORY:  Clear to auscultation without rales, wheezes or rhonchi. CARDIOVASCULAR:  Regular rate and rhythm without murmur, rub or gallop. No JVD. ABDOMEN:  Soft, non-tender, with active bowel sounds, and no hepatosplenomegaly.  No masses. SKIN:  No rashes, ulcers or lesions. EXTREMITIES:  Mild left lower extremity  edema (stable).  No skin discoloration.  No erythema. LYMPH NODES: No palpable cervical, supraclavicular, axillary or inguinal adenopathy  NEUROLOGICAL: Unremarkable. PSYCH:  Appropriate.  Laboratory testing: [facility] labs on 12/12/2015 included a hematocrit 34.8, hemoglobin 11.4, platelets 81,000, white count 2500 with an ANC of 1400. Comprehensive metabolic panel included a creatinine of 0.55. Liver function tests were normal.  Magnesium was 2.0.  Phosphorus was 3.2.  No visits with results within 3 Day(s) from this visit. Latest known visit with results is:  Hospital Outpatient Visit on 10/22/2015  Component Date Value Ref Range Status  . Creatinine, Ser 10/22/2015 0.60  0.44 - 1.00 mg/dL Final   Assessment:  Cassidy Bradley is a 54 y.o. female 54 y.o. female with stage IV uterine leiomyosarcoma. She underwent TAH/BSO on 01/30/2015. Pathology confirmed high grade leiomyosarcoma with 2 large adjacent tumor nodules (10 cm and 5 cm) and a separate 0.5 cm serosal nodule.   Chest, abdomen, and pelvic CT scan on 02/12/2015 revealed a 1.7 x 2.3 cm right external iliac node. Pulmonary nodules were scattered throughout the lungs bilaterally. There was left internal mammary chain adenopathy measuring up to 3.6 x 2 cm.  She underwent anterior chest wall pleural-based CT guided biopsy on 02/21/2015. Pathology was consistent with metastatic leiomyosarcoma. Bone scan on 02/28/2015 revealed no evidence of metastatic disease with indeterminate uptake in the left posterior rib.   She has a fraternal twin who developed breast cancer at age 14. Several other family members have malignancies. 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  Prechemotherapy labs from [facility] revealed leukopenia (WBC 2300 with ANC 900). Work-up to date is negative. She has a family history of leukopenia. Bone marrow on 03/08/2015 revealed no  evidence of neoplasia or metastatic disease. Marrow was normocellular for age (30-50%) with trilineage hematopoiesis. There was no increase in marrow reticulin fibers. Flow cytometry and cytogenetics were normal.  She received 4 cycles of gemcitabine and Taxotere (03/20/2015 - 05/22/2015) with Neulasta support. She received gemcitabine alone on 05/22/2015.  She was admitted on 05/24/2015 with an acute febrile illness. All cultures were negative.   Chest CT on 05/25/2015 revealed early progressive disease. Abdominal and pelvic CT scan on 05/30/2015 revealed stable to slightly increased right external iliac lymph node (1.8 x 2.5 cm).   Echocardiogram on 06/18/2015 revealed ejection fraction of 60-65%.  Echocardiogram on 10/22/2015 revealed an ejection fraction of 65%.  Bilateral lower extremity duplex on 05/30/2015 was negative.  Left upper extremity duplex on 06/08/2015 was negative.  Left lower extremity duplex on 07/19/2015 documented a DVT.  She is on Xarelto.   She is status post 8 cycles of adriamycin (06/07/2015 - 12/06/2015) with Neulasta (On-Pro) support.  Cycle #3 was complicated by an acute  left lower extremity DVT.  Cycle #6 was postponed a week secondary to neutropenia ([place] [address]).  Zinecard was added with cycle #7.  Olaratumab [name] was added with cycle #8.  Chest, abdomen, and pelvic CT scan on 08/07/2015 revealed a partial treatment response with decrease anterior left upper pleural metastasis, stable to decreased pulmonary metastases and decreased right external iliac adenopathy. There were no new sites of disease.  Chest, abdomen, and pelvic CT scan on 10/22/2015 revealed mild decrease in left anterior chest wall mass/internal mammary lymphadenopathy (3.1 x 4.6 cm to 2.5 x 4.4 cm).  There was decreased mild right external iliac lymphadenopathy (1.4 cm to 1.2 cm).  There was diffuse stable bilateral pulmonary metastases (largest 1.1 cm in anterior right lung).  There was no new  or progressive metastatic disease.  Symptomatically, she feels good.  Exam is stable.  Plan: 1.  Review [facility] labs.  Patient has slip for labs next week. 2.  Day 8 of cycle #1 Lartruvo. 3.  Chest CT and MUGA on 12/25/2015 4.  Continue Xarelto. 5.  RTC on 12/27/2015 for MD assess, review of [facility] labs, chest CT and MUGA, and cycle #9 adriamycin, Zinecard plus cycle #2 Lartruvo.   Lequita Asal, MD 12/13/2015, 1:50 PM

## 2015-12-19 ENCOUNTER — Encounter: Payer: Self-pay | Admitting: Hematology and Oncology

## 2015-12-20 ENCOUNTER — Ambulatory Visit: Payer: 59

## 2015-12-25 ENCOUNTER — Encounter
Admission: RE | Admit: 2015-12-25 | Discharge: 2015-12-25 | Disposition: A | Discharge status: Home / Self Care | Payer: 59 | Source: Ambulatory Visit | Attending: Hematology and Oncology | Admitting: Hematology and Oncology

## 2015-12-25 DIAGNOSIS — C55 Malignant neoplasm of uterus, part unspecified: Secondary | ICD-10-CM | POA: Diagnosis not present

## 2015-12-25 MED ORDER — TECHNETIUM TC 99M-LABELED RED BLOOD CELLS IV KIT
20.3020 | PACK | Freq: Once | INTRAVENOUS | Status: AC | PRN
  Administered 2015-12-25: 20.302 via INTRAVENOUS

## 2015-12-26 ENCOUNTER — Encounter: Payer: Self-pay | Admitting: Hematology and Oncology

## 2015-12-27 ENCOUNTER — Inpatient Hospital Stay: Payer: 59

## 2015-12-27 ENCOUNTER — Inpatient Hospital Stay (HOSPITAL_BASED_OUTPATIENT_CLINIC_OR_DEPARTMENT_OTHER): Payer: 59 | Admitting: Family Medicine

## 2015-12-27 ENCOUNTER — Other Ambulatory Visit: Payer: Self-pay | Admitting: Hematology and Oncology

## 2015-12-27 VITALS — BP 123/87 | HR 96 | Temp 96.5°F | Resp 18 | Wt 163.6 lb

## 2015-12-27 DIAGNOSIS — D72819 Decreased white blood cell count, unspecified: Secondary | ICD-10-CM

## 2015-12-27 DIAGNOSIS — C782 Secondary malignant neoplasm of pleura: Secondary | ICD-10-CM | POA: Diagnosis not present

## 2015-12-27 DIAGNOSIS — I824Z2 Acute embolism and thrombosis of unspecified deep veins of left distal lower extremity: Secondary | ICD-10-CM

## 2015-12-27 DIAGNOSIS — C7802 Secondary malignant neoplasm of left lung: Secondary | ICD-10-CM | POA: Diagnosis not present

## 2015-12-27 DIAGNOSIS — Z7901 Long term (current) use of anticoagulants: Secondary | ICD-10-CM

## 2015-12-27 DIAGNOSIS — C7801 Secondary malignant neoplasm of right lung: Secondary | ICD-10-CM | POA: Diagnosis not present

## 2015-12-27 DIAGNOSIS — Z79899 Other long term (current) drug therapy: Secondary | ICD-10-CM

## 2015-12-27 DIAGNOSIS — R599 Enlarged lymph nodes, unspecified: Secondary | ICD-10-CM

## 2015-12-27 DIAGNOSIS — C55 Malignant neoplasm of uterus, part unspecified: Secondary | ICD-10-CM

## 2015-12-27 MED ORDER — HEPARIN SOD (PORK) LOCK FLUSH 100 UNIT/ML IV SOLN
500.0000 [IU] | Freq: Once | INTRAVENOUS | Status: AC
  Administered 2015-12-27: 500 [IU] via INTRAVENOUS
  Filled 2015-12-27: qty 5

## 2015-12-27 MED ORDER — SODIUM CHLORIDE 0.9 % IV SOLN
15.0000 mg/kg | Freq: Once | INTRAVENOUS | Status: AC
  Administered 2015-12-27: 1110 mg via INTRAVENOUS
  Filled 2015-12-27: qty 111

## 2015-12-27 MED ORDER — SODIUM CHLORIDE 0.9 % IV SOLN
INTRAVENOUS | Status: DC
  Administered 2015-12-27: 11:00:00 via INTRAVENOUS
  Filled 2015-12-27: qty 1000

## 2015-12-27 MED ORDER — SODIUM CHLORIDE 0.9 % IJ SOLN
10.0000 mL | INTRAMUSCULAR | Status: DC | PRN
  Administered 2015-12-27: 10 mL via INTRAVENOUS
  Filled 2015-12-27: qty 10

## 2015-12-27 NOTE — Progress Notes (Signed)
Patient states she has been having some pain in her left thigh for the past week.  States it is just sore today.

## 2015-12-27 NOTE — Progress Notes (Signed)
Cassidy Bradley  [phone]  [phone]     Cassidy Bradley DOB: 07-Nov-1961  MR#: [id number]  FHQ#:[id number]  Patient Care Team: [place] Pink, MD as PCP - General (Family Medicine) Robert Bellow, MD (General Surgery) Lequita Asal, MD as Referring Physician (Hematology and Oncology) Mellody Drown, MD as Referring Physician (Obstetrics and Gynecology)  CHIEF COMPLAINT:  Chief Complaint  Patient presents with  . Uterine Leiomyosaarcoma  Cassidy Bradley is an 54 y.o. female with metastatic uterine leiomyosarcoma who is seen for assessment prior to day 1 of cycle #2 olaratumab [name].  INTERVAL HISTORY:  Patient is here for further evaluation and treatment consideration regarding metastatic uterine leiomyosarcoma. She was last evaluated in clinic on 12/13/15 when she began cycle 8 day 1 of Adriamycin, Zinecard, and Lartruvo. Her most recent MUGA scan was performed on 12/25/15 and noted a drop in EF from 65% to 52%. After discussion with Dr. Mike Gip, decision to hold any further Adriamycin has been made. Therefore she will only receive Lartruvo today. She overall feels very well. Denies any sob, cough, lower extremity edema.    REVIEW OF SYSTEMS:   Review of Systems  Constitutional: Negative for fever, chills, weight loss, malaise/fatigue and diaphoresis.  HENT: Negative.   Eyes: Negative.   Respiratory: Negative for cough, hemoptysis, sputum production, shortness of breath and wheezing.   Cardiovascular: Negative for chest pain, palpitations, orthopnea, claudication, leg swelling and PND.  Gastrointestinal: Negative for heartburn, nausea, vomiting, abdominal pain, diarrhea, constipation, blood in stool and melena.  Genitourinary: Negative.   Musculoskeletal: Negative.   Skin: Negative.   Neurological: Negative for dizziness, tingling, focal weakness, seizures and weakness.  Endo/Heme/Allergies: Does not bruise/bleed easily.    Psychiatric/Behavioral: Negative for depression. The patient is not nervous/anxious and does not have insomnia.     As per HPI. Otherwise, a complete review of systems is negatve.   PAST MEDICAL HISTORY: Past Medical History  Diagnosis Date  . Breast lump 2006     both breast   . Chest wall mass   . Cancer ([place])   . Uterine cancer ([place])     PAST SURGICAL HISTORY: Past Surgical History  Procedure Laterality Date  . Cholecystectomy  2001  . Ankle surgery  2005  . Cesarean section  1993  . Back surgery  2005  . Breast biopsy Bilateral   . Colonoscopy    . Abdominal hysterectomy  01/30/15    FAMILY HISTORY Family History  Problem Relation Age of Onset  . Breast cancer Sister 74  . Breast cancer Maternal Aunt   . Breast cancer Maternal Grandmother 60  . Stomach cancer Maternal Aunt   . Lung cancer Maternal Grandfather   . Colon polyps Mother   . Cancer - Colon Cousin 75    first cousin  . Lupus Father   . Esophageal cancer Maternal Uncle     GYNECOLOGIC HISTORY:  No LMP recorded. Patient has had a hysterectomy.     ADVANCED DIRECTIVES:    HEALTH MAINTENANCE: Social History  Substance Use Topics  . Smoking status: Never Smoker   . Smokeless tobacco: Never Used  . Alcohol Use: No     Colonoscopy:  PAP:  Bone density:  Lipid panel:  Allergies  Allergen Reactions  . Pravastatin Hives and Nausea And Vomiting  . Sulfa Antibiotics Hives and Swelling  . Latex Rash  . Other Rash and Other (See Comments)    Pt states that she is allergic to  spandex.    Current Outpatient Prescriptions  Medication Sig Dispense Refill  . dexamethasone (DECADRON) 4 MG tablet Take 2 tablets (8 mg total) by mouth daily. beginning the day after chemotherapy for 3 days 18 tablet 0  . lansoprazole (PREVACID) 30 MG capsule Take 30 mg by mouth as needed.    . lidocaine-prilocaine (EMLA) cream Apply 1 application topically as needed. Apply one hour prior to chemotherapy (Patient  taking differently: Apply 1 application topically as needed (prior to accessing pts port). Pt applies one hour prior to chemotherapy.) 30 g 1  . loratadine (CLARITIN) 10 MG tablet Take 10 mg by mouth daily.    . magic mouthwash SOLN Take 5 mLs by mouth 3 (three) times daily as needed for mouth pain. Pt swishes and spits out medication. 240 mL 3  . nystatin (MYCOSTATIN) 100000 UNIT/ML suspension Take 5 mLs (500,000 Units total) by mouth 4 (four) times daily. 100 mL 0  . ondansetron (ZOFRAN) 8 MG tablet Take 8 mg by mouth every 8 (eight) hours as needed for nausea or vomiting.     . rivaroxaban (XARELTO) 20 MG TABS tablet Take 1 tablet (20 mg total) by mouth daily with supper. 90 tablet 1  . sucralfate (CARAFATE) 1 G tablet Take 1 tablet (1 g total) by mouth 4 (four) times daily. 120 tablet 1  . CORTEF 10 MG tablet     . Multiple Vitamins-Minerals (ALIVE WOMENS ENERGY) TABS Take by mouth.     No current facility-administered medications for this visit.   Facility-Administered Medications Ordered in Other Visits  Medication Dose Route Frequency Provider Last Rate Last Dose  . 0.9 %  sodium chloride infusion   Intravenous Continuous Leslie F Herring, NP 20 mL/hr at 12/27/15 1111    . heparin lock flush 100 unit/mL  500 Units Intravenous Once Leslie F Herring, NP      . olaratumab (LARTRUVO) 1,110 mg in sodium chloride 0.9 % 139 mL chemo infusion  15 mg/kg Intravenous Once Leslie F Herring, NP      . sodium chloride 0.9 % injection 10 mL  10 mL Intracatheter PRN Melissa C Corcoran, MD      . sodium chloride 0.9 % injection 10 mL  10 mL Intracatheter PRN Melissa C Corcoran, MD   10 mL at 06/28/15 1045  . sodium chloride 0.9 % injection 10 mL  10 mL Intravenous PRN Leslie F Herring, NP   10 mL at 12/27/15 1110    OBJECTIVE: BP 123/87 mmHg  Pulse 96  Temp(Src) 96.5 F (35.8 C) (Oral)  Resp 18  Wt 163 lb 9.3 oz (74.2 kg)   Body mass index is 28.06 kg/(m^2).    ECOG FS:1 - Symptomatic but  completely ambulatory  General: Well-developed, well-nourished, no acute distress. Eyes: Pink conjunctiva, anicteric sclera. HEENT: Normocephalic, moist mucous membranes, clear oropharnyx. Lungs: Clear to auscultation bilaterally. Heart: Regular rate and rhythm. No rubs, murmurs, or gallops. Abdomen: Soft, nontender, nondistended. No organomegaly noted, normoactive bowel sounds.  Musculoskeletal: No edema, cyanosis, or clubbing. Neuro: Alert, answering all questions appropriately. Cranial nerves grossly intact. Skin: No rashes or petechiae noted. Psych: Normal affect. Lymphatics: No cervical, clavicular LAD.   LAB RESULTS: [facility] testing performed and results to be scanned in.  STUDIES: Ct Chest Wo Contrast  12/25/2015  CLINICAL DATA:  Metastatic uterine leiomyosarcoma.  On chemotherapy. EXAM: CT CHEST WITHOUT CONTRAST TECHNIQUE: Multidetector CT imaging of the chest was performed following the standard protocol without IV contrast. COMPARISON:    10/22/2015. FINDINGS: Mediastinum/Nodes: Left subclavian Port-A-Cath terminates in the right atrium. No pathologically enlarged mediastinal or axillary lymph nodes. Hilar regions are difficult to definitively evaluate without IV contrast. Heart size normal. No pericardial effusion. Lungs/Pleura: Image quality is somewhat degraded by respiratory motion. Dominant soft tissue lesion along the anterior left hemi thorax measures approximately 2.2 x 4.3 cm (image 18), likely stable. Slight increased in size of some pulmonary nodules. Index nodule in the anterior left upper lobe measures 11 mm (image 15), previously 5 mm. A second index lesion in the right lower lobe measures 11 mm (image 30), previously 8 mm. No pleural fluid. Airway is unremarkable. Upper abdomen: Visualized portions of the liver, adrenal glands, kidneys, spleen, pancreas, stomach and bowel are grossly unremarkable. Cholecystectomy. Musculoskeletal: No worrisome lytic or sclerotic lesions.  Mild degenerative changes are seen in the spine. IMPRESSION: Slight progression in pulmonary metastatic disease. Electronically Signed   By: Lorin Picket M.D.   On: 12/25/2015 10:07   Nm Cardiac Muga Rest  12/25/2015  CLINICAL DATA:  Uterine leiomyosarcoma, chemotherapy with anthracycline EXAM: NUCLEAR MEDICINE CARDIAC BLOOD POOL IMAGING (MUGA) TECHNIQUE: Cardiac multi-gated acquisition was performed at rest following intravenous injection of Tc-70mlabeled red blood cells. RADIOPHARMACEUTICALS:  20.302 mCi Tc-919mn-vitro labeled autologous red blood cells IV COMPARISON:  None ; correlation is made with the report from an echocardiogram of 10/22/2015 FINDINGS: Calculated LEFT ventricular ejection fraction is 52%. Imaging was obtained at a cardiac rate of 83 bpm. Patient was rhythmic during imaging. Cine analysis of the gated blood pool in 3 projections demonstrates normal LEFT ventricular wall motion. IMPRESSION: Normal LEFT ventricular ejection fraction 52%. Normal LV wall motion. LEFT ventricular ejection fraction has decreased from the 65% calculated on the most recent echocardiogram. Electronically Signed   By: MaLavonia Dana.D.   On: 12/25/2015 12:07    ASSESSMENT:  Metastatic Uterine Leiomyosarcoma.  PLAN:   1. Uterine Leiomyosarcoma. She underwent TAH/BSO on 01/30/2015. Pathology confirmed high grade leiomyosarcoma with 2 large adjacent tumor nodules (10 cm and 5 cm) and a separate 0.5 cm serosal nodule.Also with right iliac node, left mammary chain adenopathy, and scattered nodules throughout the lungs. She has a fraternal twin who developed breast cancer at age 3346Several other family members have malignancies. 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  She is status post 8 cycles of adriamycin (06/07/2015 - 12/06/2015) with Neulasta (On-Pro) support. Cycle #3 was complicated by an acute left lower  extremity DVT. Cycle #6 was postponed a week secondary to [place]ropenia [place]). Zinecard was added with cycle #7. Olaratumab (LRichardo Hankswas added with cycle #8.  MUGA scan on 12/25/2015 was reported as having a decrease in EF from 65% to 52%. Therefore decision made to hold Adriamycin and Zinecard. Today she will receive Cycle 2 of Lartruvo only. She will return in 1 week for her next treatment.    Patient expressed understanding and was in agreement with this plan. She also understands that She can call clinic at any time with any questions, concerns, or complaints.   Dr. FiGrayland Ormondas available for consultation and review of plan of care for this patient.  LeEvlyn KannerNP   12/27/2015 11:24 AM

## 2016-01-01 ENCOUNTER — Ambulatory Visit: Payer: 59 | Admitting: Hematology and Oncology

## 2016-01-01 ENCOUNTER — Ambulatory Visit: Payer: 59

## 2016-01-03 ENCOUNTER — Encounter: Payer: Self-pay | Admitting: Hematology and Oncology

## 2016-01-04 ENCOUNTER — Inpatient Hospital Stay: Payer: 59 | Attending: Hematology and Oncology | Admitting: Hematology and Oncology

## 2016-01-04 ENCOUNTER — Inpatient Hospital Stay: Payer: 59

## 2016-01-04 ENCOUNTER — Other Ambulatory Visit: Payer: Self-pay | Admitting: Hematology and Oncology

## 2016-01-04 VITALS — BP 122/81 | HR 99 | Temp 97.7°F | Resp 18 | Ht 64.0 in | Wt 164.5 lb

## 2016-01-04 DIAGNOSIS — R599 Enlarged lymph nodes, unspecified: Secondary | ICD-10-CM | POA: Insufficient documentation

## 2016-01-04 DIAGNOSIS — Z79899 Other long term (current) drug therapy: Secondary | ICD-10-CM | POA: Diagnosis not present

## 2016-01-04 DIAGNOSIS — C55 Malignant neoplasm of uterus, part unspecified: Secondary | ICD-10-CM

## 2016-01-04 DIAGNOSIS — C7802 Secondary malignant neoplasm of left lung: Secondary | ICD-10-CM | POA: Diagnosis not present

## 2016-01-04 DIAGNOSIS — Z9221 Personal history of antineoplastic chemotherapy: Secondary | ICD-10-CM | POA: Diagnosis not present

## 2016-01-04 DIAGNOSIS — D72819 Decreased white blood cell count, unspecified: Secondary | ICD-10-CM | POA: Insufficient documentation

## 2016-01-04 DIAGNOSIS — C782 Secondary malignant neoplasm of pleura: Secondary | ICD-10-CM | POA: Diagnosis not present

## 2016-01-04 DIAGNOSIS — I825Z2 Chronic embolism and thrombosis of unspecified deep veins of left distal lower extremity: Secondary | ICD-10-CM

## 2016-01-04 DIAGNOSIS — C7801 Secondary malignant neoplasm of right lung: Secondary | ICD-10-CM

## 2016-01-04 MED ORDER — DIPHENHYDRAMINE HCL 50 MG/ML IJ SOLN
50.0000 mg | Freq: Once | INTRAMUSCULAR | Status: AC
  Administered 2016-01-04: 50 mg via INTRAVENOUS
  Filled 2016-01-04: qty 1

## 2016-01-04 MED ORDER — HEPARIN SOD (PORK) LOCK FLUSH 100 UNIT/ML IV SOLN
500.0000 [IU] | Freq: Once | INTRAVENOUS | Status: AC | PRN
  Administered 2016-01-04: 500 [IU]
  Filled 2016-01-04: qty 5

## 2016-01-04 MED ORDER — SODIUM CHLORIDE 0.9 % IV SOLN
Freq: Once | INTRAVENOUS | Status: AC
  Administered 2016-01-04: 11:00:00 via INTRAVENOUS
  Filled 2016-01-04: qty 1000

## 2016-01-04 MED ORDER — SODIUM CHLORIDE 0.9 % IJ SOLN
10.0000 mL | INTRAMUSCULAR | Status: DC | PRN
  Administered 2016-01-04: 10 mL
  Filled 2016-01-04: qty 10

## 2016-01-04 MED ORDER — OLARATUMAB CHEMO INJECTION 500 MG/50ML
1120.0000 mg | Freq: Once | INTRAVENOUS | Status: AC
  Administered 2016-01-04: 1120 mg via INTRAVENOUS
  Filled 2016-01-04: qty 112

## 2016-01-04 MED ORDER — DEXAMETHASONE SODIUM PHOSPHATE 100 MG/10ML IJ SOLN
20.0000 mg | Freq: Once | INTRAMUSCULAR | Status: AC
  Administered 2016-01-04: 20 mg via INTRAVENOUS
  Filled 2016-01-04: qty 2

## 2016-01-04 NOTE — Progress Notes (Signed)
[facility] day:  01/04/2016   Chief Complaint: Cassidy Bradley is an 55 y.o. female with metastatic uterine leiomyosarcoma who is seen for assessment prior to day 8 of cycle #1 olaratumab [name].  HPI: The patient was last seen in the medical oncology clinic by me on 12/13/2015.  At that time, she received day 8 of cycle #2 olaratumab.  She was then seen by Georgeanne Nim, NP, on 12/27/2015.  At that time, restaging studies and echocardiogram were reviewed.  She received Lartruvo alone.  During the interim, she has felt great.  She has been "perked up".  She notes a little tenderness of her left lateral thigh.  She denies any trauma or increased swelling.    Past Medical History  Diagnosis Date  . Breast lump 2006     both breast   . Chest wall mass   . Cancer ([place])   . Uterine cancer [facility])     Past Surgical History  Procedure Laterality Date  . Cholecystectomy  2001  . Ankle surgery  2005  . Cesarean section  1993  . Back surgery  2005  . Breast biopsy Bilateral   . Colonoscopy    . Abdominal hysterectomy  01/30/15   Family History  Problem Relation Age of Onset  . Breast cancer Sister 67  . Breast cancer Maternal Aunt   . Breast cancer Maternal Grandmother 60  . Stomach cancer Maternal Aunt   . Lung cancer Maternal Grandfather   . Colon polyps Mother   . Cancer - Colon Cousin 39    first cousin  . Lupus Father   . Esophageal cancer Maternal Uncle     Social History:  reports that she has never smoked. She has never used smokeless tobacco. She reports that she does not drink alcohol or use illicit drugs.   The patient is alone today.  Allergies:  Allergies  Allergen Reactions  . Pravastatin Hives and Nausea And Vomiting  . Sulfa Antibiotics Hives and Swelling  . Latex Rash  . Other Rash and Other (See Comments)    Pt states that she is allergic to spandex.   Current Medications: Current Outpatient Prescriptions   Medication Sig Dispense Refill  . CORTEF 10 MG tablet     . lansoprazole (PREVACID) 30 MG capsule Take 30 mg by mouth as needed.    . lidocaine-prilocaine (EMLA) cream Apply 1 application topically as needed. Apply one hour prior to chemotherapy (Patient taking differently: Apply 1 application topically as needed (prior to accessing pts port). Pt applies one hour prior to chemotherapy.) 30 g 1  . loratadine (CLARITIN) 10 MG tablet Take 10 mg by mouth daily.    . magic mouthwash SOLN Take 5 mLs by mouth 3 (three) times daily as needed for mouth pain. Pt swishes and spits out medication. 240 mL 3  . Multiple Vitamins-Minerals (ALIVE WOMENS ENERGY) TABS Take by mouth.    . nystatin (MYCOSTATIN) 100000 UNIT/ML suspension Take 5 mLs (500,000 Units total) by mouth 4 (four) times daily. 100 mL 0  . ondansetron (ZOFRAN) 8 MG tablet Take 8 mg by mouth every 8 (eight) hours as needed for nausea or vomiting.     . rivaroxaban (XARELTO) 20 MG TABS tablet Take 1 tablet (20 mg total) by mouth daily with supper. 90 tablet 1  . sucralfate (CARAFATE) 1 G tablet Take 1 tablet (1 g total) by mouth 4 (four) times daily. 120 tablet 1  No current facility-administered medications for this visit.   Facility-Administered Medications Ordered in Other Visits  Medication Dose Route Frequency Provider Last Rate Last Dose  . sodium chloride 0.9 % injection 10 mL  10 mL Intracatheter PRN Lequita Asal, MD       Review of Systems:  GENERAL:  Feels great.  No fevers or sweats.  Weight up 1 pound. PERFORMANCE STATUS (ECOG):  1-2 HEENT:  No visual changes, runny nose, sore throat, mouth sores or tenderness. Lungs: No shortness of breath or cough.  No hemoptysis. Cardiac:  No chest pain, palpitations, orthopnea, or PND. GI:  Reflux. No nausea, vomiting, diarrhea, constipation, melena, or hematochezia. GU:  No urgency, frequency, dysuria, or hematuria. Musculoskeletal:  Little left thigh muscle pain.  No back pain.   No joint pain.  No muscle tenderness. Extremities: Left lower extremity edema (chronic).  No bone pain. Skin:  No rashes or skin changes. Neuro:  No headache, numbness or weakness, balance or coordination issues. Endocrine:  No diabetes, thyroid issues, hot flashes or night sweats. Psych:  No mood changes, depression or anxiety. Pain:  No focal pain. Review of systems:  All other systems reviewed and found to be negative.                        Physical Exam: Blood pressure 122/81, pulse 99, temperature 97.7 F (36.5 C), temperature source Tympanic, resp. rate 18, height _0  (1.626 m), weight 164 lb 7.4 oz (74.6 kg).  GENERAL:  Well developed, well nourished, sitting comfortably in the exam room in no acute distress. MENTAL STATUS:  Alert and oriented to person, place and time. HEAD:  Wearing a black cap.  Alopecia totalis.  Normocephalic, atraumatic, face symmetric, no Cushingoid features. EYES:  Glasses.  Brown eyes.  Pupils equal round and reactive to light and accomodation.  No conjunctivitis or scleral icterus. ENT:  Oropharynx clear without lesion.  Tongue normal. Mucous membranes moist.  RESPIRATORY:  Clear to auscultation without rales, wheezes or rhonchi. CARDIOVASCULAR:  Regular rate and rhythm without murmur, rub or gallop. No JVD. ABDOMEN:  Soft, non-tender, with active bowel sounds, and no hepatosplenomegaly.  No masses. SKIN:  No rashes, ulcers or lesions. EXTREMITIES:  Mild left lower extremity edema (stable).  Left lateral thigh with small focal area laterally, slightly tender in muscle.  No erythema or increased warmth.  No skin discoloration.  No erythema. LYMPH NODES: No palpable cervical, supraclavicular, axillary or inguinal adenopathy  NEUROLOGICAL: Unremarkable. PSYCH:  Appropriate.  Laboratory testing: [facility] labs on 01/03/2016 included a hematocrit 35.3, hemoglobin 11.6, platelets 309,000, white count 3500 with an ANC of 2300. Comprehensive metabolic panel  included a creatinine of 0.62. Liver function tests were normal.  Magnesium was 1.8.  Phosphorus was 3.7.  No visits with results within 3 Day(s) from this visit. Latest known visit with results is:  Hospital Outpatient Visit on 10/22/2015  Component Date Value Ref Range Status  . Creatinine, Ser 10/22/2015 0.60  0.44 - 1.00 mg/dL Final   Assessment:  Cassidy Bradley is a 55 y.o. female 55 y.o. female with stage IV uterine leiomyosarcoma. She underwent TAH/BSO on 01/30/2015. Pathology confirmed high grade leiomyosarcoma with 2 large adjacent tumor nodules (10 cm and 5 cm) and a separate 0.5 cm serosal nodule.   Chest, abdomen, and pelvic CT scan on 02/12/2015 revealed a 1.7 x 2.3 cm right external iliac node. Pulmonary nodules were scattered throughout the lungs bilaterally. There was  left internal mammary chain adenopathy measuring up to 3.6 x 2 cm.  She underwent anterior chest wall pleural-based CT guided biopsy on 02/21/2015. Pathology was consistent with metastatic leiomyosarcoma. Bone scan on 02/28/2015 revealed no evidence of metastatic disease with indeterminate uptake in the left posterior rib.   She has a fraternal twin who developed breast cancer at age 25. Several other family members have malignancies. 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  Prechemotherapy labs from [facility] revealed leukopenia (WBC 2300 with ANC 900). Work-up to date is negative. She has a family history of leukopenia. Bone marrow on 03/08/2015 revealed no evidence of neoplasia or metastatic disease. Marrow was normocellular for age (30-50%) with trilineage hematopoiesis. There was no increase in marrow reticulin fibers. Flow cytometry and cytogenetics were normal.  She received 4 cycles of gemcitabine and Taxotere (03/20/2015 - 05/22/2015) with Neulasta support. She received gemcitabine alone on 05/22/2015.  She was  admitted on 05/24/2015 with an acute febrile illness. All cultures were negative.   Chest CT on 05/25/2015 revealed early progressive disease. Abdominal and pelvic CT scan on 05/30/2015 revealed stable to slightly increased right external iliac lymph node (1.8 x 2.5 cm).   Echocardiogram on 06/18/2015 revealed EF of 60-65%.  Echo on 10/22/2015 revealed an EF of 65%.  MUGA on 12/25/2015 revealed an EF of 52%.  Bilateral lower extremity duplex on 05/30/2015 was negative.  Left upper extremity duplex on 06/08/2015 was negative.  Left lower extremity duplex on 07/19/2015 documented a DVT.  She is on Xarelto.   She received 8 cycles of adriamycin (06/07/2015 - 12/06/2015) with Neulasta (On-Pro) support.  Cycle #3 was complicated by an acute  left lower extremity DVT.  Cycle #6 was postponed a week secondary to neutropenia ([place]).  Zinecard was added with cycle #7.  Olaratumab [name] was added with cycle #8.  Chest, abdomen, and pelvic CT scan on 08/07/2015 revealed a partial treatment response with decrease anterior left upper pleural metastasis, stable to decreased pulmonary metastases and decreased right external iliac adenopathy. There were no new sites of disease.  Chest, abdomen, and pelvic CT scan on 10/22/2015 revealed mild decrease in left anterior chest wall mass/internal mammary lymphadenopathy (3.1 x 4.6 cm to 2.5 x 4.4 cm).  There was decreased mild right external iliac lymphadenopathy (1.4 cm to 1.2 cm).  There was diffuse stable bilateral pulmonary metastases (largest 1.1 cm in anterior right lung).  There was no new or progressive metastatic disease.  Chest CT on 12/25/2015 on revealed slight progression in pulmonary nodules (index nodules: 5 mm to 11 mm and 8 mm to 11 mm).  The dominant soft tissue lesion along the left hemi-thorax was 2.2 x 4.3 cm (stable).  She is currently day 8 of cycle #2 Olaratumab [name] (12/06/2015 - 12/27/2015).  She is tolerating her infusions  well.  Symptomatically, she feels good.  Exam is stable.  Plan: 1.  Review interval scans and MUGA.  Review [facility] labs. 2.  Discuss plan for 2 months of therapy after last image (3 cycles) then reimaging.  This is cycle #1.  Discuss plan for Yondelis (Trabectedin) at progression.  Discuss clinical trials at [facility]. 3.  Day 8 of cycle #2 Lartruvo today. 4.  Continue Xarelto. 5.  RTC in 2 weeks for MD assess, review of [facility] labs, and day 1 of cycle #3 Lartruvo.   Lequita Asal, MD 01/04/2016, 10:30 AM

## 2016-01-05 ENCOUNTER — Encounter: Payer: Self-pay | Admitting: Hematology and Oncology

## 2016-01-17 ENCOUNTER — Encounter: Payer: Self-pay | Admitting: Hematology and Oncology

## 2016-01-18 ENCOUNTER — Inpatient Hospital Stay (HOSPITAL_BASED_OUTPATIENT_CLINIC_OR_DEPARTMENT_OTHER): Payer: 59 | Admitting: Hematology and Oncology

## 2016-01-18 ENCOUNTER — Encounter: Payer: Self-pay | Admitting: Hematology and Oncology

## 2016-01-18 ENCOUNTER — Ambulatory Visit
Admission: RE | Admit: 2016-01-18 | Discharge: 2016-01-18 | Disposition: A | Discharge status: Home / Self Care | Payer: 59 | Source: Ambulatory Visit | Attending: Hematology and Oncology | Admitting: Hematology and Oncology

## 2016-01-18 ENCOUNTER — Inpatient Hospital Stay: Payer: 59

## 2016-01-18 ENCOUNTER — Other Ambulatory Visit: Payer: Self-pay | Admitting: Hematology and Oncology

## 2016-01-18 VITALS — BP 137/93 | HR 93 | Temp 97.2°F | Resp 18 | Ht 64.0 in | Wt 169.1 lb

## 2016-01-18 DIAGNOSIS — C55 Malignant neoplasm of uterus, part unspecified: Secondary | ICD-10-CM

## 2016-01-18 DIAGNOSIS — D72819 Decreased white blood cell count, unspecified: Secondary | ICD-10-CM

## 2016-01-18 DIAGNOSIS — C7802 Secondary malignant neoplasm of left lung: Secondary | ICD-10-CM | POA: Diagnosis not present

## 2016-01-18 DIAGNOSIS — R599 Enlarged lymph nodes, unspecified: Secondary | ICD-10-CM

## 2016-01-18 DIAGNOSIS — C7801 Secondary malignant neoplasm of right lung: Secondary | ICD-10-CM | POA: Diagnosis not present

## 2016-01-18 DIAGNOSIS — Z9221 Personal history of antineoplastic chemotherapy: Secondary | ICD-10-CM

## 2016-01-18 DIAGNOSIS — C782 Secondary malignant neoplasm of pleura: Secondary | ICD-10-CM | POA: Diagnosis not present

## 2016-01-18 DIAGNOSIS — M898X5 Other specified disorders of bone, thigh: Secondary | ICD-10-CM

## 2016-01-18 DIAGNOSIS — I825Z2 Chronic embolism and thrombosis of unspecified deep veins of left distal lower extremity: Secondary | ICD-10-CM

## 2016-01-18 DIAGNOSIS — Z79899 Other long term (current) drug therapy: Secondary | ICD-10-CM

## 2016-01-18 MED ORDER — HEPARIN SOD (PORK) LOCK FLUSH 100 UNIT/ML IV SOLN
500.0000 [IU] | Freq: Once | INTRAVENOUS | Status: AC | PRN
  Administered 2016-01-18: 500 [IU]
  Filled 2016-01-18: qty 5

## 2016-01-18 MED ORDER — SODIUM CHLORIDE 0.9 % IJ SOLN
10.0000 mL | INTRAMUSCULAR | Status: DC | PRN
  Administered 2016-01-18: 10 mL
  Filled 2016-01-18: qty 10

## 2016-01-18 MED ORDER — DIPHENHYDRAMINE HCL 50 MG/ML IJ SOLN
50.0000 mg | Freq: Once | INTRAMUSCULAR | Status: AC
  Administered 2016-01-18: 50 mg via INTRAVENOUS
  Filled 2016-01-18: qty 1

## 2016-01-18 MED ORDER — SODIUM CHLORIDE 0.9 % IV SOLN
Freq: Once | INTRAVENOUS | Status: AC
  Administered 2016-01-18: 10:00:00 via INTRAVENOUS
  Filled 2016-01-18: qty 1000

## 2016-01-18 MED ORDER — OLARATUMAB CHEMO INJECTION 500 MG/50ML
15.0000 mg/kg | Freq: Once | INTRAVENOUS | Status: AC
  Administered 2016-01-18: 1110 mg via INTRAVENOUS
  Filled 2016-01-18: qty 111

## 2016-01-18 MED ORDER — SODIUM CHLORIDE 0.9 % IV SOLN
20.0000 mg | Freq: Once | INTRAVENOUS | Status: AC
  Administered 2016-01-18: 20 mg via INTRAVENOUS
  Filled 2016-01-18: qty 2

## 2016-01-18 NOTE — Progress Notes (Signed)
Cassidy Bradley is an 55 y.o. female with metastatic uterine leiomyosarcoma who is seen for assessment prior to day 1 of cycle #2 single agent olaratumab [name].  HPI: The patient was last seen in the medical oncology clinic by me on 01/04/2016.  At that time, she received day 8 of cycle #1 single agent olaratumab.  She felt great.  She noted a little tenderness of her left lateral thigh.  She denied any trauma or increased swelling.  She received her infusion uneventfully.  During the interim, she has done well.  She notes that she is eating too much.  She likes chocolate.  She has gained 5 pounds.  She is doing small tasks around the house.  She continues to note a little discomfort in her left lateral leg.   Past Medical History  Diagnosis Date  . Breast lump 2006     both breast   . Chest wall mass   . Cancer ([place])   . Uterine cancer [facility])     Past Surgical History  Procedure Laterality Date  . Cholecystectomy  2001  . Ankle surgery  2005  . Cesarean section  1993  . Back surgery  2005  . Breast biopsy Bilateral   . Colonoscopy    . Abdominal hysterectomy  01/30/15   Family History  Problem Relation Age of Onset  . Breast cancer Sister 48  . Breast cancer Maternal Aunt   . Breast cancer Maternal Grandmother 60  . Stomach cancer Maternal Aunt   . Lung cancer Maternal Grandfather   . Colon polyps Mother   . Cancer - Colon Cousin 35    first cousin  . Lupus Father   . Esophageal cancer Maternal Uncle     Social History:  reports that she has never smoked. She has never used smokeless tobacco. She reports that she does not drink alcohol or use illicit drugs.   The patient is alone today.  Allergies:  Allergies  Allergen Reactions  . Pravastatin Hives and Nausea And Vomiting  . Sulfa Antibiotics Hives and Swelling  . Latex Rash  . Other Rash and Other (See Comments)     Pt states that she is allergic to spandex.   Current Medications: Current Outpatient Prescriptions  Medication Sig Dispense Refill  . CORTEF 10 MG tablet     . lansoprazole (PREVACID) 30 MG capsule Take 30 mg by mouth as needed.    . lidocaine-prilocaine (EMLA) cream Apply 1 application topically as needed. Apply one hour prior to chemotherapy (Patient taking differently: Apply 1 application topically as needed (prior to accessing pts port). Pt applies one hour prior to chemotherapy.) 30 g 1  . loratadine (CLARITIN) 10 MG tablet Take 10 mg by mouth daily.    . magic mouthwash SOLN Take 5 mLs by mouth 3 (three) times daily as needed for mouth pain. Pt swishes and spits out medication. 240 mL 3  . Multiple Vitamins-Minerals (ALIVE WOMENS ENERGY) TABS Take by mouth.    . nystatin (MYCOSTATIN) 100000 UNIT/ML suspension Take 5 mLs (500,000 Units total) by mouth 4 (four) times daily. 100 mL 0  . ondansetron (ZOFRAN) 8 MG tablet Take 8 mg by mouth every 8 (eight) hours as needed for nausea or vomiting.     . rivaroxaban (XARELTO) 20 MG TABS tablet Take 1 tablet (20 mg total) by mouth daily with supper. 90 tablet 1  .  sucralfate (CARAFATE) 1 G tablet Take 1 tablet (1 g total) by mouth 4 (four) times daily. 120 tablet 1   No current facility-administered medications for this visit.   Facility-Administered Medications Ordered in Other Visits  Medication Dose Route Frequency Provider Last Rate Last Dose  . sodium chloride 0.9 % injection 10 mL  10 mL Intracatheter PRN Rosey Bath, MD       Review of Systems:  GENERAL:  Feels better off adriamycin.  No fevers or sweats.  Weight up 5 pound. PERFORMANCE STATUS (ECOG):  1-2 HEENT:  No visual changes, runny nose, sore throat, mouth sores or tenderness. Lungs: No shortness of breath or cough.  No hemoptysis. Cardiac:  No chest pain, palpitations, orthopnea, or PND. GI:  Reflux. No nausea, vomiting, diarrhea, constipation, melena, or  hematochezia. GU:  No urgency, frequency, dysuria, or hematuria. Musculoskeletal:  Little left thigh muscle pain, persists.  No back pain.  No joint pain.  No muscle tenderness. Extremities: Left lower extremity edema (chronic).  No bone pain. Skin:  No rashes or skin changes. Neuro:  No headache, numbness or weakness, balance or coordination issues. Endocrine:  No diabetes, thyroid issues, hot flashes or night sweats. Psych:  No mood changes, depression or anxiety. Pain:  No focal pain. Review of systems:  All other systems reviewed and found to be negative.                        Physical Exam: Blood pressure 137/93, pulse 93, temperature 97.2 F (36.2 C), temperature source Tympanic, resp. rate 18, height 5\' 4"  (1.626 m), weight 169 lb 1.5 oz (76.7 kg).  GENERAL:  Well developed, well nourished, sitting comfortably in the exam room in no acute distress. MENTAL STATUS:  Alert and oriented to person, place and time. HEAD:  Wearing a black wrap.  Alopecia totalis.  Normocephalic, atraumatic, face symmetric, no Cushingoid features. EYES:  Glasses.  Brown eyes.  Pupils equal round and reactive to light and accomodation.  No conjunctivitis or scleral icterus. ENT:  Oropharynx clear without lesion.  Tongue normal. Mucous membranes moist.  RESPIRATORY:  Clear to auscultation without rales, wheezes or rhonchi. CARDIOVASCULAR:  Regular rate and rhythm without murmur, rub or gallop. No JVD. ABDOMEN:  Soft, non-tender, with active bowel sounds, and no hepatosplenomegaly.  No masses. SKIN:  No rashes, ulcers or lesions. EXTREMITIES:  Mild left lower extremity edema (stable).   LYMPH NODES: No palpable cervical, supraclavicular, axillary or inguinal adenopathy  NEUROLOGICAL: Unremarkable. PSYCH:  Appropriate.  Laboratory testing: [facility] labs on 01/17/2016 included a hematocrit 35.9, hemoglobin 12.1, platelets 211,000, white count 3700 with an ANC of 1700. Absolute eosinophil count 900.   Comprehensive metabolic panel included a creatinine of 0.52. Liver function tests were normal.  Magnesium was 1.8.   No visits with results within 3 Day(s) from this visit. Latest known visit with results is:  Hospital Outpatient Visit on 10/22/2015  Component Date Value Ref Range Status  . Creatinine, Ser 10/22/2015 0.60  0.44 - 1.00 mg/dL Final   Assessment:  Cassidy Bradley is a 55 y.o. female 55 y.o. female with stage IV uterine leiomyosarcoma. She underwent TAH/BSO on 01/30/2015. Pathology confirmed high grade leiomyosarcoma with 2 large adjacent tumor nodules (10 cm and 5 cm) and a separate 0.5 cm serosal nodule.   Chest, abdomen, and pelvic CT scan on 02/12/2015 revealed a 1.7 x 2.3 cm right external iliac node. Pulmonary nodules were scattered throughout the lungs  bilaterally. There was left internal mammary chain adenopathy measuring up to 3.6 x 2 cm.  She underwent anterior chest wall pleural-based CT guided biopsy on 02/21/2015. Pathology was consistent with metastatic leiomyosarcoma. Bone scan on 02/28/2015 revealed no evidence of metastatic disease with indeterminate uptake in the left posterior rib.   She has a fraternal twin who developed breast cancer at age 8. Several other family members have malignancies. 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  Prechemotherapy labs from [facility] revealed leukopenia (WBC 2300 with ANC 900). Work-up to date is negative. She has a family history of leukopenia. Bone marrow on 03/08/2015 revealed no evidence of neoplasia or metastatic disease. Marrow was normocellular for age (30-50%) with trilineage hematopoiesis. There was no increase in marrow reticulin fibers. Flow cytometry and cytogenetics were normal.  She received 4 cycles of gemcitabine and Taxotere (03/20/2015 - 05/22/2015) with Neulasta support. She received gemcitabine alone on 05/22/2015.  She  was admitted on 05/24/2015 with an acute febrile illness. All cultures were negative.   Chest CT on 05/25/2015 revealed early progressive disease. Abdominal and pelvic CT scan on 05/30/2015 revealed stable to slightly increased right external iliac lymph node (1.8 x 2.5 cm).   Echocardiogram on 06/18/2015 revealed EF of 60-65%.  Echo on 10/22/2015 revealed an EF of 65%.  MUGA on 12/25/2015 revealed an EF of 52%.  Bilateral lower extremity duplex on 05/30/2015 was negative.  Left upper extremity duplex on 06/08/2015 was negative.  Left lower extremity duplex on 07/19/2015 documented a DVT.  She is on Xarelto.   She received 8 cycles of adriamycin (06/07/2015 - 12/06/2015) with Neulasta (On-Pro) support.  Cycle #3 was complicated by an acute  left lower extremity DVT.  Cycle #6 was postponed a week secondary to neutropenia ([place] [address]).  Zinecard was added with cycle #7.  Olaratumab [name] was added with cycle #8.  Chest, abdomen, and pelvic CT scan on 08/07/2015 revealed a partial treatment response with decrease anterior left upper pleural metastasis, stable to decreased pulmonary metastases and decreased right external iliac adenopathy. There were no new sites of disease.  Chest, abdomen, and pelvic CT scan on 10/22/2015 revealed mild decrease in left anterior chest wall mass/internal mammary lymphadenopathy (3.1 x 4.6 cm to 2.5 x 4.4 cm).  There was decreased mild right external iliac lymphadenopathy (1.4 cm to 1.2 cm).  There was diffuse stable bilateral pulmonary metastases (largest 1.1 cm in anterior right lung).  There was no new or progressive metastatic disease.  Chest CT on 12/25/2015 on revealed slight progression in pulmonary nodules (index nodules: 5 mm to 11 mm and 8 mm to 11 mm).  The dominant soft tissue lesion along the left hemi-thorax was 2.2 x 4.3 cm (stable).  She is status post 1 cycle of single agent Olaratumab [name] (12/27/2015).  She is tolerating her infusions  well.  Symptomatically, she feels better off of adriamycin. She has gained weight.  She has persistent mild discomfort in her left lateral leg.  Exam is stable.  Plan: 1.  Review [facility] labs. 2.  Begin cycle #2 Lartruvo today. 3.  RTC in 1 week for day 8 of cycle #2 Lartruvo. 4.  Plain films of left femur. 5.  Discuss plan for 1 more cycle of single agent Lartruvo prior to restaging.  Discuss PET scan versus CT scans +/- bone scan if left leg discomfort persists. 6.  Complete [facility] forms. 7.  Continue Xarelto. 8.  RTC in 3  weeks for MD assess, review of [facility] labs, and day 1 of cycle #3 single agent Lartruvo.   Lequita Asal, MD 01/18/2016, 9:40 AM

## 2016-01-24 ENCOUNTER — Encounter: Payer: Self-pay | Admitting: Hematology and Oncology

## 2016-01-25 ENCOUNTER — Inpatient Hospital Stay: Payer: 59

## 2016-01-30 ENCOUNTER — Telehealth: Payer: Self-pay | Admitting: Hematology and Oncology

## 2016-01-30 ENCOUNTER — Telehealth: Payer: Self-pay

## 2016-01-30 NOTE — Telephone Encounter (Signed)
Patient has been really tired lately which is not like her. She just wants to sleep a lot. She also discovered two knots on her lower leg in the calf area and is concerned about this because her leg was feeling funny. Please call her: [phone]. Thanks.

## 2016-01-30 NOTE — Telephone Encounter (Signed)
   We can see her tomorrow. If she needs to be seen today, she can go to urgent care, her PCP, or the ER.  M

## 2016-01-30 NOTE — Telephone Encounter (Signed)
Spoke with pt regarding a message left by pt.  Pt verbalizes feeling something like two knots on back of leg and just plain tired.  Per Dr. Mike Gip pt can see [place] in clinic tomorrow but if she feels worse or feels it is more urgent to go to urgent care, PCP or ER.  Pt verbalized she was not even sure she would need to come in tomorrow and I stated she could always call and cancel.  Pt reported no fevers or other concerning issues.

## 2016-01-31 ENCOUNTER — Ambulatory Visit
Admission: RE | Admit: 2016-01-31 | Discharge: 2016-01-31 | Disposition: A | Discharge status: Home / Self Care | Payer: 59 | Source: Ambulatory Visit | Attending: Hematology and Oncology | Admitting: Hematology and Oncology

## 2016-01-31 ENCOUNTER — Encounter: Payer: Self-pay | Admitting: Hematology and Oncology

## 2016-01-31 ENCOUNTER — Inpatient Hospital Stay: Payer: 59 | Attending: Hematology and Oncology | Admitting: Hematology and Oncology

## 2016-01-31 VITALS — BP 137/97 | HR 98 | Temp 96.7°F | Resp 18 | Ht 64.0 in | Wt 167.8 lb

## 2016-01-31 DIAGNOSIS — R55 Syncope and collapse: Secondary | ICD-10-CM | POA: Insufficient documentation

## 2016-01-31 DIAGNOSIS — Z5111 Encounter for antineoplastic chemotherapy: Secondary | ICD-10-CM | POA: Insufficient documentation

## 2016-01-31 DIAGNOSIS — C7802 Secondary malignant neoplasm of left lung: Secondary | ICD-10-CM

## 2016-01-31 DIAGNOSIS — C7801 Secondary malignant neoplasm of right lung: Secondary | ICD-10-CM

## 2016-01-31 DIAGNOSIS — I82432 Acute embolism and thrombosis of left popliteal vein: Secondary | ICD-10-CM

## 2016-01-31 DIAGNOSIS — C782 Secondary malignant neoplasm of pleura: Secondary | ICD-10-CM | POA: Diagnosis not present

## 2016-01-31 DIAGNOSIS — Z86718 Personal history of other venous thrombosis and embolism: Secondary | ICD-10-CM | POA: Insufficient documentation

## 2016-01-31 DIAGNOSIS — D709 Neutropenia, unspecified: Secondary | ICD-10-CM | POA: Diagnosis not present

## 2016-01-31 DIAGNOSIS — C55 Malignant neoplasm of uterus, part unspecified: Secondary | ICD-10-CM | POA: Insufficient documentation

## 2016-01-31 DIAGNOSIS — Z79899 Other long term (current) drug therapy: Secondary | ICD-10-CM | POA: Diagnosis not present

## 2016-01-31 DIAGNOSIS — M79605 Pain in left leg: Secondary | ICD-10-CM

## 2016-01-31 DIAGNOSIS — R42 Dizziness and giddiness: Secondary | ICD-10-CM

## 2016-01-31 DIAGNOSIS — Z9221 Personal history of antineoplastic chemotherapy: Secondary | ICD-10-CM

## 2016-01-31 NOTE — Progress Notes (Signed)
Pt added on today for complaints of weakness in general moderate fatigue.  On 1/27 pt reports she was walking around in [employer] and felt as if she was going to pass out.  Pt reports eating a sample of food and feeling better.  Pt continues to have dizziness that comes and goes with movement.  Pt reports feeling cold a lot.  Pt reports appetite has decreased and really makes herself eat right now.  Pt currently has nasal drainage that is clear in color.  Pt reports having 2 knot on left left calf area with pain 4/10.  On assessment touching calf reports no pain to touch.

## 2016-01-31 NOTE — Progress Notes (Signed)
Cassidy Bradley day:  01/31/2016   Chief Complaint: Cassidy Bradley is an 55 y.o. female with metastatic uterine leiomyosarcoma who is seen for sick call visit on day 14 of cycle #2 single agent olaratumab [name].  HPI: The patient was last seen in the medical oncology Bradley by me on 01/18/2016.  At that time, she received day 1of cycle #2 single agent olaratumab.  She was doing well.  She had gained 5 pounds.  She was doing small tasks around the house.  She noted a little discomfort in her left lateral leg.  Exam was unremarkable.  Plain films of the left femur were unremarkable.  She received her treatment on 01/18/2016.  Day 8 olaratumab was held secondary to neutropenia.  During the interim, she has felt fatigued.  Appetite has decreased.  She has lost 2 pounds.  She notes episodes of presyncope (1st at [employer] on 01/25/2016 at noon).  She describes being light headed/dizzy.  She ate a sample of food and felt better.  Yesterday, she had another episode and felt better after eating a honey bun (? symptomatic hypoglycemia).  She is drinking plain water and coffee.    She notes 2 spots/knots on the back of her left calf.  She has had cramping pain in this area.  She is on Xarelto.  She notes ongoing pain in her left lateral femur.  Area remains tender to touch.    She notes feeling cold.  She denies any fever.  She has had clear sinus drainage.  She denies any cough, shortness of breath, GI or GU symptoms.  Her chest overall feels better (less pain).  Past Medical History  Diagnosis Date  . Breast lump 2006     both breast   . Chest wall mass   . Cancer ([place])   . Uterine cancer [facility])     Past Surgical History  Procedure Laterality Date  . Cholecystectomy  2001  . Ankle surgery  2005  . Cesarean section  1993  . Back surgery  2005  . Breast biopsy Bilateral   . Colonoscopy    . Abdominal hysterectomy  01/30/15   Family History  Problem  Relation Age of Onset  . Breast cancer Sister 78  . Breast cancer Maternal Aunt   . Breast cancer Maternal Grandmother 60  . Stomach cancer Maternal Aunt   . Lung cancer Maternal Grandfather   . Colon polyps Mother   . Cancer - Colon Cousin 76    first cousin  . Lupus Father   . Esophageal cancer Maternal Uncle     Social History:  reports that she has never smoked. She has never used smokeless tobacco. She reports that she does not drink alcohol or use illicit drugs.   The patient is alone today.  Allergies:  Allergies  Allergen Reactions  . Pravastatin Hives and Nausea And Vomiting  . Sulfa Antibiotics Hives and Swelling  . Latex Rash  . Other Rash and Other (See Comments)    Pt states that she is allergic to spandex.   Current Medications: Current Outpatient Prescriptions  Medication Sig Dispense Refill  . lidocaine-prilocaine (EMLA) cream Apply 1 application topically as needed. Apply one hour prior to chemotherapy (Patient taking differently: Apply 1 application topically as needed (prior to accessing pts port). Pt applies one hour prior to chemotherapy.) 30 g 1  . loratadine (CLARITIN) 10 MG tablet Take 10 mg by mouth daily.    Marland Kitchen  Multiple Vitamins-Minerals (ALIVE WOMENS ENERGY) TABS Take by mouth.    . nystatin (MYCOSTATIN) 100000 UNIT/ML suspension Take 5 mLs (500,000 Units total) by mouth 4 (four) times daily. 100 mL 0  . ondansetron (ZOFRAN) 8 MG tablet Take 8 mg by mouth every 8 (eight) hours as needed for nausea or vomiting.     . rivaroxaban (XARELTO) 20 MG TABS tablet Take 1 tablet (20 mg total) by mouth daily with supper. 90 tablet 1  . lansoprazole (PREVACID) 30 MG capsule Take 30 mg by mouth as needed. Reported on 01/31/2016    . magic mouthwash SOLN Take 5 mLs by mouth 3 (three) times daily as needed for mouth pain. Pt swishes and spits out medication. (Patient not taking: Reported on 01/31/2016) 240 mL 3  . sucralfate (CARAFATE) 1 G tablet Take 1 tablet (1 g total) by  mouth 4 (four) times daily. (Patient not taking: Reported on 01/31/2016) 120 tablet 1   No current facility-administered medications for this visit.   Facility-Administered Medications Ordered in Other Visits  Medication Dose Route Frequency Provider Last Rate Last Dose  . sodium chloride 0.9 % injection 10 mL  10 mL Intracatheter PRN Lequita Asal, MD        Review of Systems:  GENERAL:  Fatigue.  No fevers or sweats.  Weight loss 2 pounds. PERFORMANCE STATUS (ECOG):  2 HEENT:  Runny nose.  No visual changes, sore throat, mouth sores or tenderness. Lungs:  Chest pain less.  No shortness of breath or cough.  No hemoptysis. Cardiac:  No chest pain, palpitations, orthopnea, or PND. GI:  Reflux. No nausea, vomiting, diarrhea, constipation, melena, or hematochezia. GU:  No urgency, frequency, dysuria, or hematuria. Musculoskeletal:  Persistent left lateral thigh pain.  No back pain.  No joint pain.  No muscle tenderness. Extremities: Left lower extremity edema (chronic).  Knots and cramping in left lower extremity.  Skin:  No rashes or skin changes. Neuro:  No headache, numbness or weakness, balance or coordination issues. Endocrine:  Possible episodes of hypoglycemia.  No diabetes, thyroid issues, hot flashes or night sweats. Psych:  No mood changes, depression or anxiety. Pain:  No focal pain. Review of systems:  All other systems reviewed and found to be negative.                        Physical Exam: Blood pressure 137/97, pulse 98, temperature 96.7 F (35.9 C), temperature source Tympanic, resp. rate 18, height _0  (1.626 m), weight 167 lb 12.3 oz (76.1 kg), SpO2 100 %.  GENERAL:  Well developed, well nourished, sitting comfortably in the exam room in no acute distress. MENTAL STATUS:  Alert and oriented to person, place and time. HEAD:  Wearing a black wrap.  Alopecia totalis.  Normocephalic, atraumatic, face symmetric, no Cushingoid features. EYES:  Glasses.  Brown eyes.   Pupils equal round and reactive to light and accomodation.  No conjunctivitis or scleral icterus. ENT:  Oropharynx clear without lesion.  Tongue normal. Mucous membranes moist.  RESPIRATORY:  Clear to auscultation without rales, wheezes or rhonchi. CARDIOVASCULAR:  Regular rate and rhythm without murmur, rub or gallop. No JVD. ABDOMEN:  Soft, non-tender, with active bowel sounds, and no hepatosplenomegaly.  No masses. SKIN:  No rashes, ulcers or lesions. EXTREMITIES:  Mild left lower extremity edema (stable to improved).  Valves palpable in left lower extremity.  Pain on palpation left lateral femur with subtle fullness. LYMPH NODES: No palpable cervical,  supraclavicular, axillary or inguinal adenopathy  NEUROLOGICAL: Cranial nerves II-XII intact.  [place]oor strength and sensation grossing in tact.  Gait normal. PSYCH:  Appropriate.  Laboratory testing: [facility] labs on 01/17/2016 included a hematocrit 35.9, hemoglobin 12.1, platelets 211,000, white count 3700 with an ANC of 1700. Absolute eosinophil count 900.  Comprehensive metabolic panel included a creatinine of 0.52. Liver function tests were normal.  Magnesium was 1.8.   No visits with results within 3 Day(s) from this visit. Latest known visit with results is:  Hospital Outpatient Visit on 10/22/2015  Component Date Value Ref Range Status  . Creatinine, Ser 10/22/2015 0.60  0.44 - 1.00 mg/dL Final   Assessment:  Cassidy Bradley is a 55 y.o. female 55 y.o. female with stage IV uterine leiomyosarcoma. She underwent TAH/BSO on 01/30/2015. Pathology confirmed high grade leiomyosarcoma with 2 large adjacent tumor nodules (10 cm and 5 cm) and a separate 0.5 cm serosal nodule.   Chest, abdomen, and pelvic CT scan on 02/12/2015 revealed a 1.7 x 2.3 cm right external iliac node. Pulmonary nodules were scattered throughout the lungs bilaterally. There was left internal mammary chain adenopathy measuring up to 3.6 x 2 cm.  She underwent anterior  chest wall pleural-based CT guided biopsy on 02/21/2015. Pathology was consistent with metastatic leiomyosarcoma. Bone scan on 02/28/2015 revealed no evidence of metastatic disease with indeterminate uptake in the left posterior rib.   She has a fraternal twin who developed breast cancer at age 74. Several other family members have malignancies. 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  Prechemotherapy labs from [facility] revealed leukopenia (WBC 2300 with ANC 900). Work-up to date is negative. She has a family history of leukopenia. Bone marrow on 03/08/2015 revealed no evidence of neoplasia or metastatic disease. Marrow was normocellular for age (30-50%) with trilineage hematopoiesis. There was no increase in marrow reticulin fibers. Flow cytometry and cytogenetics were normal.  She received 4 cycles of gemcitabine and Taxotere (03/20/2015 - 05/22/2015) with Neulasta support. She received gemcitabine alone on 05/22/2015.  She was admitted on 05/24/2015 with an acute febrile illness. All cultures were negative.   Chest CT on 05/25/2015 revealed early progressive disease. Abdominal and pelvic CT scan on 05/30/2015 revealed stable to slightly increased right external iliac lymph node (1.8 x 2.5 cm).   Echocardiogram on 06/18/2015 revealed EF of 60-65%.  Echo on 10/22/2015 revealed an EF of 65%.  MUGA on 12/25/2015 revealed an EF of 52%.  Bilateral lower extremity duplex on 05/30/2015 was negative.  Left upper extremity duplex on 06/08/2015 was negative.  Left lower extremity duplex on 07/19/2015 documented a DVT.  She is on Xarelto.   She received 8 cycles of adriamycin (06/07/2015 - 12/06/2015) with Neulasta (On-Pro) support.  Cycle #3 was complicated by an acute  left lower extremity DVT.  Cycle #6 was postponed a week secondary to neutropenia ([place] 700).  Zinecard was added with cycle #7.  Olaratumab [name]  was added with cycle #8.  Chest, abdomen, and pelvic CT scan on 08/07/2015 revealed a partial treatment response with decrease anterior left upper pleural metastasis, stable to decreased pulmonary metastases and decreased right external iliac adenopathy. There were no new sites of disease.  Chest, abdomen, and pelvic CT scan on 10/22/2015 revealed mild decrease in left anterior chest wall mass/internal mammary lymphadenopathy (3.1 x 4.6 cm to 2.5 x 4.4 cm).  There was decreased mild right external iliac lymphadenopathy (1.4 cm to 1.2 cm).  There  was diffuse stable bilateral pulmonary metastases (largest 1.1 cm in anterior right lung).  There was no new or progressive metastatic disease.  Chest CT on 12/25/2015 on revealed slight progression in pulmonary nodules (index nodules: 5 mm to 11 mm and 8 mm to 11 mm).  The dominant soft tissue lesion along the left hemi-thorax was 2.2 x 4.3 cm (stable).  She is currently day 14 of cycle #2 single agent olaratumab [name] (12/27/2015 -01/18/2016).  Day 8 of cycle #2 olaratumab was held secondary to neutropenia.  Symptomatically, she has been fatigued.  She has lost 2 pounds.  She has had episodes of pre-syncope improved after eating.  She has been drinking only plain water.  She has persistent pain in her left femur with plain films negative on 01/18/2016.  She has chronic neutropenia preceding treatment.  Plan: 1.  Check orthostatics. 2.  [facility] slip for CBC with diff, CMP, ferritin, iron studies. 3.  Left lower extremity duplex- assess DVT. 4.  Left lower extremity MRI- pain left lateral thigh with negative plain films. 5.  Preauth GCSF for baseline neutropenia affecting ability to receive treatment. 6.  Encourage patient to eat, drink fluids with electrolytes, and have snacks available. 7.  Continue Xarelto. 8.  RTC as previously scheduled.   Lequita Asal, MD 01/31/2016, 9:33 AM

## 2016-02-05 ENCOUNTER — Encounter: Payer: Self-pay | Admitting: Hematology and Oncology

## 2016-02-05 NOTE — Progress Notes (Signed)
[facility] day:  05/22/2015  Chief Complaint: Cassidy Bradley is an 55 y.o. female with stage IV uterine leiomyosarcoma who is seen for assessment prior to cycle #4 gemcitabine and Taxotere.  HPI: The patient was last seen by me in the medical oncology clinic on 04/17/2015.  At that time, she was doing well.  She received day 8 Taxotere and gemcitabine followed by Neulasta the following day.  Nadir counts on 04/23/2015 revealed a hematocrit of 32.6, hemoglobin 10.9, platelets 111,000, WBC 5700 and ANC 3800.  Symptomatically, she is doing okay. Last time she received Taxotere, she was unable to eat. She had no taste. She is drinking Ensure 1 every morning. She is eating the rest of the day.  She states that she can taste food now including soups and salads. She states that grape juice swirled around in her mouth brought her taste back.  She notes numbness in the tips of her fingers.  It has been worse over the past 3 weeks.  She can button buttons and zip zippers.   She denies any pain keeping her up at night.  She states her thighs feel tired after Taxotere.  She notes a temperature of 99. She denies any other symptoms. She has a bruise on her left cheek after Taxotere.   She states that she feels a whole lot better today. Last week she had trouble climbing stairs. He notes a little bit of numbness in the tips of her fingers today.  Past Medical History  Diagnosis Date  . Breast lump 2006     both breast   . Chest wall mass   . Cancer ([place])   . Uterine cancer [facility])     Past Surgical History  Procedure Laterality Date  . Cholecystectomy  2001  . Ankle surgery  2005  . Cesarean section  1993  . Back surgery  2005  . Breast biopsy Bilateral   . Colonoscopy    . Abdominal hysterectomy  01/30/15    Family History  Problem Relation Age of Onset  . Breast cancer Sister 61  . Breast cancer Maternal Aunt   . Breast cancer Maternal Grandmother  60  . Stomach cancer Maternal Aunt   . Lung cancer Maternal Grandfather   . Colon polyps Mother   . Cancer - Colon Cousin 10    first cousin  . Lupus Father   . Esophageal cancer Maternal Uncle     Social History:  reports that she has never smoked. She has never used smokeless tobacco. She reports that she does not drink alcohol or use illicit drugs.  Allergies:  Allergies  Allergen Reactions  . Pravastatin Hives and Nausea And Vomiting  . Sulfa Antibiotics Hives and Swelling  . Latex Rash  . Other Rash and Other (See Comments)    Pt states that she is allergic to spandex.    Current Outpatient Prescriptions  Medication Sig Dispense Refill  . lidocaine-prilocaine (EMLA) cream Apply 1 application topically as needed. Apply one hour prior to chemotherapy (Patient taking differently: Apply 1 application topically as needed (prior to accessing pts port). Pt applies one hour prior to chemotherapy.) 30 g 1  . ondansetron (ZOFRAN) 8 MG tablet Take 8 mg by mouth every 8 (eight) hours as needed for nausea or vomiting.     . lansoprazole (PREVACID) 30 MG capsule Take 30 mg by mouth as needed. Reported on 01/31/2016    . loratadine (CLARITIN) 10  MG tablet Take 10 mg by mouth daily.    . magic mouthwash SOLN Take 5 mLs by mouth 3 (three) times daily as needed for mouth pain. Pt swishes and spits out medication. (Patient not taking: Reported on 01/31/2016) 240 mL 3  . Multiple Vitamins-Minerals (ALIVE WOMENS ENERGY) TABS Take by mouth.    . nystatin (MYCOSTATIN) 100000 UNIT/ML suspension Take 5 mLs (500,000 Units total) by mouth 4 (four) times daily. 100 mL 0  . rivaroxaban (XARELTO) 20 MG TABS tablet Take 1 tablet (20 mg total) by mouth daily with supper. 90 tablet 1  . sucralfate (CARAFATE) 1 G tablet Take 1 tablet (1 g total) by mouth 4 (four) times daily. (Patient not taking: Reported on 01/31/2016) 120 tablet 1   No current facility-administered medications for this visit.    Facility-Administered Medications Ordered in Other Visits  Medication Dose Route Frequency Provider Last Rate Last Dose  . sodium chloride 0.9 % injection 10 mL  10 mL Intracatheter PRN Lequita Asal, MD        ROS  GENERAL:  Feels "ok".  Isolated temperature of 99.  No  sweats or weight loss. PERFORMANCE STATUS (ECOG):  1 HEENT:  Taste sensation affected by Taxotere.  No visual changes, runny nose, sore throat, mouth sores or tenderness. Lungs: No shortness of breath or cough.  No hemoptysis. Cardiac:  No chest pain, palpitations, orthopnea, or PND. GI:  No nausea, vomiting, diarrhea, constipation, melena or hematochezia. GU:  No urgency, frequency, dysuria, or hematuria. Musculoskeletal:  Thighs feel tired.  No back pain.  No joint pain.  No muscle tenderness. Extremities:  No pain or swelling. Skin:  Bruise left cheek.  No rashes or skin changes. Neuro:  Numbness in tips of fingers.  No headache, weakness, balance or coordination issues. Endocrine:  No diabetes, thyroid issues, hot flashes or night sweats. Psych:  No mood changes, depression or anxiety. Pain:  No focal pain. Review of systems:  All other systems reviewed and found to be negative.  Physical Exam  Blood pressure 125/82, pulse 102, temperature 97.7 F (36.5 C), temperature source Oral, resp. rate 18, height '5\' 4"'$  (1.626 m), weight 176 lb 14.7 oz (80.25 kg).  GENERAL:  Well developed, well nourished, sitting comfortably in the exam room in no acute distress. MENTAL STATUS:  Alert and oriented to person, place and time. HEAD:  Alopecia totalis.  Normocephalic, atraumatic, face symmetric, no Cushingoid features. EYES:  Glasses.  Brown eyes.  Pupils equal round and reactive to light and accomodation.  No conjunctivitis or scleral icterus. ENT:  Patchy fine thrush.   Tongue normal. Mucous membranes moist.  RESPIRATORY:  Clear to auscultation without rales, wheezes or rhonchi. CARDIOVASCULAR:  Regular rate and  rhythm without murmur, rub or gallop. ABDOMEN:  Soft, non-tender, with active bowel sounds, and no hepatosplenomegaly.  No masses. SKIN:  Small area of ecchymosis.  No rashes, ulcers or lesions. EXTREMITIES: No edema, no skin discoloration or tenderness.  No palpable cords. LYMPH NODES: No palpable cervical, supraclavicular, axillary or inguinal adenopathy  NEUROLOGICAL: Unremarkable. PSYCH:  Appropriate.  [facility] Test Results: On 04/23/2015:  hematocrit of 32.6, hemoglobin 10.9, platelets 111,000, WBC 5700 and ANC 3800. On 04/30/2015:  hematocrit of 34.5, hemoglobin 11.4, platelets 263,000, WBC 14,900 and ANC 11,900.  Creatinine 0.66, albumen 4.0, AST 35, ALT 46, bilirubin 0.2, alk phos 127.  Assessment:  The patient is a 55 year old African American woman with stage IV uterine leiomyosarcoma.  She underwent TAH/BSO  on 01/30/2015.  Pathology confirmed high grade leiomyosarcoma with 2 large adjacent tumor nodules (10 cm and 5 cm) and a separate 0.5 cm serosal nodule.     Chest, abdomen, and pelvic CT scan on 02/12/2015 revealed a 1.7 x 2.3 cm right external iliac node. Pulmonary nodules were scattered throughout the lungs bilaterally. There was left internal mammary chain adenopathy measuring up to 3.6 x 2 cm.  She underwent anterior chest wall pleural-based CT guided biopsy on 02/21/2015. Pathology was consistent with metastatic leiomyosarcoma.  Bone scan on 02/28/2015 revealed no evidence of metastatic disease with indeterminate uptake in the left posterior rib.   She has a fraternal twin who developed breast cancer at age 71.  Several other family members have malignancies.  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  Prechemotherapy labs from [facility] revealed leukopenia (WBC 2300 with ANC 900).  Work-up to date is negative.  She has a family history of leukopenia.  Bone marrow on 03/08/2015 revealed no evidence of  neoplasia or metastatic disease.  Marrow was normocellular for age (30-50%) with trilineage hematopoiesis.  There was no increase in marrow reticulin fibers. Flow cytometry and cytogenetics were normal.  She is status post 3 cycles of gemcitabine and Taxotere (03/20/2015 - 05/01/2015) with Neulasta support.  Nadir counts following cycle #1 on 04/02/2015 included a WBC 1400, ANC 600, and platelets 76,000.  Nadir counts following cycle #2 included a WBC 5700, ANC 3800, and platelets 111,000.  She experienced transient pain in her toes.  Symptomatically, she is fatigued.  Her taste has been affected.  She has little numbness in the tips of her fingers.  She had an isolated low grade temperature (99.0).  Exam reveals patchy fine thrush.  Plan:   1)  Review yesterday's labs from [facility]. 2)  Begin cycle #4 gemcitabine and Taxotere. 3)  Patient to return next week for day 8 chemotherapy followed by Neulasta on day 9. 4)  Rx: Nystatin swish and spit. 5)  Complete slips [facility] at [facility]. 6)  Patient to contact clinic if fever recurs, chills, or feels sick. 7)  Chest CT on 05/25/2015. 8)  RTC in 1 week for MD assessment, review of [facility] labs, review of chest CT, and gemcitabine +/- Taxotere.   Lequita Asal, MD  05/22/2015

## 2016-02-07 ENCOUNTER — Encounter: Payer: Self-pay | Admitting: Hematology and Oncology

## 2016-02-08 ENCOUNTER — Inpatient Hospital Stay (HOSPITAL_BASED_OUTPATIENT_CLINIC_OR_DEPARTMENT_OTHER): Payer: 59 | Admitting: Family Medicine

## 2016-02-08 ENCOUNTER — Inpatient Hospital Stay: Payer: 59

## 2016-02-08 ENCOUNTER — Other Ambulatory Visit: Payer: Self-pay | Admitting: Hematology and Oncology

## 2016-02-08 ENCOUNTER — Inpatient Hospital Stay: Payer: 59 | Admitting: Family Medicine

## 2016-02-08 VITALS — BP 123/83 | HR 101 | Temp 96.0°F | Resp 18 | Wt 170.0 lb

## 2016-02-08 DIAGNOSIS — C782 Secondary malignant neoplasm of pleura: Secondary | ICD-10-CM | POA: Diagnosis not present

## 2016-02-08 DIAGNOSIS — C7802 Secondary malignant neoplasm of left lung: Secondary | ICD-10-CM | POA: Diagnosis not present

## 2016-02-08 DIAGNOSIS — C55 Malignant neoplasm of uterus, part unspecified: Secondary | ICD-10-CM

## 2016-02-08 DIAGNOSIS — C7801 Secondary malignant neoplasm of right lung: Secondary | ICD-10-CM | POA: Diagnosis not present

## 2016-02-08 DIAGNOSIS — Z79899 Other long term (current) drug therapy: Secondary | ICD-10-CM

## 2016-02-08 LAB — CBC WITH DIFFERENTIAL/PLATELET
Basophils Absolute: 0 10*3/uL (ref 0–0.1)
Basophils Relative: 1 %
Eosinophils Absolute: 0.2 10*3/uL (ref 0–0.7)
Eosinophils Relative: 7 %
HEMATOCRIT: 36 % (ref 35.0–47.0)
Hemoglobin: 12.2 g/dL (ref 12.0–16.0)
LYMPHS PCT: 32 %
Lymphs Abs: 0.8 10*3/uL — ABNORMAL LOW (ref 1.0–3.6)
MCH: 30.4 pg (ref 26.0–34.0)
MCHC: 33.8 g/dL (ref 32.0–36.0)
MCV: 89.9 fL (ref 80.0–100.0)
MONO ABS: 0.3 10*3/uL (ref 0.2–0.9)
MONOS PCT: 11 %
NEUTROS ABS: 1.2 10*3/uL — AB (ref 1.4–6.5)
Neutrophils Relative %: 49 %
PLATELETS: 196 10*3/uL (ref 150–440)
RBC: 4 MIL/uL (ref 3.80–5.20)
RDW: 13.5 % (ref 11.5–14.5)
WBC: 2.4 10*3/uL — ABNORMAL LOW (ref 3.6–11.0)

## 2016-02-08 MED ORDER — OLARATUMAB CHEMO INJECTION 500 MG/50ML
15.0000 mg/kg | Freq: Once | INTRAVENOUS | Status: AC
  Administered 2016-02-08: 1110 mg via INTRAVENOUS
  Filled 2016-02-08: qty 111

## 2016-02-08 MED ORDER — SODIUM CHLORIDE 0.9 % IV SOLN
20.0000 mg | Freq: Once | INTRAVENOUS | Status: AC
  Administered 2016-02-08: 20 mg via INTRAVENOUS
  Filled 2016-02-08: qty 2

## 2016-02-08 MED ORDER — DIPHENHYDRAMINE HCL 50 MG/ML IJ SOLN
50.0000 mg | Freq: Once | INTRAMUSCULAR | Status: AC
  Administered 2016-02-08: 50 mg via INTRAVENOUS
  Filled 2016-02-08: qty 1

## 2016-02-08 MED ORDER — HEPARIN SOD (PORK) LOCK FLUSH 100 UNIT/ML IV SOLN
500.0000 [IU] | Freq: Once | INTRAVENOUS | Status: AC
  Administered 2016-02-08: 500 [IU] via INTRAVENOUS
  Filled 2016-02-08: qty 5

## 2016-02-08 MED ORDER — SODIUM CHLORIDE 0.9 % IV SOLN
Freq: Once | INTRAVENOUS | Status: AC
  Administered 2016-02-08: 15:00:00 via INTRAVENOUS
  Filled 2016-02-08: qty 1000

## 2016-02-08 MED ORDER — SODIUM CHLORIDE 0.9% FLUSH
10.0000 mL | INTRAVENOUS | Status: DC | PRN
  Administered 2016-02-08: 10 mL via INTRAVENOUS
  Filled 2016-02-08: qty 10

## 2016-02-08 NOTE — Progress Notes (Signed)
[facility] day:  02/08/2016   Chief Complaint: Cassidy Bradley is an 55 y.o. female with metastatic uterine leiomyosarcoma who is seen for assessment prior to day 1 of cycle #3 single agent olaratumab [name].  HPI: The patient was last seen in the medical oncology clinic by me on 01/04/2016.  At that time, she received day 8 of cycle #1 single agent olaratumab.  She felt great.  She noted a little tenderness of her left lateral thigh.  She denied any trauma or increased swelling.  She received her infusion uneventfully.  During the interim, she has done well. Patient had labs performed at [facility]. she reports feeling very well.   Past Medical History  Diagnosis Date  . Breast lump 2006     both breast   . Chest wall mass   . Cancer ([place])   . Uterine cancer [facility])     Past Surgical History  Procedure Laterality Date  . Cholecystectomy  2001  . Ankle surgery  2005  . Cesarean section  1993  . Back surgery  2005  . Breast biopsy Bilateral   . Colonoscopy    . Abdominal hysterectomy  01/30/15   Family History  Problem Relation Age of Onset  . Breast cancer Sister 47  . Breast cancer Maternal Aunt   . Breast cancer Maternal Grandmother 60  . Stomach cancer Maternal Aunt   . Lung cancer Maternal Grandfather   . Colon polyps Mother   . Cancer - Colon Cousin 21    first cousin  . Lupus Father   . Esophageal cancer Maternal Uncle     Social History:  reports that she has never smoked. She has never used smokeless tobacco. She reports that she does not drink alcohol or use illicit drugs.   The patient is accompanied by her husband.  Allergies:  Allergies  Allergen Reactions  . Pravastatin Hives and Nausea And Vomiting  . Sulfa Antibiotics Hives and Swelling  . Latex Rash  . Other Rash and Other (See Comments)    Pt states that she is allergic to spandex.   Current Medications: Current Outpatient Prescriptions  Medication  Sig Dispense Refill  . lansoprazole (PREVACID) 30 MG capsule Take 30 mg by mouth as needed. Reported on 01/31/2016    . lidocaine-prilocaine (EMLA) cream Apply 1 application topically as needed. Apply one hour prior to chemotherapy (Patient taking differently: Apply 1 application topically as needed (prior to accessing pts port). Pt applies one hour prior to chemotherapy.) 30 g 1  . loratadine (CLARITIN) 10 MG tablet Take 10 mg by mouth daily.    . magic mouthwash SOLN Take 5 mLs by mouth 3 (three) times daily as needed for mouth pain. Pt swishes and spits out medication. 240 mL 3  . Multiple Vitamins-Minerals (ALIVE WOMENS ENERGY) TABS Take by mouth.    . ondansetron (ZOFRAN) 8 MG tablet Take 8 mg by mouth every 8 (eight) hours as needed for nausea or vomiting.     . rivaroxaban (XARELTO) 20 MG TABS tablet Take 1 tablet (20 mg total) by mouth daily with supper. 90 tablet 1   No current facility-administered medications for this visit.   Facility-Administered Medications Ordered in Other Visits  Medication Dose Route Frequency Provider Last Rate Last Dose  . dexamethasone (DECADRON) 20 mg in sodium chloride 0.9 % 50 mL IVPB  20 mg Intravenous Once Lequita Asal, MD   20 mg at 02/08/16  1445  . heparin lock flush 100 unit/mL  500 Units Intravenous Once Evlyn Kanner, NP      . olaratumab (LARTRUVO) 1,110 mg in sodium chloride 0.9 % 139 mL chemo infusion  15 mg/kg (Treatment Plan Actual) Intravenous Once Lequita Asal, MD      . sodium chloride 0.9 % injection 10 mL  10 mL Intracatheter PRN Lequita Asal, MD      . sodium chloride flush (NS) 0.9 % injection 10 mL  10 mL Intravenous PRN Evlyn Kanner, NP   10 mL at 02/08/16 1419   Review of Systems:  GENERAL:  Feels well,  No fevers or sweats.   PERFORMANCE STATUS (ECOG):  1-2 HEENT:  No visual changes, runny nose, sore throat, mouth sores or tenderness. Lungs: No shortness of breath or cough.  No hemoptysis. Cardiac:  No chest  pain, palpitations, orthopnea, or PND. GI:  Reflux. No nausea, vomiting, diarrhea, constipation, melena, or hematochezia. GU:  No urgency, frequency, dysuria, or hematuria. Musculoskeletal:  Little left thigh muscle pain, persists.  No back pain.  No joint pain.  No muscle tenderness. Extremities: Left lower extremity edema (chronic).  No bone pain. Skin:  No rashes or skin changes. Neuro:  No headache, numbness or weakness, balance or coordination issues. Endocrine:  No diabetes, thyroid issues, hot flashes or night sweats. Psych:  No mood changes, depression or anxiety. Pain:  No focal pain. Review of systems:  All other systems reviewed and found to be negative.                        Physical Exam: Blood pressure 123/83, pulse 101, temperature 96 F (35.6 C), temperature source Tympanic, resp. rate 18, weight 169 lb 15.6 oz (77.1 kg).  GENERAL:  Well developed, well nourished, sitting comfortably in the exam room in no acute distress. MENTAL STATUS:  Alert and oriented to person, place and time. HEAD:  Wearing a black wrap.  Alopecia totalis.  Normocephalic, atraumatic, face symmetric, no Cushingoid features. EYES:  Glasses.  Brown eyes.  Pupils equal round and reactive to light and accomodation.  No conjunctivitis or scleral icterus. ENT:  Oropharynx clear without lesion.  Tongue normal. Mucous membranes moist.  RESPIRATORY:  Clear to auscultation without rales, wheezes or rhonchi. CARDIOVASCULAR:  Regular rate and rhythm without murmur, rub or gallop. No JVD. ABDOMEN:  Soft, non-tender, with active bowel sounds, and no hepatosplenomegaly.  No masses. SKIN:  No rashes, ulcers or lesions. EXTREMITIES:  Mild left lower extremity edema (stable).   LYMPH NODES: No palpable cervical, supraclavicular, axillary or inguinal adenopathy  NEUROLOGICAL: Unremarkable. PSYCH:  Appropriate.  Laboratory testing: [facility] labs on 02/07/2016 have been scanned into the record. Repeat testing in our  lab was performed due to inconsistent values.   Infusion on 02/08/2016  Component Date Value Ref Range Status  . WBC 02/08/2016 2.4* 3.6 - 11.0 K/uL Final  . RBC 02/08/2016 4.00  3.80 - 5.20 MIL/uL Final  . Hemoglobin 02/08/2016 12.2  12.0 - 16.0 g/dL Final  . HCT 02/08/2016 36.0  35.0 - 47.0 % Final  . MCV 02/08/2016 89.9  80.0 - 100.0 fL Final  . MCH 02/08/2016 30.4  26.0 - 34.0 pg Final  . MCHC 02/08/2016 33.8  32.0 - 36.0 g/dL Final  . RDW 02/08/2016 13.5  11.5 - 14.5 % Final  . Platelets 02/08/2016 196  150 - 440 K/uL Final  . Neutrophils Relative % 02/08/2016 49  Final  . Neutro Abs 02/08/2016 1.2* 1.4 - 6.5 K/uL Final  . Lymphocytes Relative 02/08/2016 32   Final  . Lymphs Abs 02/08/2016 0.8* 1.0 - 3.6 K/uL Final  . Monocytes Relative 02/08/2016 11   Final  . Monocytes Absolute 02/08/2016 0.3  0.2 - 0.9 K/uL Final  . Eosinophils Relative 02/08/2016 7   Final  . Eosinophils Absolute 02/08/2016 0.2  0 - 0.7 K/uL Final  . Basophils Relative 02/08/2016 1   Final  . Basophils Absolute 02/08/2016 0.0  0 - 0.1 K/uL Final   Assessment:  Cassidy Bradley is a 55 y.o. female 55 y.o. female with stage IV uterine leiomyosarcoma. She underwent TAH/BSO on 01/30/2015. Pathology confirmed high grade leiomyosarcoma with 2 large adjacent tumor nodules (10 cm and 5 cm) and a separate 0.5 cm serosal nodule.   Chest, abdomen, and pelvic CT scan on 02/12/2015 revealed a 1.7 x 2.3 cm right external iliac node. Pulmonary nodules were scattered throughout the lungs bilaterally. There was left internal mammary chain adenopathy measuring up to 3.6 x 2 cm.  She underwent anterior chest wall pleural-based CT guided biopsy on 02/21/2015. Pathology was consistent with metastatic leiomyosarcoma. Bone scan on 02/28/2015 revealed no evidence of metastatic disease with indeterminate uptake in the left posterior rib.   She has a fraternal twin who developed breast cancer at age 64. Several other family  members have malignancies. 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  Prechemotherapy labs from [facility] revealed leukopenia (WBC 2300 with ANC 900). Work-up to date is negative. She has a family history of leukopenia. Bone marrow on 03/08/2015 revealed no evidence of neoplasia or metastatic disease. Marrow was normocellular for age (30-50%) with trilineage hematopoiesis. There was no increase in marrow reticulin fibers. Flow cytometry and cytogenetics were normal.  She received 4 cycles of gemcitabine and Taxotere (03/20/2015 - 05/22/2015) with Neulasta support. She received gemcitabine alone on 05/22/2015.  She was admitted on 05/24/2015 with an acute febrile illness. All cultures were negative.   Chest CT on 05/25/2015 revealed early progressive disease. Abdominal and pelvic CT scan on 05/30/2015 revealed stable to slightly increased right external iliac lymph node (1.8 x 2.5 cm).   Echocardiogram on 06/18/2015 revealed EF of 60-65%.  Echo on 10/22/2015 revealed an EF of 65%.  MUGA on 12/25/2015 revealed an EF of 52%.  Bilateral lower extremity duplex on 05/30/2015 was negative.  Left upper extremity duplex on 06/08/2015 was negative.  Left lower extremity duplex on 07/19/2015 documented a DVT.  She is on Xarelto.   She received 8 cycles of adriamycin (06/07/2015 - 12/06/2015) with Neulasta (On-Pro) support.  Cycle #3 was complicated by an acute  left lower extremity DVT.  Cycle #6 was postponed a week secondary to neutropenia ([place] 700).  Zinecard was added with cycle #7.  Olaratumab [name] was added with cycle #8.  Chest, abdomen, and pelvic CT scan on 08/07/2015 revealed a partial treatment response with decrease anterior left upper pleural metastasis, stable to decreased pulmonary metastases and decreased right external iliac adenopathy. There were no new sites of disease.  Chest, abdomen, and pelvic CT  scan on 10/22/2015 revealed mild decrease in left anterior chest wall mass/internal mammary lymphadenopathy (3.1 x 4.6 cm to 2.5 x 4.4 cm).  There was decreased mild right external iliac lymphadenopathy (1.4 cm to 1.2 cm).  There was diffuse stable bilateral pulmonary metastases (largest 1.1 cm in anterior right lung).  There was no new  or progressive metastatic disease.  Chest CT on 12/25/2015 on revealed slight progression in pulmonary nodules (index nodules: 5 mm to 11 mm and 8 mm to 11 mm).  The dominant soft tissue lesion along the left hemi-thorax was 2.2 x 4.3 cm (stable).  She is status post 2 cycles of single agent Olaratumab [name] (12/27/2015).  She is tolerating her infusions well.  Symptomatically, she feels better off of adriamycin. She has persistent mild discomfort in her left lateral leg.  Exam is stable.  Plan: 1.  Review [facility] labs. Labs were repeated in our laboratory due to inconsistencies. 2.  Proceed with cycle #3 Lartruvo today. 3.  RTC in 1 week for day 8 of cycle #3 [name], MD assessment. [facility] form was given to patient. 4.  Continue Xarelto.   Evlyn Kanner, NP 02/08/2016, 2:56 PM

## 2016-02-08 NOTE — Progress Notes (Signed)
ANC: 1200. Notified Georgeanne Nim, NP, via telephone. Per Georgeanne Nim, NP, order: proceed with chemotherapy treatment today.

## 2016-02-12 ENCOUNTER — Inpatient Hospital Stay: Payer: 59 | Admitting: Hematology and Oncology

## 2016-02-12 ENCOUNTER — Other Ambulatory Visit: Payer: Self-pay | Admitting: Hematology and Oncology

## 2016-02-12 ENCOUNTER — Inpatient Hospital Stay: Payer: 59

## 2016-02-13 ENCOUNTER — Ambulatory Visit: Admission: RE | Admit: 2016-02-13 | Payer: 59 | Source: Ambulatory Visit

## 2016-02-15 ENCOUNTER — Inpatient Hospital Stay (HOSPITAL_BASED_OUTPATIENT_CLINIC_OR_DEPARTMENT_OTHER): Payer: 59 | Admitting: Hematology and Oncology

## 2016-02-15 ENCOUNTER — Inpatient Hospital Stay: Payer: 59

## 2016-02-15 ENCOUNTER — Other Ambulatory Visit: Payer: Self-pay | Admitting: Hematology and Oncology

## 2016-02-15 VITALS — BP 134/76 | HR 98 | Temp 97.0°F | Wt 170.2 lb

## 2016-02-15 DIAGNOSIS — C7802 Secondary malignant neoplasm of left lung: Secondary | ICD-10-CM | POA: Diagnosis not present

## 2016-02-15 DIAGNOSIS — C55 Malignant neoplasm of uterus, part unspecified: Secondary | ICD-10-CM

## 2016-02-15 DIAGNOSIS — C782 Secondary malignant neoplasm of pleura: Secondary | ICD-10-CM

## 2016-02-15 DIAGNOSIS — C7801 Secondary malignant neoplasm of right lung: Secondary | ICD-10-CM | POA: Diagnosis not present

## 2016-02-15 DIAGNOSIS — Z79899 Other long term (current) drug therapy: Secondary | ICD-10-CM

## 2016-02-15 DIAGNOSIS — Z86718 Personal history of other venous thrombosis and embolism: Secondary | ICD-10-CM

## 2016-02-15 DIAGNOSIS — D709 Neutropenia, unspecified: Secondary | ICD-10-CM

## 2016-02-15 DIAGNOSIS — R55 Syncope and collapse: Secondary | ICD-10-CM

## 2016-02-15 MED ORDER — SODIUM CHLORIDE 0.9 % IV SOLN
Freq: Once | INTRAVENOUS | Status: AC
  Administered 2016-02-15: 11:00:00 via INTRAVENOUS
  Filled 2016-02-15: qty 1000

## 2016-02-15 MED ORDER — SODIUM CHLORIDE 0.9 % IV SOLN
15.0000 mg/kg | Freq: Once | INTRAVENOUS | Status: AC
  Administered 2016-02-15: 1110 mg via INTRAVENOUS
  Filled 2016-02-15: qty 111

## 2016-02-15 MED ORDER — HEPARIN SOD (PORK) LOCK FLUSH 100 UNIT/ML IV SOLN
500.0000 [IU] | Freq: Once | INTRAVENOUS | Status: AC | PRN
Start: 2016-02-15 — End: 2016-02-15
  Administered 2016-02-15: 500 [IU]
  Filled 2016-02-15: qty 5

## 2016-02-15 MED ORDER — DIPHENHYDRAMINE HCL 50 MG/ML IJ SOLN
50.0000 mg | Freq: Once | INTRAMUSCULAR | Status: AC
Start: 2016-02-15 — End: 2016-02-15
  Administered 2016-02-15: 50 mg via INTRAVENOUS
  Filled 2016-02-15: qty 1

## 2016-02-15 MED ORDER — SODIUM CHLORIDE 0.9 % IV SOLN
20.0000 mg | Freq: Once | INTRAVENOUS | Status: AC
  Administered 2016-02-15: 20 mg via INTRAVENOUS
  Filled 2016-02-15: qty 2

## 2016-02-15 MED ORDER — SODIUM CHLORIDE 0.9 % IJ SOLN
10.0000 mL | INTRAMUSCULAR | Status: DC | PRN
  Administered 2016-02-15: 10 mL
  Filled 2016-02-15: qty 10

## 2016-02-15 NOTE — Progress Notes (Signed)
Patient has found that the Alive multivitamin helps relieve the constipation.  She has new right side back pain and is hard to stretch for thins in the morning but does improve throughout the day.

## 2016-02-15 NOTE — Progress Notes (Signed)
[facility] day:  02/15/2016   Chief Complaint: Cassidy Bradley is an 55 y.o. female with metastatic uterine leiomyosarcoma who is seen for assessment on day 8 of cycle #3 single agent olaratumab [name].  HPI: The patient was last seen in the medical oncology clinic by me on 01/31/2016.  At that time, she was seen for sick call visit on day 14 of cycle #2 Lartruvo.  She was fatigued.  She had lost 2 pounds.  She had episodes of pre-syncope improved after eating.  She had been drinking only plain water.  She had persistent pain in her left femur with plain films negative on 01/18/2016.  She had chronic neutropenia preceding treatment.  A left lower extremity duplex and MRI were ordered.  Duplex revealed re-cannulization of the prior DVT involving the left popliteal vein.  She has not had her MRI.  She was seen by  Georgeanne Nim, NP on 02/08/2016 for day #1 of cycle #3 Lartruvo.  Due to inconsistencies with [facility] labs, she had repeat CBC performed at [facility].  CBC revealed a hematocrit of 36, hemoglobin 12.2, MCV 89.9, platelets 196,000, white count 2400 with an ANC of 1200.  She received her treatment.  Symptomatically, her leg feels a "whole lot better".  She denies any swelling.  She notes mild discomfort on her right side possibly  "secondary to her bed".  Past Medical History  Diagnosis Date  . Breast lump 2006     both breast   . Chest wall mass   . Cancer ([place])   . Uterine cancer [facility])     Past Surgical History  Procedure Laterality Date  . Cholecystectomy  2001  . Ankle surgery  2005  . Cesarean section  1993  . Back surgery  2005  . Breast biopsy Bilateral   . Colonoscopy    . Abdominal hysterectomy  01/30/15   Family History  Problem Relation Age of Onset  . Breast cancer Sister 47  . Breast cancer Maternal Aunt   . Breast cancer Maternal Grandmother 60  . Stomach cancer Maternal Aunt   . Lung cancer Maternal Grandfather    . Colon polyps Mother   . Cancer - Colon Cousin 55    first cousin  . Lupus Father   . Esophageal cancer Maternal Uncle     Social History:  reports that she has never smoked. She has never used smokeless tobacco. She reports that she does not drink alcohol or use illicit drugs.   The patient is accompanied by her husband today.  Allergies:  Allergies  Allergen Reactions  . Pravastatin Hives and Nausea And Vomiting  . Sulfa Antibiotics Hives and Swelling  . Latex Rash  . Other Rash and Other (See Comments)    Pt states that she is allergic to spandex.   Current Medications: Current Outpatient Prescriptions  Medication Sig Dispense Refill  . lansoprazole (PREVACID) 30 MG capsule Take 30 mg by mouth as needed. Reported on 01/31/2016    . lidocaine-prilocaine (EMLA) cream Apply 1 application topically as needed. Apply one hour prior to chemotherapy (Patient taking differently: Apply 1 application topically as needed (prior to accessing pts port). Pt applies one hour prior to chemotherapy.) 30 g 1  . loratadine (CLARITIN) 10 MG tablet Take 10 mg by mouth daily.    . magic mouthwash SOLN Take 5 mLs by mouth 3 (three) times daily as needed for mouth pain. Pt swishes and spits out  medication. 240 mL 3  . Multiple Vitamins-Minerals (ALIVE WOMENS ENERGY) TABS Take by mouth.    . ondansetron (ZOFRAN) 8 MG tablet Take 8 mg by mouth every 8 (eight) hours as needed for nausea or vomiting.     . rivaroxaban (XARELTO) 20 MG TABS tablet Take 1 tablet (20 mg total) by mouth daily with supper. 90 tablet 1   No current facility-administered medications for this visit.   Facility-Administered Medications Ordered in Other Visits  Medication Dose Route Frequency Provider Last Rate Last Dose  . sodium chloride 0.9 % injection 10 mL  10 mL Intracatheter PRN Lequita Asal, MD        Review of Systems:  GENERAL:  Feels good.  No fevers or sweats.  Weight up 1 pound. PERFORMANCE STATUS (ECOG):   2 HEENT:  No visual changes, runny nose, sore throat, mouth sores or tenderness. Lungs:  Little right sided rib/chest wall pain.  No shortness of breath or cough.  No hemoptysis. Cardiac:  No chest pain, palpitations, orthopnea, or PND. GI:  Reflux. No nausea, vomiting, diarrhea, constipation, melena, or hematochezia. GU:  No urgency, frequency, dysuria, or hematuria. Musculoskeletal:  Persistent left lateral thigh pain.  No back pain.  No joint pain.  No muscle tenderness. Extremities: Left lower extremity edema (chronic).  Left leg feels better.  Skin:  No rashes or skin changes. Neuro:  No headache, numbness or weakness, balance or coordination issues. Endocrine:  No diabetes, thyroid issues, hot flashes or night sweats. Psych:  No mood changes, depression or anxiety. Pain:  No focal pain. Review of systems:  All other systems reviewed and found to be negative.                        Physical Exam: Blood pressure 134/76, pulse 98, temperature 97 F (36.1 C), temperature source Tympanic, weight 170 lb 3.1 oz (77.2 kg).  GENERAL:  Well developed, well nourished, sitting comfortably in the exam room in no acute distress. MENTAL STATUS:  Alert and oriented to person, place and time. HEAD:  Wearing a gray cap.  Fine new hair growth.  Normocephalic, atraumatic, face symmetric, no Cushingoid features. EYES:  Glasses.  Brown eyes.  Pupils equal round and reactive to light and accomodation.  No conjunctivitis or scleral icterus. ENT:  Oropharynx clear without lesion.  Tongue normal. Mucous membranes moist.  RESPIRATORY:  Clear to auscultation without rales, wheezes or rhonchi. CARDIOVASCULAR:  Regular rate and rhythm without murmur, rub or gallop. No JVD. CHEST WALL:  Discomfort somewhat superficial associated with muscle. ABDOMEN:  Soft, non-tender, with active bowel sounds, and no hepatosplenomegaly.  No masses. SKIN:  No rashes, ulcers or lesions. EXTREMITIES:  Mild left lower extremity  edema (minimal). LYMPH NODES: No palpable cervical, supraclavicular, axillary or inguinal adenopathy  NEUROLOGICAL: Appropriate. PSYCH:  Appropriate.  Laboratory testing: [facility] labs on 02/14/2016 included a hematocrit 37.5, hemoglobin 12.5, platelets 241,000, white count 2300 with an [place] of 1200.  Comprehensive metabolic panel included a creatinine of 0.66. Liver function tests included an alkaline phosphatase of 119 (39-117).    No visits with results within 3 Day(s) from this visit. Latest known visit with results is:  Infusion on 02/08/2016  Component Date Value Ref Range Status  . WBC 02/08/2016 2.4* 3.6 - 11.0 K/uL Final  . RBC 02/08/2016 4.00  3.80 - 5.20 MIL/uL Final  . Hemoglobin 02/08/2016 12.2  12.0 - 16.0 g/dL Final  . HCT 02/08/2016 36.0  35.0 - 47.0 % Final  . MCV 02/08/2016 89.9  80.0 - 100.0 fL Final  . MCH 02/08/2016 30.4  26.0 - 34.0 pg Final  . MCHC 02/08/2016 33.8  32.0 - 36.0 g/dL Final  . RDW 19/03/6792 13.5  11.5 - 14.5 % Final  . Platelets 02/08/2016 196  150 - 440 K/uL Final  . Neutrophils Relative % 02/08/2016 49   Final  . Neutro Abs 02/08/2016 1.2* 1.4 - 6.5 K/uL Final  . Lymphocytes Relative 02/08/2016 32   Final  . Lymphs Abs 02/08/2016 0.8* 1.0 - 3.6 K/uL Final  . Monocytes Relative 02/08/2016 11   Final  . Monocytes Absolute 02/08/2016 0.3  0.2 - 0.9 K/uL Final  . Eosinophils Relative 02/08/2016 7   Final  . Eosinophils Absolute 02/08/2016 0.2  0 - 0.7 K/uL Final  . Basophils Relative 02/08/2016 1   Final  . Basophils Absolute 02/08/2016 0.0  0 - 0.1 K/uL Final   Assessment:  Cassidy Bradley is a 55 y.o. female 55 y.o. female with stage IV uterine leiomyosarcoma. She underwent TAH/BSO on 01/30/2015. Pathology confirmed high grade leiomyosarcoma with 2 large adjacent tumor nodules (10 cm and 5 cm) and a separate 0.5 cm serosal nodule.   Chest, abdomen, and pelvic CT scan on 02/12/2015 revealed a 1.7 x 2.3 cm right external iliac node. Pulmonary  nodules were scattered throughout the lungs bilaterally. There was left internal mammary chain adenopathy measuring up to 3.6 x 2 cm.  She underwent anterior chest wall pleural-based CT guided biopsy on 02/21/2015. Pathology was consistent with metastatic leiomyosarcoma. Bone scan on 02/28/2015 revealed no evidence of metastatic disease with indeterminate uptake in the left posterior rib.   She has a fraternal twin who developed breast cancer at age 83. Several other family members have malignancies. 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  Prechemotherapy labs from [facility] revealed leukopenia (WBC 2300 with ANC 900). Work-up to date is negative. She has a family history of leukopenia. Bone marrow on 03/08/2015 revealed no evidence of neoplasia or metastatic disease. Marrow was normocellular for age (30-50%) with trilineage hematopoiesis. There was no increase in marrow reticulin fibers. Flow cytometry and cytogenetics were normal.  She received 4 cycles of gemcitabine and Taxotere (03/20/2015 - 05/22/2015) with Neulasta support. She received gemcitabine alone on 05/22/2015.  She was admitted on 05/24/2015 with an acute febrile illness. All cultures were negative.   Chest CT on 05/25/2015 revealed early progressive disease. Abdominal and pelvic CT scan on 05/30/2015 revealed stable to slightly increased right external iliac lymph node (1.8 x 2.5 cm).   Echocardiogram on 06/18/2015 revealed EF of 60-65%.  Echo on 10/22/2015 revealed an EF of 65%.  MUGA on 12/25/2015 revealed an EF of 52%.  Bilateral lower extremity duplex on 05/30/2015 was negative.  Left upper extremity duplex on 06/08/2015 was negative.  Left lower extremity duplex on 07/19/2015 documented a DVT.   Left lower extremity duplex on 01/31/2016 revealed re-cannulization of the prior DVT involving the left popliteal vein.  She is on Xarelto.   She  received 8 cycles of adriamycin (06/07/2015 - 12/06/2015) with Neulasta (On-Pro) support.  Cycle #3 was complicated by an acute  left lower extremity DVT.  Cycle #6 was postponed a week secondary to neutropenia (ANC 700).  Zinecard was added with cycle #7.  Olaratumab Drucilla Schmidt) was added with cycle #8.  Chest, abdomen, and pelvic CT scan on 08/07/2015 revealed a partial treatment response  with decrease anterior left upper pleural metastasis, stable to decreased pulmonary metastases and decreased right external iliac adenopathy. There were no new sites of disease.  Chest, abdomen, and pelvic CT scan on 10/22/2015 revealed mild decrease in left anterior chest wall mass/internal mammary lymphadenopathy (3.1 x 4.6 cm to 2.5 x 4.4 cm).  There was decreased mild right external iliac lymphadenopathy (1.4 cm to 1.2 cm).  There was diffuse stable bilateral pulmonary metastases (largest 1.1 cm in anterior right lung).  There was no new or progressive metastatic disease.  Chest CT on 12/25/2015 on revealed slight progression in pulmonary nodules (index nodules: 5 mm to 11 mm and 8 mm to 11 mm).  The dominant soft tissue lesion along the left hemi-thorax was 2.2 x 4.3 cm (stable).  She is currently day 8 of cycle #3 single agent olaratumab [name] (12/27/2015 -02/08/2016).  Day 8 of cycle #2 olaratumab was held secondary to neutropenia.  Symptomatically, she notes improvement in left femur discomfort and mild right sided rib muscluloskeletal discomfort.  She has a history of chronic neutropenia preceding treatment.  Plan: 1.  Review [facility] labs. 2.  Day 8 of cycle #3 Lartruvo. 3.  RTC on 02/19/2016 for Neupogen 480 mcg SQ. 4.  PET scan for 02/26/2016: restaging uterine leiomyosarcoma. 5.  RTC on 02/26/2016 for Neupogen after PET scan. 6.  Continue Xarelto. 7.  Slip for [facility] labs. 8.  RTC on 02/29/2016 for MD assessment, review of [facility] labs and PET scan, and day 1 of cycle #4 Lartruvo.   Lequita Asal, MD 02/15/2016, 10:07 AM

## 2016-02-17 ENCOUNTER — Other Ambulatory Visit: Payer: Self-pay | Admitting: Hematology and Oncology

## 2016-02-17 ENCOUNTER — Encounter: Payer: Self-pay | Admitting: Hematology and Oncology

## 2016-02-18 ENCOUNTER — Other Ambulatory Visit: Payer: Self-pay | Admitting: Hematology and Oncology

## 2016-02-19 ENCOUNTER — Other Ambulatory Visit: Payer: Self-pay | Admitting: Hematology and Oncology

## 2016-02-19 ENCOUNTER — Inpatient Hospital Stay: Payer: 59

## 2016-02-19 VITALS — BP 117/81 | HR 91 | Temp 95.3°F | Resp 18

## 2016-02-19 DIAGNOSIS — C55 Malignant neoplasm of uterus, part unspecified: Secondary | ICD-10-CM

## 2016-02-19 MED ORDER — TBO-FILGRASTIM 480 MCG/0.8ML ~~LOC~~ SOSY
480.0000 ug | PREFILLED_SYRINGE | Freq: Once | SUBCUTANEOUS | Status: AC
  Administered 2016-02-19: 480 ug via SUBCUTANEOUS
  Filled 2016-02-19: qty 0.8

## 2016-02-19 MED ORDER — FILGRASTIM 480 MCG/0.8ML IJ SOSY: 480.0000 ug | PREFILLED_SYRINGE | Freq: Once | INTRAMUSCULAR | Status: DC

## 2016-02-25 ENCOUNTER — Encounter: Payer: Self-pay | Admitting: Hematology and Oncology

## 2016-02-26 ENCOUNTER — Encounter
Admission: RE | Admit: 2016-02-26 | Discharge: 2016-02-26 | Disposition: A | Discharge status: Home / Self Care | Payer: 59 | Source: Ambulatory Visit | Attending: Hematology and Oncology | Admitting: Hematology and Oncology

## 2016-02-26 ENCOUNTER — Ambulatory Visit: Payer: 59

## 2016-02-26 ENCOUNTER — Inpatient Hospital Stay: Payer: 59

## 2016-02-26 ENCOUNTER — Other Ambulatory Visit: Payer: Self-pay | Admitting: Hematology and Oncology

## 2016-02-26 VITALS — BP 104/68 | HR 98 | Temp 97.5°F | Resp 18

## 2016-02-26 DIAGNOSIS — C55 Malignant neoplasm of uterus, part unspecified: Secondary | ICD-10-CM | POA: Insufficient documentation

## 2016-02-26 LAB — GLUCOSE, CAPILLARY: Glucose-Capillary: 93 mg/dL (ref 65–99)

## 2016-02-26 MED ORDER — FILGRASTIM 480 MCG/0.8ML IJ SOSY
480.0000 ug | PREFILLED_SYRINGE | Freq: Once | INTRAMUSCULAR | Status: DC
  Filled 2016-02-26: qty 0.8

## 2016-02-26 MED ORDER — TBO-FILGRASTIM 480 MCG/0.8ML ~~LOC~~ SOSY
480.0000 ug | PREFILLED_SYRINGE | Freq: Once | SUBCUTANEOUS | Status: AC
  Administered 2016-02-26: 480 ug via SUBCUTANEOUS
  Filled 2016-02-26: qty 0.8

## 2016-02-26 MED ORDER — FLUDEOXYGLUCOSE F - 18 (FDG) INJECTION
12.3500 | Freq: Once | INTRAVENOUS | Status: AC | PRN
  Administered 2016-02-26: 12.35 via INTRAVENOUS

## 2016-02-28 ENCOUNTER — Encounter: Payer: Self-pay | Admitting: Hematology and Oncology

## 2016-02-29 ENCOUNTER — Inpatient Hospital Stay: Payer: 59 | Attending: Hematology and Oncology | Admitting: Hematology and Oncology

## 2016-02-29 ENCOUNTER — Inpatient Hospital Stay: Payer: 59

## 2016-02-29 VITALS — BP 136/96 | HR 97 | Temp 96.7°F | Resp 18 | Ht 64.0 in | Wt 170.0 lb

## 2016-02-29 DIAGNOSIS — Z79899 Other long term (current) drug therapy: Secondary | ICD-10-CM | POA: Insufficient documentation

## 2016-02-29 DIAGNOSIS — C782 Secondary malignant neoplasm of pleura: Secondary | ICD-10-CM | POA: Diagnosis not present

## 2016-02-29 DIAGNOSIS — Z803 Family history of malignant neoplasm of breast: Secondary | ICD-10-CM | POA: Diagnosis not present

## 2016-02-29 DIAGNOSIS — C7802 Secondary malignant neoplasm of left lung: Secondary | ICD-10-CM | POA: Diagnosis not present

## 2016-02-29 DIAGNOSIS — Z9221 Personal history of antineoplastic chemotherapy: Secondary | ICD-10-CM | POA: Diagnosis not present

## 2016-02-29 DIAGNOSIS — Z7901 Long term (current) use of anticoagulants: Secondary | ICD-10-CM | POA: Diagnosis not present

## 2016-02-29 DIAGNOSIS — C7801 Secondary malignant neoplasm of right lung: Secondary | ICD-10-CM | POA: Diagnosis not present

## 2016-02-29 DIAGNOSIS — C7951 Secondary malignant neoplasm of bone: Secondary | ICD-10-CM | POA: Diagnosis not present

## 2016-02-29 DIAGNOSIS — D72819 Decreased white blood cell count, unspecified: Secondary | ICD-10-CM | POA: Diagnosis not present

## 2016-02-29 DIAGNOSIS — C55 Malignant neoplasm of uterus, part unspecified: Secondary | ICD-10-CM | POA: Insufficient documentation

## 2016-02-29 DIAGNOSIS — M6282 Rhabdomyolysis: Secondary | ICD-10-CM | POA: Insufficient documentation

## 2016-02-29 DIAGNOSIS — I429 Cardiomyopathy, unspecified: Secondary | ICD-10-CM

## 2016-02-29 DIAGNOSIS — C775 Secondary and unspecified malignant neoplasm of intrapelvic lymph nodes: Secondary | ICD-10-CM

## 2016-02-29 DIAGNOSIS — Z86718 Personal history of other venous thrombosis and embolism: Secondary | ICD-10-CM | POA: Diagnosis not present

## 2016-02-29 NOTE — Progress Notes (Signed)
Pt reports no changes since last visit.  No concerns.  Just chest feels "tired" same as always.

## 2016-02-29 NOTE — Progress Notes (Signed)
[facility] day:  02/29/2016   Chief Complaint: Cassidy Bradley is an 55 y.o. female with metastatic uterine leiomyosarcoma who is seen for assessment on day 8 of cycle #3 single agent olaratumab [name].  HPI: The patient was last seen in the medical oncology clinic by me on 02/15/2016.  At that time, she was seen for assessment on day 8 of cycle #3 single agent olaratumab [name].  Because of ongoing issues with neutropenia, she received weekly Neupogen 480 mcg x2.  Per prior plan, she underwent PET scan on 02/26/2016.  PET scan revealed progression of numerous pulmonary nodules/metastasis and mild progression (1.2 cm with SUV 7) of right pelvic nodal metastasis.  In the right lung apex, there was a 1.3 x 1.0 cm (SUV 6.2) previously 0.9 x 0.6 cm.  In the anterior left lung was a 2.0 x 2.2 cm (SUV 12.4) previously 1.1 x 0.9 cm.  In the anterior right middle lobe was a 1.1 x 1.1 cm (SUV 2.9), previously 0.9 x 0.9 cm.  The subpleural mass along the anterior of the left upper lobe was 2.1 x 3.5 cm (not FDG avid), previoulsy 2.2 x 3.4 cm.  Symptomatically, she denies any new complaint.  She still has some tenderness in her femurs (left > right).  She denies any bone pain associated with Neupogen.  Past Medical History  Diagnosis Date  . Breast lump 2006     both breast   . Chest wall mass   . Cancer ([place])   . Uterine cancer [facility])     Past Surgical History  Procedure Laterality Date  . Cholecystectomy  2001  . Ankle surgery  2005  . Cesarean section  1993  . Back surgery  2005  . Breast biopsy Bilateral   . Colonoscopy    . Abdominal hysterectomy  01/30/15   Family History  Problem Relation Age of Onset  . Breast cancer Sister 38  . Breast cancer Maternal Aunt   . Breast cancer Maternal Grandmother 60  . Stomach cancer Maternal Aunt   . Lung cancer Maternal Grandfather   . Colon polyps Mother   . Cancer - Colon Cousin 67    first  cousin  . Lupus Father   . Esophageal cancer Maternal Uncle     Social History:  reports that she has never smoked. She has never used smokeless tobacco. She reports that she does not drink alcohol or use illicit drugs.   The patient is accompanied by her mother today.  Allergies:  Allergies  Allergen Reactions  . Pravastatin Hives and Nausea And Vomiting  . Sulfa Antibiotics Hives and Swelling  . Latex Rash  . Other Rash and Other (See Comments)    Pt states that she is allergic to spandex.   Current Medications: Current Outpatient Prescriptions  Medication Sig Dispense Refill  . lidocaine-prilocaine (EMLA) cream Apply 1 application topically as needed. Apply one hour prior to chemotherapy (Patient taking differently: Apply 1 application topically as needed (prior to accessing pts port). Pt applies one hour prior to chemotherapy.) 30 g 1  . loratadine (CLARITIN) 10 MG tablet Take 10 mg by mouth daily.    . Multiple Vitamins-Minerals (ALIVE WOMENS ENERGY) TABS Take by mouth.    . ondansetron (ZOFRAN) 8 MG tablet Take 8 mg by mouth every 8 (eight) hours as needed for nausea or vomiting.     Alveda Reasons 20 MG TABS tablet Take 1 tablet by mouth  daily with supper 90 tablet 0  . lansoprazole (PREVACID) 30 MG capsule Take 30 mg by mouth as needed. Reported on 02/29/2016    . magic mouthwash SOLN Take 5 mLs by mouth 3 (three) times daily as needed for mouth pain. Pt swishes and spits out medication. (Patient not taking: Reported on 02/29/2016) 240 mL 3  . polyethylene glycol powder (GLYCOLAX/MIRALAX) powder Take by mouth. Reported on 02/29/2016     No current facility-administered medications for this visit.   Facility-Administered Medications Ordered in Other Visits  Medication Dose Route Frequency Provider Last Rate Last Dose  . sodium chloride 0.9 % injection 10 mL  10 mL Intracatheter PRN Lequita Asal, MD        Review of Systems:  GENERAL:  Feels fine.  No fevers or sweats.  Weight  up and down 1 pound. PERFORMANCE STATUS (ECOG):  2 HEENT:  No visual changes, runny nose, sore throat, mouth sores or tenderness. Lungs: No shortness of breath or cough.  No hemoptysis. Cardiac:  No chest pain, palpitations, orthopnea, or PND. GI:  Reflux. No nausea, vomiting, diarrhea, constipation, melena, or hematochezia. GU:  No urgency, frequency, dysuria, or hematuria. Musculoskeletal:  Mild left lateral thigh pain with slight pain in right thigh.  No back pain.  No joint pain.  No muscle tenderness. Extremities: Left lower extremity edema (chronic).  Left leg feels better.  Skin:  No rashes or skin changes. Neuro:  No headache, numbness or weakness, balance or coordination issues. Endocrine:  No diabetes, thyroid issues, hot flashes or night sweats. Psych:  No mood changes, depression or anxiety. Pain:  No focal pain. Review of systems:  All other systems reviewed and found to be negative.                        Physical Exam: Blood pressure 136/96, pulse 97, temperature 96.7 F (35.9 C), temperature source Tympanic, resp. rate 18, height '5\' 4"'$  (1.626 m), weight 169 lb 15.6 oz (77.1 kg).  GENERAL:  Well developed, well nourished, sitting comfortably in the exam room in no acute distress. MENTAL STATUS:  Alert and oriented to person, place and time. HEAD:  Fine hair growth.  Normocephalic, atraumatic, face symmetric, no Cushingoid features. EYES:  Glasses.  Brown eyes.  Pupils equal round and reactive to light and accomodation.  No conjunctivitis or scleral icterus. ENT:  Oropharynx clear without lesion.  Tongue normal. Mucous membranes moist.  RESPIRATORY:  Clear to auscultation without rales, wheezes or rhonchi. CARDIOVASCULAR:  Regular rate and rhythm without murmur, rub or gallop. No JVD. ABDOMEN:  Soft, non-tender, with active bowel sounds, and no hepatosplenomegaly.  No masses. SKIN:  No rashes, ulcers or lesions. EXTREMITIES:  Minimal left lower extremity edema  (stable). LYMPH NODES: No palpable cervical, supraclavicular, axillary or inguinal adenopathy  NEUROLOGICAL: Appropriate. PSYCH:  Appropriate.  Laboratory testing: [facility] labs on 02/28/2016 included a hematocrit 36.1, hemoglobin 12.1, platelets 206,000, white count 13,200 with an [place] of 11,400.  Comprehensive metabolic panel included a creatinine of 0.67. Liver function tests included an alkaline phosphatase of 122 (39-117).    No visits with results within 3 Day(s) from this visit. Latest known visit with results is:  Hospital Outpatient Visit on 02/26/2016  Component Date Value Ref Range Status  . Glucose-Capillary 02/26/2016 93  65 - 99 mg/dL Final   Assessment:  JARELYN BAMBACH is a 55 y.o. female 55 y.o. female with stage IV uterine leiomyosarcoma. She underwent  TAH/BSO on 01/30/2015. Pathology confirmed high grade leiomyosarcoma with 2 large adjacent tumor nodules (10 cm and 5 cm) and a separate 0.5 cm serosal nodule.   Chest, abdomen, and pelvic CT scan on 02/12/2015 revealed a 1.7 x 2.3 cm right external iliac node. Pulmonary nodules were scattered throughout the lungs bilaterally. There was left internal mammary chain adenopathy measuring up to 3.6 x 2 cm.  She underwent anterior chest wall pleural-based CT guided biopsy on 02/21/2015. Pathology was consistent with metastatic leiomyosarcoma. Bone scan on 02/28/2015 revealed no evidence of metastatic disease with indeterminate uptake in the left posterior rib.   She has a fraternal twin who developed breast cancer at age 72. Several other family members have malignancies. 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  Prechemotherapy labs from [facility] revealed leukopenia (WBC 2300 with ANC 900). Work-up to date is negative. She has a family history of leukopenia. Bone marrow on 03/08/2015 revealed no evidence of neoplasia or metastatic disease.  Marrow was normocellular for age (30-50%) with trilineage hematopoiesis. There was no increase in marrow reticulin fibers. Flow cytometry and cytogenetics were normal.  She received 4 cycles of gemcitabine and Taxotere (03/20/2015 - 05/22/2015) with Neulasta support. She received gemcitabine alone on 05/22/2015.  She was admitted on 05/24/2015 with an acute febrile illness. All cultures were negative.   Chest CT on 05/25/2015 revealed early progressive disease. Abdominal and pelvic CT scan on 05/30/2015 revealed stable to slightly increased right external iliac lymph node (1.8 x 2.5 cm).   Echocardiogram on 06/18/2015 revealed EF of 60-65%.  Echo on 10/22/2015 revealed an EF of 65%.  MUGA on 12/25/2015 revealed an EF of 52%.  Bilateral lower extremity duplex on 05/30/2015 was negative.  Left upper extremity duplex on 06/08/2015 was negative.  Left lower extremity duplex on 07/19/2015 documented a DVT.   Left lower extremity duplex on 01/31/2016 revealed re-cannulization of the prior DVT involving the left popliteal vein.  She is on Xarelto.   She received 8 cycles of adriamycin (06/07/2015 - 12/06/2015) with Neulasta (On-Pro) support.  Cycle #3 was complicated by an acute  left lower extremity DVT.  Cycle #6 was postponed a week secondary to neutropenia (ANC 700).  Zinecard was added with cycle #7.  Olaratumab Drucilla Schmidt) was added with cycle #8.  Chest, abdomen, and pelvic CT scan on 08/07/2015 revealed a partial treatment response with decrease anterior left upper pleural metastasis, stable to decreased pulmonary metastases and decreased right external iliac adenopathy. There were no new sites of disease.  Chest, abdomen, and pelvic CT scan on 10/22/2015 revealed mild decrease in left anterior chest wall mass/internal mammary lymphadenopathy (3.1 x 4.6 cm to 2.5 x 4.4 cm).  There was decreased mild right external iliac lymphadenopathy (1.4 cm to 1.2 cm).  There was diffuse stable bilateral  pulmonary metastases (largest 1.1 cm in anterior right lung).  There was no new or progressive metastatic disease.  Chest CT on 12/25/2015 on revealed slight progression in pulmonary nodules (index nodules: 5 mm to 11 mm and 8 mm to 11 mm).  The dominant soft tissue lesion along the left hemi-thorax was 2.2 x 4.3 cm (stable).  She received 3 cycles of single agent olaratumab Drucilla Schmidt) (12/27/2015 -02/08/2016).  She required GCSF secondary to neutropenia.  PET scan on 02/26/2016 revealed progression of numerous pulmonary nodules/metastasis and mild progression (1.2 cm with SUV 7) of right pelvic nodal metastasis.  In the right lung apex, there was a 1.3  x 1.0 cm (SUV 6.2) previously 0.9 x 0.6 cm.  In the anterior left lung was a 2.0 x 2.2 cm (SUV 12.4) previously 1.1 x 0.9 cm.  In the anterior right middle lobe was a 1.1 x 1.1 cm (SUV 2.9), previously 0.9 x 0.9 cm.  The subpleural mass along the anterior of the left upper lobe was 2.1 x 3.5 cm (not FDG avid), previoulsy 2.2 x 3.4 cm.  Symptomatically, she feels fairly good.  She continues to have mild discomfort in her femurs (left > right).  She has a history of chronic neutropenia preceding treatment.  Plan: 1.  Review PET scan and documentation of progressive disease.  Discuss discontinuation of Lartruvo.  Discuss trabectedin (Yondelis) every 3 weeks via 24 hour continuous infusion with Neulasta support.  Discuss side effects associated with chemotherapy including myelosuppression, palmar-plantar erythrodysesthesia, nausea/vomiting, increased liver function tests, increased CPK (rhabdomyolysis), electrolyte issues (low phosphorus), cardiomyopathy (when given with doxorubicin), and extravasation issues.  Discuss infusion pump with outpatient administration or 24 hour inpatient stay.  Information provided. 2.  Discuss follow-up with [facility] regarding potential clinical trials or other recommendations. 3.  Review [facility] labs. 4.   Discontinue Lartruvo. 5.  Schedule bone scan and echo. 6.  Preauth Yondelis 1.5 mg/m2 via continuous infusion over 24 hours every 3 weeks with Neulasta support. 7.  Contact [facility] clinic. 8.  Continue Xarelto. 9.  RTC for MD assessment, review of imaging studies, and cycle #1 Yondelis.   Lequita Asal, MD 02/29/2016, 10:29 AM

## 2016-03-03 ENCOUNTER — Ambulatory Visit
Admission: RE | Admit: 2016-03-03 | Discharge: 2016-03-03 | Disposition: A | Discharge status: Home / Self Care | Payer: 59 | Source: Ambulatory Visit | Attending: Hematology and Oncology | Admitting: Hematology and Oncology

## 2016-03-03 DIAGNOSIS — C55 Malignant neoplasm of uterus, part unspecified: Secondary | ICD-10-CM | POA: Diagnosis not present

## 2016-03-03 NOTE — Progress Notes (Signed)
*  PRELIMINARY RESULTS* Echocardiogram 2D Echocardiogram has been performed.  [name] 03/03/2016, 11:31 AM

## 2016-03-04 ENCOUNTER — Ambulatory Visit: Payer: 59

## 2016-03-04 ENCOUNTER — Encounter: Payer: Self-pay | Admitting: Hematology and Oncology

## 2016-03-05 ENCOUNTER — Encounter
Admission: RE | Admit: 2016-03-05 | Discharge: 2016-03-05 | Disposition: A | Discharge status: Home / Self Care | Payer: 59 | Source: Ambulatory Visit | Attending: Hematology and Oncology | Admitting: Hematology and Oncology

## 2016-03-05 DIAGNOSIS — C55 Malignant neoplasm of uterus, part unspecified: Secondary | ICD-10-CM | POA: Diagnosis present

## 2016-03-05 MED ORDER — TECHNETIUM TC 99M MEDRONATE IV KIT
21.7160 | PACK | Freq: Once | INTRAVENOUS | Status: AC | PRN
  Administered 2016-03-05: 21.716 via INTRAVENOUS

## 2016-03-12 ENCOUNTER — Other Ambulatory Visit: Payer: Self-pay | Admitting: Hematology and Oncology

## 2016-03-12 DIAGNOSIS — C55 Malignant neoplasm of uterus, part unspecified: Secondary | ICD-10-CM

## 2016-03-13 ENCOUNTER — Inpatient Hospital Stay: Payer: 59

## 2016-03-13 ENCOUNTER — Inpatient Hospital Stay (HOSPITAL_BASED_OUTPATIENT_CLINIC_OR_DEPARTMENT_OTHER): Payer: 59 | Admitting: Hematology and Oncology

## 2016-03-13 DIAGNOSIS — C7802 Secondary malignant neoplasm of left lung: Secondary | ICD-10-CM

## 2016-03-13 DIAGNOSIS — C775 Secondary and unspecified malignant neoplasm of intrapelvic lymph nodes: Secondary | ICD-10-CM

## 2016-03-13 DIAGNOSIS — Z9221 Personal history of antineoplastic chemotherapy: Secondary | ICD-10-CM

## 2016-03-13 DIAGNOSIS — D72819 Decreased white blood cell count, unspecified: Secondary | ICD-10-CM

## 2016-03-13 DIAGNOSIS — C782 Secondary malignant neoplasm of pleura: Secondary | ICD-10-CM

## 2016-03-13 DIAGNOSIS — C55 Malignant neoplasm of uterus, part unspecified: Secondary | ICD-10-CM | POA: Diagnosis not present

## 2016-03-13 DIAGNOSIS — Z79899 Other long term (current) drug therapy: Secondary | ICD-10-CM

## 2016-03-13 DIAGNOSIS — C7801 Secondary malignant neoplasm of right lung: Secondary | ICD-10-CM

## 2016-03-13 DIAGNOSIS — M6282 Rhabdomyolysis: Secondary | ICD-10-CM

## 2016-03-13 DIAGNOSIS — I429 Cardiomyopathy, unspecified: Secondary | ICD-10-CM

## 2016-03-13 DIAGNOSIS — Z803 Family history of malignant neoplasm of breast: Secondary | ICD-10-CM

## 2016-03-13 DIAGNOSIS — C7951 Secondary malignant neoplasm of bone: Secondary | ICD-10-CM

## 2016-03-13 LAB — CBC WITH DIFFERENTIAL/PLATELET
Basophils Absolute: 0 10*3/uL (ref 0–0.1)
Basophils Relative: 1 %
Eosinophils Absolute: 0.1 10*3/uL (ref 0–0.7)
Eosinophils Relative: 8 %
HCT: 35.9 % (ref 35.0–47.0)
Hemoglobin: 12.3 g/dL (ref 12.0–16.0)
Lymphocytes Relative: 34 %
Lymphs Abs: 0.6 10*3/uL — ABNORMAL LOW (ref 1.0–3.6)
MCH: 30.8 pg (ref 26.0–34.0)
MCHC: 34.2 g/dL (ref 32.0–36.0)
MCV: 90.2 fL (ref 80.0–100.0)
Monocytes Absolute: 0.2 10*3/uL (ref 0.2–0.9)
Monocytes Relative: 12 %
Neutro Abs: 0.9 10*3/uL — ABNORMAL LOW (ref 1.4–6.5)
Neutrophils Relative %: 45 %
Platelets: 227 10*3/uL (ref 150–440)
RBC: 3.98 MIL/uL (ref 3.80–5.20)
RDW: 13.7 % (ref 11.5–14.5)
WBC: 1.9 10*3/uL — ABNORMAL LOW (ref 3.6–11.0)

## 2016-03-13 LAB — COMPREHENSIVE METABOLIC PANEL
ALT: 26 U/L (ref 14–54)
AST: 30 U/L (ref 15–41)
Albumin: 4 g/dL (ref 3.5–5.0)
Alkaline Phosphatase: 99 U/L (ref 38–126)
Anion gap: 6 (ref 5–15)
BUN: 12 mg/dL (ref 6–20)
CO2: 26 mmol/L (ref 22–32)
Calcium: 9.4 mg/dL (ref 8.9–10.3)
Chloride: 103 mmol/L (ref 101–111)
Creatinine, Ser: 0.48 mg/dL (ref 0.44–1.00)
GFR calc Af Amer: >60 mL/min (ref >60)
GFR calc non Af Amer: >60 mL/min (ref >60)
Glucose, Bld: 92 mg/dL (ref 65–99)
Potassium: 3.7 mmol/L (ref 3.5–5.1)
Sodium: 135 mmol/L (ref 135–145)
Total Bilirubin: 0.6 mg/dL (ref 0.3–1.2)
Total Protein: 7.5 g/dL (ref 6.5–8.1)

## 2016-03-13 MED ORDER — PALONOSETRON HCL INJECTION 0.25 MG/5ML
0.2500 mg | Freq: Once | INTRAVENOUS | Status: AC
  Administered 2016-03-13: 0.25 mg via INTRAVENOUS
  Filled 2016-03-13: qty 5

## 2016-03-13 MED ORDER — TRABECTEDIN CHEMO INJECTION 1 MG IV
1.5000 mg/m2 | Freq: Once | INTRAVENOUS | Status: AC
  Administered 2016-03-13: 2.8 mg via INTRAVENOUS
  Filled 2016-03-13: qty 56

## 2016-03-13 MED ORDER — HEPARIN SOD (PORK) LOCK FLUSH 100 UNIT/ML IV SOLN: 500.0000 [IU] | Freq: Once | INTRAVENOUS | Status: DC

## 2016-03-13 MED ORDER — SODIUM CHLORIDE 0.9% FLUSH
10.0000 mL | INTRAVENOUS | Status: DC | PRN
  Administered 2016-03-13: 10 mL via INTRAVENOUS
  Filled 2016-03-13: qty 10

## 2016-03-13 MED ORDER — SODIUM CHLORIDE 0.9 % IV SOLN
INTRAVENOUS | Status: DC
  Administered 2016-03-13: 14:00:00 via INTRAVENOUS
  Filled 2016-03-13: qty 1000

## 2016-03-13 MED ORDER — HEPARIN SOD (PORK) LOCK FLUSH 100 UNIT/ML IV SOLN
500.0000 [IU] | Freq: Once | INTRAVENOUS | Status: DC | PRN
  Filled 2016-03-13: qty 5

## 2016-03-13 MED ORDER — SODIUM CHLORIDE 0.9 % IV SOLN
20.0000 mg | Freq: Once | INTRAVENOUS | Status: AC
  Administered 2016-03-13: 20 mg via INTRAVENOUS
  Filled 2016-03-13: qty 2

## 2016-03-13 NOTE — Progress Notes (Signed)
Patient is here for cycle #1 Yondelis today.  She has noticed a slight nasal congestion with drainage that causes a cough that is worse in the morning.

## 2016-03-13 NOTE — Progress Notes (Signed)
ANC =0.9.  Per MD, patients neutrophils always in this range.  Going to treat regardless (even though package insert states to not treat if >1.5) because this is patients last resort.

## 2016-03-14 ENCOUNTER — Other Ambulatory Visit: Payer: Self-pay | Admitting: Hematology and Oncology

## 2016-03-14 ENCOUNTER — Inpatient Hospital Stay: Payer: 59

## 2016-03-14 VITALS — BP 104/72 | HR 93 | Temp 97.2°F

## 2016-03-14 DIAGNOSIS — C801 Malignant (primary) neoplasm, unspecified: Secondary | ICD-10-CM

## 2016-03-14 DIAGNOSIS — C55 Malignant neoplasm of uterus, part unspecified: Secondary | ICD-10-CM

## 2016-03-14 MED ORDER — PEGFILGRASTIM 6 MG/0.6ML ~~LOC~~ PSKT
6.0000 mg | PREFILLED_SYRINGE | Freq: Once | SUBCUTANEOUS | Status: AC
Start: 2016-03-14 — End: 2016-03-14
  Administered 2016-03-14: 6 mg via SUBCUTANEOUS
  Filled 2016-03-14: qty 0.6

## 2016-03-14 MED ORDER — SODIUM CHLORIDE 0.9% FLUSH
10.0000 mL | INTRAVENOUS | Status: DC | PRN
  Administered 2016-03-14: 10 mL via INTRAVENOUS
  Filled 2016-03-14: qty 10

## 2016-03-14 MED ORDER — HEPARIN SOD (PORK) LOCK FLUSH 100 UNIT/ML IV SOLN
500.0000 [IU] | Freq: Once | INTRAVENOUS | Status: AC
  Administered 2016-03-14: 500 [IU] via INTRAVENOUS

## 2016-03-17 ENCOUNTER — Encounter: Payer: Self-pay | Admitting: Hematology and Oncology

## 2016-03-17 NOTE — Progress Notes (Signed)
[facility]-  Cancer Center  Clinic day:  03/13/2016  Chief Complaint: Cassidy Bradley is an 55 y.o. female with metastatic uterine leiomyosarcoma who is seen for assessment prior to cycle #1 trabectedin (Yondelis).  HPI: The patient was last seen in the medical oncology clinic on 02/15/2016.  At that time, PET scan from 02/26/2016 was reviewed.  PET scan revealed progressive disease.  We discussed 4th line therapy with Yondelis.  Side effects were reviewed.  I discussed a follow-up echo and bone scan given her ongoing femur pain (right > left).    After her last visit, I spoke with Dr. Waymon Amato at the [facility] sarcoma clinic.  Recommendations were also for Yondelis.  There was a possible upcoming clinic trial with an mTOR inhibitor versus pazopanib (Votrient).  Echo on 03/03/2016 revealed an EF of 50%.  Bone scan on 03/05/2016 revealed no evidence of metastatic disease.  Symptomatically, she denies any new complaint.  She still has minimal tenderness in her femurs (left > right).  She is eating well.  Past Medical History  Diagnosis Date  . Breast lump 2006     both breast   . Chest wall mass   . Cancer (HCC)   . Uterine cancer [facility])     Past Surgical History  Procedure Laterality Date  . Cholecystectomy  2001  . Ankle surgery  2005  . Cesarean section  1993  . Back surgery  2005  . Breast biopsy Bilateral   . Colonoscopy    . Abdominal hysterectomy  01/30/15   Family History  Problem Relation Age of Onset  . Breast cancer Sister 65  . Breast cancer Maternal Aunt   . Breast cancer Maternal Grandmother 60  . Stomach cancer Maternal Aunt   . Lung cancer Maternal Grandfather   . Colon polyps Mother   . Cancer - Colon Cousin 70    first cousin  . Lupus Father   . Esophageal cancer Maternal Uncle     Social History:  reports that she has never smoked. She has never used smokeless tobacco. She reports that she does not drink alcohol or use illicit drugs.   The  patient is accompanied by her husband today.  Allergies:  Allergies  Allergen Reactions  . Pravastatin Hives and Nausea And Vomiting  . Sulfa Antibiotics Hives and Swelling  . Latex Rash  . Other Rash and Other (See Comments)    Pt states that she is allergic to spandex.   Current Medications: Current Outpatient Prescriptions  Medication Sig Dispense Refill  . lansoprazole (PREVACID) 30 MG capsule Take 30 mg by mouth as needed. Reported on 02/29/2016    . lidocaine-prilocaine (EMLA) cream Apply 1 application topically as needed. Apply one hour prior to chemotherapy (Patient taking differently: Apply 1 application topically as needed (prior to accessing pts port). Pt applies one hour prior to chemotherapy.) 30 g 1  . loratadine (CLARITIN) 10 MG tablet Take 10 mg by mouth daily.    . Multiple Vitamins-Minerals (ALIVE WOMENS ENERGY) TABS Take by mouth.    . ondansetron (ZOFRAN) 8 MG tablet Take 8 mg by mouth every 8 (eight) hours as needed for nausea or vomiting.     . polyethylene glycol powder (GLYCOLAX/MIRALAX) powder Take by mouth. Reported on 02/29/2016    . XARELTO 20 MG TABS tablet Take 1 tablet by mouth  daily with supper 90 tablet 0   No current facility-administered medications for this visit.   Facility-Administered Medications Ordered in  Other Visits  Medication Dose Route Frequency Provider Last Rate Last Dose  . 0.9 %  sodium chloride infusion   Intravenous Continuous Cassidy Asal, MD   Stopped at 03/13/16 1534  . heparin lock flush 100 unit/mL  500 Units Intravenous Once Cassidy Asal, MD   500 Units at 03/13/16 1629  . heparin lock flush 100 unit/mL  500 Units Intracatheter Once PRN Cassidy Asal, MD      . sodium chloride 0.9 % injection 10 mL  10 mL Intracatheter PRN Cassidy Asal, MD      . sodium chloride flush (NS) 0.9 % injection 10 mL  10 mL Intravenous PRN Cassidy Asal, MD   10 mL at 03/13/16 1200    Review of Systems:  GENERAL:  Feels  good.  No fevers or sweats.  Weight up and down 1 pound. PERFORMANCE STATUS (ECOG):  2 HEENT:  No visual changes, runny nose, sore throat, mouth sores or tenderness. Lungs: No shortness of breath or cough.  No hemoptysis. Cardiac:  No chest pain, palpitations, orthopnea, or PND. GI:  Reflux. No nausea, vomiting, diarrhea, constipation, melena, or hematochezia. GU:  No urgency, frequency, dysuria, or hematuria. Musculoskeletal:  Mild thigh pain (left > right).  No back pain.  No joint pain.  No muscle tenderness. Extremities: Left lower extremity edema (chronic).  Left leg feels better.  Skin:  No rashes or skin changes. Neuro:  No headache, numbness or weakness, balance or coordination issues. Endocrine:  No diabetes, thyroid issues, hot flashes or night sweats. Psych:  No mood changes, depression or anxiety. Pain:  No focal pain. Review of systems:  All other systems reviewed and found to be negative.                        Physical Exam: There were no vitals taken for this visit.  GENERAL:  Well developed, well nourished, sitting comfortably in the exam room in no acute distress. MENTAL STATUS:  Alert and oriented to person, place and time. HEAD:  Fine short graying hair.  Normocephalic, atraumatic, face symmetric, no Cushingoid features. EYES:  Glasses.  Brown eyes.  Pupils equal round and reactive to light and accomodation.  No conjunctivitis or scleral icterus. ENT:  Oropharynx clear without lesion.  Tongue normal. Mucous membranes moist.  RESPIRATORY:  Clear to auscultation without rales, wheezes or rhonchi. CARDIOVASCULAR:  Regular rate and rhythm without murmur, rub or gallop. No JVD. ABDOMEN:  Soft, non-tender, with active bowel sounds, and no hepatosplenomegaly.  No masses. SKIN:  No rashes, ulcers or lesions. EXTREMITIES:  Minimal left lower extremity edema (stable). LYMPH NODES: No palpable cervical, supraclavicular, axillary or inguinal adenopathy  NEUROLOGICAL:  Appropriate. PSYCH:  Appropriate.  Laboratory testing: [facility] labs on 02/28/2016 included a hematocrit 36.1, hemoglobin 12.1, platelets 206,000, white count 13,200 with an Sandoval of 11,400.  Comprehensive metabolic panel included a creatinine of 0.67. Liver function tests included an alkaline phosphatase of 122 (39-117).    [facility] labs on 03/12/2016 included a comprehensive metabolic panel included a creatinine of 0.57.  Liver function tests included an alkaline phosphatase of 118 (39-117) post Neupogen.  Phosphorus was 3.4.  Creatinine kinase was 156 (24-173).  Infusion on 03/13/2016  Component Date Value Ref Range Status  . WBC 03/13/2016 1.9* 3.6 - 11.0 K/uL Final  . RBC 03/13/2016 3.98  3.80 - 5.20 MIL/uL Final  . Hemoglobin 03/13/2016 12.3  12.0 - 16.0 g/dL Final  .  HCT 03/13/2016 35.9  35.0 - 47.0 % Final  . MCV 03/13/2016 90.2  80.0 - 100.0 fL Final  . MCH 03/13/2016 30.8  26.0 - 34.0 pg Final  . MCHC 03/13/2016 34.2  32.0 - 36.0 g/dL Final  . RDW 03/13/2016 13.7  11.5 - 14.5 % Final  . Platelets 03/13/2016 227  150 - 440 K/uL Final  . Neutrophils Relative % 03/13/2016 45   Final  . Neutro Abs 03/13/2016 0.9* 1.4 - 6.5 K/uL Final  . Lymphocytes Relative 03/13/2016 34   Final  . Lymphs Abs 03/13/2016 0.6* 1.0 - 3.6 K/uL Final  . Monocytes Relative 03/13/2016 12   Final  . Monocytes Absolute 03/13/2016 0.2  0.2 - 0.9 K/uL Final  . Eosinophils Relative 03/13/2016 8   Final  . Eosinophils Absolute 03/13/2016 0.1  0 - 0.7 K/uL Final  . Basophils Relative 03/13/2016 1   Final  . Basophils Absolute 03/13/2016 0.0  0 - 0.1 K/uL Final  . Sodium 03/13/2016 135  135 - 145 mmol/L Final  . Potassium 03/13/2016 3.7  3.5 - 5.1 mmol/L Final  . Chloride 03/13/2016 103  101 - 111 mmol/L Final  . CO2 03/13/2016 26  22 - 32 mmol/L Final  . Glucose, Bld 03/13/2016 92  65 - 99 mg/dL Final  . BUN 03/13/2016 12  6 - 20 mg/dL Final  . Creatinine, Ser 03/13/2016 0.48  0.44 - 1.00 mg/dL Final  .  Calcium 03/13/2016 9.4  8.9 - 10.3 mg/dL Final  . Total Protein 03/13/2016 7.5  6.5 - 8.1 g/dL Final  . Albumin 03/13/2016 4.0  3.5 - 5.0 g/dL Final  . AST 03/13/2016 30  15 - 41 U/L Final  . ALT 03/13/2016 26  14 - 54 U/L Final  . Alkaline Phosphatase 03/13/2016 99  38 - 126 U/L Final  . Total Bilirubin 03/13/2016 0.6  0.3 - 1.2 mg/dL Final  . GFR calc non Af Amer 03/13/2016 >60  >60 mL/min Final  . GFR calc Af Amer 03/13/2016 >60  >60 mL/min Final   Comment: (NOTE) The eGFR has been calculated using the CKD EPI equation. This calculation has not been validated in all clinical situations. eGFR's persistently <60 mL/min signify possible Chronic Kidney Disease.   Georgiann Hahn gap 03/13/2016 6  5 - 15 Final   Assessment:  YELINA SARRATT is a 55 y.o. female 55 y.o. female with stage IV uterine leiomyosarcoma. She underwent TAH/BSO on 01/30/2015. Pathology confirmed high grade leiomyosarcoma with 2 large adjacent tumor nodules (10 cm and 5 cm) and a separate 0.5 cm serosal nodule.   Chest, abdomen, and pelvic CT scan on 02/12/2015 revealed a 1.7 x 2.3 cm right external iliac node. Pulmonary nodules were scattered throughout the lungs bilaterally. There was left internal mammary chain adenopathy measuring up to 3.6 x 2 cm.  She underwent anterior chest wall pleural-based CT guided biopsy on 02/21/2015. Pathology was consistent with metastatic leiomyosarcoma. Bone scan on 02/28/2015 revealed no evidence of metastatic disease with indeterminate uptake in the left posterior rib.   She has a fraternal twin who developed breast cancer at age 9. Several other family members have malignancies. 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  Prechemotherapy labs from [facility] revealed leukopenia (WBC 2300 with ANC 900). Work-up to date is negative. She has a family history of leukopenia. Bone marrow on 03/08/2015 revealed no  evidence of neoplasia or metastatic disease. Marrow was normocellular for age (  30-50%) with trilineage hematopoiesis. There was no increase in marrow reticulin fibers. Flow cytometry and cytogenetics were normal.  She received 4 cycles of gemcitabine and Taxotere (03/20/2015 - 05/22/2015) with Neulasta support. She received gemcitabine alone on 05/22/2015.  She was admitted on 05/24/2015 with an acute febrile illness. All cultures were negative.   Chest CT on 05/25/2015 revealed early progressive disease. Abdominal and pelvic CT scan on 05/30/2015 revealed stable to slightly increased right external iliac lymph node (1.8 x 2.5 cm).   Echocardiogram on 06/18/2015 revealed EF of 60-65%.  Echo on 10/22/2015 revealed an EF of 65%.  MUGA on 12/25/2015 revealed an EF of 52%.  Echo on 03/03/2016 revealed an EF of 50%.   Bilateral lower extremity duplex on 05/30/2015 was negative.  Left upper extremity duplex on 06/08/2015 was negative.  Left lower extremity duplex on 07/19/2015 documented a DVT.   Left lower extremity duplex on 01/31/2016 revealed re-cannulization of the prior DVT involving the left popliteal vein.  She is on Xarelto.   She received 8 cycles of adriamycin (06/07/2015 - 12/06/2015) with Neulasta (On-Pro) support.  Cycle #3 was complicated by an acute  left lower extremity DVT.  Cycle #6 was postponed a week secondary to neutropenia ([place] 700).  Zinecard was added with cycle #7.  Olaratumab [name] was added with cycle #8.  Chest, abdomen, and pelvic CT scan on 08/07/2015 revealed a partial treatment response with decrease anterior left upper pleural metastasis, stable to decreased pulmonary metastases and decreased right external iliac adenopathy. There were no new sites of disease.  Chest, abdomen, and pelvic CT scan on 10/22/2015 revealed mild decrease in left anterior chest wall mass/internal mammary lymphadenopathy (3.1 x 4.6 cm to 2.5 x 4.4 cm).  There was decreased mild right  external iliac lymphadenopathy (1.4 cm to 1.2 cm).  There was diffuse stable bilateral pulmonary metastases (largest 1.1 cm in anterior right lung).  There was no new or progressive metastatic disease.  Chest CT on 12/25/2015 on revealed slight progression in pulmonary nodules (index nodules: 5 mm to 11 mm and 8 mm to 11 mm).  The dominant soft tissue lesion along the left hemi-thorax was 2.2 x 4.3 cm (stable).  She received 3 cycles of single agent olaratumab [name] (12/27/2015 -02/08/2016).  She required GCSF secondary to neutropenia.  PET scan on 02/26/2016 revealed progression of numerous pulmonary nodules/metastasis and mild progression (1.2 cm with SUV 7) of right pelvic nodal metastasis.  In the right lung apex, there was a 1.3 x 1.0 cm (SUV 6.2) previously 0.9 x 0.6 cm.  In the anterior left lung was a 2.0 x 2.2 cm (SUV 12.4) previously 1.1 x 0.9 cm.  In the anterior right middle lobe was a 1.1 x 1.1 cm (SUV 2.9), previously 0.9 x 0.9 cm.  The subpleural mass along the anterior of the left upper lobe was 2.1 x 3.5 cm (not FDG avid), previoulsy 2.2 x 3.4 cm.  Bone scan on 03/05/2016 revealed no evidence of metastatic disease.  Symptomatically, she feels well.  She has mild discomfort in her femurs (left > right).  She has a history of chronic neutropenia preceding treatment.  Plan: 1.  Review echo and bone scan. 2.  Review conversation with [facility]. 3.  Review chemotherapy plan with trabectedin (Yondelis) every 3 weeks via 24 hour continuous infusion with On-Pro Neulasta support.  Discuss side effects associated with chemotherapy including myelosuppression, palmar-plantar erythrodysesthesia, nausea/vomiting, increased liver function tests, increased CPK (rhabdomyolysis), electrolyte issues (low phosphorus), cardiomyopathy,  and extravasation issues.  Discuss infusion pump with outpatient administration.  Discuss echo every 2 months. 4.  Review [facility] labs.  No CBC performed.  Once CBC back,  discussed baseline neutropenia and prior response to GCSF and Neulasta.  Discuss myelosuppression associated with Yondelis.  Discuss conversation with pharmacy.  Discuss limited options.  Discuss proceeding with full dose Yondelis and possible neutropenia despite Neulasta.  Patient consented to treatment.  She understands the risks versus potential benefit. 5.  Discontinue Lartruvo. 6.  Cycle #1 Yondelis 1.5 mg/m2 via continuous infusion over 24 hours. 7.  RTC in 24 hours for disconnect and On-Pro Neulasta support. 8.  RTC on 03/24/2016 for MD assessment and labs (CBC with diff, CMP, CPK). 9.  RTC on 04/03/2016 for MD assessment, review of [facility] labs (CBC with diff, CMP, Mg, phos, CPK), and cycle #2 Yondelis.  Addendum:  Extensive discussions were held with the patient today regarding direction of therapy, side effects of planned therapy, and issues regarding her baseline blood counts.   Total time spent was over 50 minutes.   Cassidy Asal, MD 03/13/2016

## 2016-03-24 ENCOUNTER — Inpatient Hospital Stay (HOSPITAL_BASED_OUTPATIENT_CLINIC_OR_DEPARTMENT_OTHER): Payer: 59 | Admitting: Hematology and Oncology

## 2016-03-24 ENCOUNTER — Other Ambulatory Visit: Payer: 59

## 2016-03-24 ENCOUNTER — Inpatient Hospital Stay: Payer: 59

## 2016-03-24 VITALS — BP 118/80 | HR 91 | Temp 97.5°F | Resp 20 | Wt 167.1 lb

## 2016-03-24 DIAGNOSIS — C782 Secondary malignant neoplasm of pleura: Secondary | ICD-10-CM | POA: Diagnosis not present

## 2016-03-24 DIAGNOSIS — C55 Malignant neoplasm of uterus, part unspecified: Secondary | ICD-10-CM

## 2016-03-24 DIAGNOSIS — Z9221 Personal history of antineoplastic chemotherapy: Secondary | ICD-10-CM

## 2016-03-24 DIAGNOSIS — C7801 Secondary malignant neoplasm of right lung: Secondary | ICD-10-CM | POA: Diagnosis not present

## 2016-03-24 DIAGNOSIS — I429 Cardiomyopathy, unspecified: Secondary | ICD-10-CM

## 2016-03-24 DIAGNOSIS — Z79899 Other long term (current) drug therapy: Secondary | ICD-10-CM

## 2016-03-24 DIAGNOSIS — C7951 Secondary malignant neoplasm of bone: Secondary | ICD-10-CM

## 2016-03-24 DIAGNOSIS — D72819 Decreased white blood cell count, unspecified: Secondary | ICD-10-CM

## 2016-03-24 DIAGNOSIS — M6282 Rhabdomyolysis: Secondary | ICD-10-CM

## 2016-03-24 DIAGNOSIS — Z803 Family history of malignant neoplasm of breast: Secondary | ICD-10-CM

## 2016-03-24 DIAGNOSIS — C7802 Secondary malignant neoplasm of left lung: Secondary | ICD-10-CM

## 2016-03-24 DIAGNOSIS — C775 Secondary and unspecified malignant neoplasm of intrapelvic lymph nodes: Secondary | ICD-10-CM

## 2016-03-24 LAB — CBC WITH DIFFERENTIAL/PLATELET
Basophils Absolute: 0 10*3/uL (ref 0–0.1)
Basophils Relative: 0 %
Eosinophils Absolute: 0 10*3/uL (ref 0–0.7)
Eosinophils Relative: 1 %
HCT: 35.7 % (ref 35.0–47.0)
Hemoglobin: 12.1 g/dL (ref 12.0–16.0)
Lymphocytes Relative: 26 %
Lymphs Abs: 0.6 10*3/uL — ABNORMAL LOW (ref 1.0–3.6)
MCH: 30.5 pg (ref 26.0–34.0)
MCHC: 33.9 g/dL (ref 32.0–36.0)
MCV: 90 fL (ref 80.0–100.0)
Monocytes Absolute: 0.3 10*3/uL (ref 0.2–0.9)
Monocytes Relative: 13 %
Neutro Abs: 1.4 10*3/uL (ref 1.4–6.5)
Neutrophils Relative %: 60 %
Platelets: 157 10*3/uL (ref 150–440)
RBC: 3.97 MIL/uL (ref 3.80–5.20)
RDW: 13.6 % (ref 11.5–14.5)
WBC: 2.4 10*3/uL — ABNORMAL LOW (ref 3.6–11.0)

## 2016-03-24 LAB — COMPREHENSIVE METABOLIC PANEL
ALT: 142 U/L — ABNORMAL HIGH (ref 14–54)
AST: 109 U/L — ABNORMAL HIGH (ref 15–41)
Albumin: 4 g/dL (ref 3.5–5.0)
Alkaline Phosphatase: 114 U/L (ref 38–126)
Anion gap: 6 (ref 5–15)
BUN: 11 mg/dL (ref 6–20)
CO2: 26 mmol/L (ref 22–32)
Calcium: 8.8 mg/dL — ABNORMAL LOW (ref 8.9–10.3)
Chloride: 104 mmol/L (ref 101–111)
Creatinine, Ser: 0.69 mg/dL (ref 0.44–1.00)
GFR calc Af Amer: >60 mL/min (ref >60)
GFR calc non Af Amer: >60 mL/min (ref >60)
Glucose, Bld: 93 mg/dL (ref 65–99)
Potassium: 3.4 mmol/L — ABNORMAL LOW (ref 3.5–5.1)
Sodium: 136 mmol/L (ref 135–145)
Total Bilirubin: 0.7 mg/dL (ref 0.3–1.2)
Total Protein: 7.4 g/dL (ref 6.5–8.1)

## 2016-03-24 LAB — CK: Total CK: 82 U/L (ref 38–234)

## 2016-03-24 NOTE — Progress Notes (Signed)
[facility] day:  03/24/2016  Chief Complaint: Cassidy Bradley is an 55 y.o. female with metastatic uterine leiomyosarcoma who is seen for assessment on day 12 s/p cycle #1 trabectedin (Yondelis).  HPI: The patient was last seen in the medical oncology clinic on 03/13/2016.  At that time, she was doing well.  She received cycle #1 trabectedin.  ANC was 900.  She received On-Pro Neulasta at disconnect.  She notes a tough week.  She describes taking care of her husband.  She had some diarrhea on Saturday, 03/22/2016, for which she took Imodium x 1.  Appetite decreased, but is now returning.  Weight is down 2 pounds.  She denies any bone pain associated with Neulasta.  She took Claritin.  Past Medical History  Diagnosis Date  . Breast lump 2006     both breast   . Chest wall mass   . Cancer ([place])   . Uterine cancer [facility])     Past Surgical History  Procedure Laterality Date  . Cholecystectomy  2001  . Ankle surgery  2005  . Cesarean section  1993  . Back surgery  2005  . Breast biopsy Bilateral   . Colonoscopy    . Abdominal hysterectomy  01/30/15   Family History  Problem Relation Age of Onset  . Breast cancer Sister 41  . Breast cancer Maternal Aunt   . Breast cancer Maternal Grandmother 60  . Stomach cancer Maternal Aunt   . Lung cancer Maternal Grandfather   . Colon polyps Mother   . Cancer - Colon Cousin 73    first cousin  . Lupus Father   . Esophageal cancer Maternal Uncle     Social History:  reports that she has never smoked. She has never used smokeless tobacco. She reports that she does not drink alcohol or use illicit drugs.   The patient is accompanied by her husband today.  Allergies:  Allergies  Allergen Reactions  . Pravastatin Hives and Nausea And Vomiting  . Sulfa Antibiotics Hives and Swelling  . Latex Rash  . Other Rash and Other (See Comments)    Pt states that she is allergic to spandex.   Current  Medications: Current Outpatient Prescriptions  Medication Sig Dispense Refill  . lansoprazole (PREVACID) 30 MG capsule Take 30 mg by mouth as needed. Reported on 02/29/2016    . lidocaine-prilocaine (EMLA) cream Apply 1 application topically as needed. Apply one hour prior to chemotherapy (Patient taking differently: Apply 1 application topically as needed (prior to accessing pts port). Pt applies one hour prior to chemotherapy.) 30 g 1  . loratadine (CLARITIN) 10 MG tablet Take 10 mg by mouth daily.    . Multiple Vitamins-Minerals (ALIVE WOMENS ENERGY) TABS Take by mouth.    . ondansetron (ZOFRAN) 8 MG tablet Take 8 mg by mouth every 8 (eight) hours as needed for nausea or vomiting.     . polyethylene glycol powder (GLYCOLAX/MIRALAX) powder Take by mouth. Reported on 02/29/2016    . XARELTO 20 MG TABS tablet Take 1 tablet by mouth  daily with supper 90 tablet 0   No current facility-administered medications for this visit.   Facility-Administered Medications Ordered in Other Visits  Medication Dose Route Frequency Provider Last Rate Last Dose  . sodium chloride 0.9 % injection 10 mL  10 mL Intracatheter PRN Lequita Asal, MD        Review of Systems:  GENERAL:  Feels a little  tired, but overall better.  No fevers or sweats.  Weight down 2 pounds. PERFORMANCE STATUS (ECOG):  2 HEENT:  No visual changes, runny nose, sore throat, mouth sores or tenderness. Lungs: No shortness of breath or cough.  No hemoptysis. Cardiac:  No chest pain, palpitations, orthopnea, or PND. GI:  Reflux. Diarrhea on 03/22/2016.  No nausea, vomiting, constipation, melena, or hematochezia. GU:  No urgency, frequency, dysuria, or hematuria. Musculoskeletal:  Mild thigh pain (left > right), improved.  No back pain.  No joint pain.  No muscle tenderness. Extremities: Left lower extremity edema (chronic).  Left leg feels better.  Skin:  No rashes or skin changes. Neuro:  No headache, numbness or weakness, balance or  coordination issues. Endocrine:  No diabetes, thyroid issues, hot flashes or night sweats. Psych:  No mood changes, depression or anxiety. Pain:  No focal pain. Review of systems:  All other systems reviewed and found to be negative.                        Physical Exam: Blood pressure 118/80, pulse 91, temperature 97.5 F (36.4 C), temperature source Tympanic, resp. rate 20, weight 167 lb 1.7 oz (75.8 kg).  GENERAL:  Well developed, well nourished, sitting comfortably in the exam room in no acute distress. MENTAL STATUS:  Alert and oriented to person, place and time. HEAD:  Fine short graying hair.  Normocephalic, atraumatic, face symmetric, no Cushingoid features. EYES:  Glasses.  Brown eyes.  Pupils equal round and reactive to light and accomodation.  No conjunctivitis or scleral icterus. ENT:  Oropharynx clear without lesion.  Tongue normal. Mucous membranes moist.  RESPIRATORY:  Clear to auscultation without rales, wheezes or rhonchi. CARDIOVASCULAR:  Regular rate and rhythm without murmur, rub or gallop. No JVD. ABDOMEN:  Soft, non-tender, with active bowel sounds, and no hepatosplenomegaly.  No masses. SKIN:  No rashes, ulcers or lesions. EXTREMITIES:  Minimal left lower extremity edema (stable). LYMPH NODES: No palpable cervical, supraclavicular, axillary or inguinal adenopathy  NEUROLOGICAL: Appropriate. PSYCH:  Appropriate.  Laboratory testing: [facility] labs on 02/28/2016 included a hematocrit 36.1, hemoglobin 12.1, platelets 206,000, white count 13,200 with an [place] of 11,400.  Comprehensive metabolic panel included a creatinine of 0.67. Liver function tests included an alkaline phosphatase of 122 (39-117).    [facility] labs on 03/12/2016 included a comprehensive metabolic panel included a creatinine of 0.57.  Liver function tests included an alkaline phosphatase of 118 (39-117) post Neupogen.  Phosphorus was 3.4.  Creatinine kinase was 156 (24-173).  Appointment on 03/24/2016   Component Date Value Ref Range Status  . WBC 03/24/2016 2.4* 3.6 - 11.0 K/uL Final  . RBC 03/24/2016 3.97  3.80 - 5.20 MIL/uL Final  . Hemoglobin 03/24/2016 12.1  12.0 - 16.0 g/dL Final  . HCT 03/24/2016 35.7  35.0 - 47.0 % Final  . MCV 03/24/2016 90.0  80.0 - 100.0 fL Final  . MCH 03/24/2016 30.5  26.0 - 34.0 pg Final  . MCHC 03/24/2016 33.9  32.0 - 36.0 g/dL Final  . RDW 03/24/2016 13.6  11.5 - 14.5 % Final  . Platelets 03/24/2016 157  150 - 440 K/uL Final  . Neutrophils Relative % 03/24/2016 60   Final  . Neutro Abs 03/24/2016 1.4  1.4 - 6.5 K/uL Final  . Lymphocytes Relative 03/24/2016 26   Final  . Lymphs Abs 03/24/2016 0.6* 1.0 - 3.6 K/uL Final  . Monocytes Relative 03/24/2016 13   Final  .  Monocytes Absolute 03/24/2016 0.3  0.2 - 0.9 K/uL Final  . Eosinophils Relative 03/24/2016 1   Final  . Eosinophils Absolute 03/24/2016 0.0  0 - 0.7 K/uL Final  . Basophils Relative 03/24/2016 0   Final  . Basophils Absolute 03/24/2016 0.0  0 - 0.1 K/uL Final  . Sodium 03/24/2016 136  135 - 145 mmol/L Final  . Potassium 03/24/2016 3.4* 3.5 - 5.1 mmol/L Final  . Chloride 03/24/2016 104  101 - 111 mmol/L Final  . CO2 03/24/2016 26  22 - 32 mmol/L Final  . Glucose, Bld 03/24/2016 93  65 - 99 mg/dL Final  . BUN 87/05/5825 11  6 - 20 mg/dL Final  . Creatinine, Ser 03/24/2016 0.69  0.44 - 1.00 mg/dL Final  . Calcium 08/88/3584 8.8* 8.9 - 10.3 mg/dL Final  . Total Protein 03/24/2016 7.4  6.5 - 8.1 g/dL Final  . Albumin 46/52/0761 4.0  3.5 - 5.0 g/dL Final  . AST 91/55/0271 109* 15 - 41 U/L Final  . ALT 03/24/2016 142* 14 - 54 U/L Final  . Alkaline Phosphatase 03/24/2016 114  38 - 126 U/L Final  . Total Bilirubin 03/24/2016 0.7  0.3 - 1.2 mg/dL Final  . GFR calc non Af Amer 03/24/2016 >60  >60 mL/min Final  . GFR calc Af Amer 03/24/2016 >60  >60 mL/min Final   Comment: (NOTE) The eGFR has been calculated using the CKD EPI equation. This calculation has not been validated in all clinical  situations. eGFR's persistently <60 mL/min signify possible Chronic Kidney Disease.   . Anion gap 03/24/2016 6  5 - 15 Final  . Total CK 03/24/2016 82  38 - 234 U/L Final   Assessment:  Cassidy Bradley is a 55 y.o. female 55 y.o. female with stage IV uterine leiomyosarcoma. She underwent TAH/BSO on 01/30/2015. Pathology confirmed high grade leiomyosarcoma with 2 large adjacent tumor nodules (10 cm and 5 cm) and a separate 0.5 cm serosal nodule.   Chest, abdomen, and pelvic CT scan on 02/12/2015 revealed a 1.7 x 2.3 cm right external iliac node. Pulmonary nodules were scattered throughout the lungs bilaterally. There was left internal mammary chain adenopathy measuring up to 3.6 x 2 cm.  She underwent anterior chest wall pleural-based CT guided biopsy on 02/21/2015. Pathology was consistent with metastatic leiomyosarcoma. Bone scan on 02/28/2015 revealed no evidence of metastatic disease with indeterminate uptake in the left posterior rib.   She has a fraternal twin who developed breast cancer at age 59. Several other family members have malignancies. 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  Prechemotherapy labs from [facility] revealed leukopenia (WBC 2300 with ANC 900). Work-up to date is negative. She has a family history of leukopenia. Bone marrow on 03/08/2015 revealed no evidence of neoplasia or metastatic disease. Marrow was normocellular for age (30-50%) with trilineage hematopoiesis. There was no increase in marrow reticulin fibers. Flow cytometry and cytogenetics were normal.  She received 4 cycles of gemcitabine and Taxotere (03/20/2015 - 05/22/2015) with Neulasta support. She received gemcitabine alone on 05/22/2015.  She was admitted on 05/24/2015 with an acute febrile illness. All cultures were negative.   Chest CT on 05/25/2015 revealed early progressive disease. Abdominal and pelvic CT  scan on 05/30/2015 revealed stable to slightly increased right external iliac lymph node (1.8 x 2.5 cm).   Echocardiogram on 06/18/2015 revealed EF of 60-65%.  Echo on 10/22/2015 revealed an EF of 65%.  MUGA on 12/25/2015 revealed  an EF of 52%.  Echo on 03/03/2016 revealed an EF of 50%.   Bilateral lower extremity duplex on 05/30/2015 was negative.  Left upper extremity duplex on 06/08/2015 was negative.  Left lower extremity duplex on 07/19/2015 documented a DVT.   Left lower extremity duplex on 01/31/2016 revealed re-cannulization of the prior DVT involving the left popliteal vein.  She is on Xarelto.   She received 8 cycles of adriamycin (06/07/2015 - 12/06/2015) with Neulasta (On-Pro) support.  Cycle #3 was complicated by an acute  left lower extremity DVT.  Cycle #6 was postponed a week secondary to neutropenia ([place] 700).  Zinecard was added with cycle #7.  Olaratumab [name] was added with cycle #8.  Chest, abdomen, and pelvic CT scan on 08/07/2015 revealed a partial treatment response with decrease anterior left upper pleural metastasis, stable to decreased pulmonary metastases and decreased right external iliac adenopathy. There were no new sites of disease.  Chest, abdomen, and pelvic CT scan on 10/22/2015 revealed mild decrease in left anterior chest wall mass/internal mammary lymphadenopathy (3.1 x 4.6 cm to 2.5 x 4.4 cm).  There was decreased mild right external iliac lymphadenopathy (1.4 cm to 1.2 cm).  There was diffuse stable bilateral pulmonary metastases (largest 1.1 cm in anterior right lung).  There was no new or progressive metastatic disease.  Chest CT on 12/25/2015 on revealed slight progression in pulmonary nodules (index nodules: 5 mm to 11 mm and 8 mm to 11 mm).  The dominant soft tissue lesion along the left hemi-thorax was 2.2 x 4.3 cm (stable).  She received 3 cycles of single agent olaratumab [name] (12/27/2015 -02/08/2016).  She required GCSF secondary to  neutropenia.  PET scan on 02/26/2016 revealed progression of numerous pulmonary nodules/metastasis and mild progression (1.2 cm with SUV 7) of right pelvic nodal metastasis.  In the right lung apex, there was a 1.3 x 1.0 cm (SUV 6.2) previously 0.9 x 0.6 cm.  In the anterior left lung was a 2.0 x 2.2 cm (SUV 12.4) previously 1.1 x 0.9 cm.  In the anterior right middle lobe was a 1.1 x 1.1 cm (SUV 2.9), previously 0.9 x 0.9 cm.  The subpleural mass along the anterior of the left upper lobe was 2.1 x 3.5 cm (not FDG avid), previoulsy 2.2 x 3.4 cm.  Bone scan on 03/05/2016 revealed no evidence of metastatic disease.  She is currently day 12 s/p cycle #1 trabectedin (Yondelis) (03/13/2016) with Neulasta support.  Pretreatment ANC was 900.  Symptomatically, she has a little fatigue.  Exam is stable.  ANC is adequate (1400).  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  Plan: 1.  Labs today:  CBC with diff, CMP, CPK. 2.  Review fever and neutropenia precautions. 3.  Anticipate echo every 2 months. 4.  RTC on 04/03/2016 for MD assessment, review of [facility] labs (CBC with diff, CMP, Mg, phos, CPK), and cycle #2 Yondelis.   Lequita Asal, MD 03/24/2016

## 2016-03-24 NOTE — Progress Notes (Signed)
Patient here today for ongoing follow up, patient reports diarrhea and weakness over the weekend. Patient reports diarrhea improved with Immodium and states she is tolerating more PO at this time.

## 2016-03-30 ENCOUNTER — Encounter: Payer: Self-pay | Admitting: Hematology and Oncology

## 2016-04-02 ENCOUNTER — Encounter: Payer: Self-pay | Admitting: Hematology and Oncology

## 2016-04-03 ENCOUNTER — Other Ambulatory Visit: Payer: Self-pay | Admitting: Hematology and Oncology

## 2016-04-03 ENCOUNTER — Inpatient Hospital Stay: Payer: 59 | Attending: Hematology and Oncology | Admitting: Hematology and Oncology

## 2016-04-03 ENCOUNTER — Inpatient Hospital Stay: Payer: 59

## 2016-04-03 VITALS — BP 130/79 | HR 111 | Temp 97.8°F | Wt 170.6 lb

## 2016-04-03 DIAGNOSIS — Z7689 Persons encountering health services in other specified circumstances: Secondary | ICD-10-CM | POA: Diagnosis not present

## 2016-04-03 DIAGNOSIS — C775 Secondary and unspecified malignant neoplasm of intrapelvic lymph nodes: Secondary | ICD-10-CM

## 2016-04-03 DIAGNOSIS — Z86718 Personal history of other venous thrombosis and embolism: Secondary | ICD-10-CM | POA: Diagnosis not present

## 2016-04-03 DIAGNOSIS — D72819 Decreased white blood cell count, unspecified: Secondary | ICD-10-CM

## 2016-04-03 DIAGNOSIS — C7801 Secondary malignant neoplasm of right lung: Secondary | ICD-10-CM

## 2016-04-03 DIAGNOSIS — C55 Malignant neoplasm of uterus, part unspecified: Secondary | ICD-10-CM

## 2016-04-03 DIAGNOSIS — C7951 Secondary malignant neoplasm of bone: Secondary | ICD-10-CM

## 2016-04-03 DIAGNOSIS — C801 Malignant (primary) neoplasm, unspecified: Secondary | ICD-10-CM

## 2016-04-03 DIAGNOSIS — Z7901 Long term (current) use of anticoagulants: Secondary | ICD-10-CM | POA: Diagnosis not present

## 2016-04-03 DIAGNOSIS — C7802 Secondary malignant neoplasm of left lung: Secondary | ICD-10-CM

## 2016-04-03 DIAGNOSIS — Z79899 Other long term (current) drug therapy: Secondary | ICD-10-CM

## 2016-04-03 DIAGNOSIS — C782 Secondary malignant neoplasm of pleura: Secondary | ICD-10-CM | POA: Diagnosis not present

## 2016-04-03 DIAGNOSIS — Z5111 Encounter for antineoplastic chemotherapy: Secondary | ICD-10-CM | POA: Diagnosis not present

## 2016-04-03 LAB — HEPATIC FUNCTION PANEL
ALT: 69 U/L — ABNORMAL HIGH (ref 14–54)
AST: 48 U/L — ABNORMAL HIGH (ref 15–41)
Albumin: 3.9 g/dL (ref 3.5–5.0)
Alkaline Phosphatase: 126 U/L (ref 38–126)
Bilirubin, Direct: 0.1 mg/dL (ref 0.1–0.5)
Indirect Bilirubin: 0.5 mg/dL (ref 0.3–0.9)
Total Bilirubin: 0.6 mg/dL (ref 0.3–1.2)
Total Protein: 7.4 g/dL (ref 6.5–8.1)

## 2016-04-03 MED ORDER — SODIUM CHLORIDE 0.9 % IV SOLN
20.0000 mg | Freq: Once | INTRAVENOUS | Status: AC
  Administered 2016-04-03: 20 mg via INTRAVENOUS
  Filled 2016-04-03: qty 2

## 2016-04-03 MED ORDER — SODIUM CHLORIDE 0.9% FLUSH
10.0000 mL | INTRAVENOUS | Status: AC | PRN
  Administered 2016-04-03: 10 mL
  Filled 2016-04-03: qty 10

## 2016-04-03 MED ORDER — PALONOSETRON HCL INJECTION 0.25 MG/5ML
0.2500 mg | Freq: Once | INTRAVENOUS | Status: AC
  Administered 2016-04-03: 0.25 mg via INTRAVENOUS
  Filled 2016-04-03: qty 5

## 2016-04-03 MED ORDER — SODIUM CHLORIDE 0.9 % IV SOLN
Freq: Once | INTRAVENOUS | Status: AC
  Administered 2016-04-03: 13:00:00 via INTRAVENOUS
  Filled 2016-04-03: qty 1000

## 2016-04-03 MED ORDER — HEPARIN SOD (PORK) LOCK FLUSH 100 UNIT/ML IV SOLN
500.0000 [IU] | Freq: Once | INTRAVENOUS | Status: AC | PRN
  Filled 2016-04-03: qty 5

## 2016-04-03 MED ORDER — TRABECTEDIN CHEMO INJECTION 1 MG IV
1.5000 mg/m2 | Freq: Once | INTRAVENOUS | Status: AC
  Administered 2016-04-03: 2.8 mg via INTRAVENOUS
  Filled 2016-04-03: qty 56

## 2016-04-03 NOTE — Progress Notes (Signed)
[facility] day:  04/03/2016  Chief Complaint: Cassidy Bradley is an 55 y.o. female with metastatic uterine leiomyosarcoma who is seen for prior to cycle #2 trabectedin (Yondelis).  HPI: The patient was last seen in the medical oncology clinic on 03/24/2016.  At that time, she was seen on day 12 s/p cycle #1 trabectedin.  Symptomatically, she had a little fatigue.  Exam was stable.  ANC was adequate (1400).  She had grade I liver toxicity  During the interim,  She had flu like symptoms secondary to the Neulasta.  Symptoms lasted for 1-2 days.  She notes a change in taste sensation.  Energy level is good.  She denies any pain.   Past Medical History  Diagnosis Date  . Breast lump 2006     both breast   . Chest wall mass   . Cancer ([place])   . Uterine cancer [facility])     Past Surgical History  Procedure Laterality Date  . Cholecystectomy  2001  . Ankle surgery  2005  . Cesarean section  1993  . Back surgery  2005  . Breast biopsy Bilateral   . Colonoscopy    . Abdominal hysterectomy  01/30/15   Family History  Problem Relation Age of Onset  . Breast cancer Sister 77  . Breast cancer Maternal Aunt   . Breast cancer Maternal Grandmother 60  . Stomach cancer Maternal Aunt   . Lung cancer Maternal Grandfather   . Colon polyps Mother   . Cancer - Colon Cousin 7    first cousin  . Lupus Father   . Esophageal cancer Maternal Uncle     Social History:  reports that she has never smoked. She has never used smokeless tobacco. She reports that she does not drink alcohol or use illicit drugs.   The patient is alone today.  Allergies:  Allergies  Allergen Reactions  . Pravastatin Hives and Nausea And Vomiting  . Sulfa Antibiotics Hives and Swelling  . Latex Rash  . Other Rash and Other (See Comments)    Pt states that she is allergic to spandex.   Current Medications: Current Outpatient Prescriptions  Medication Sig Dispense Refill   . lidocaine-prilocaine (EMLA) cream Apply 1 application topically as needed. Apply one hour prior to chemotherapy (Patient taking differently: Apply 1 application topically as needed (prior to accessing pts port). Pt applies one hour prior to chemotherapy.) 30 g 1  . loratadine (CLARITIN) 10 MG tablet Take 10 mg by mouth daily.    . Multiple Vitamins-Minerals (ALIVE WOMENS ENERGY) TABS Take by mouth.    . ondansetron (ZOFRAN) 8 MG tablet Take 8 mg by mouth every 8 (eight) hours as needed for nausea or vomiting.     . polyethylene glycol powder (GLYCOLAX/MIRALAX) powder Take by mouth. Reported on 02/29/2016    . XARELTO 20 MG TABS tablet Take 1 tablet by mouth  daily with supper 90 tablet 0  . lansoprazole (PREVACID) 30 MG capsule Take 30 mg by mouth as needed. Reported on 04/03/2016     No current facility-administered medications for this visit.   Facility-Administered Medications Ordered in Other Visits  Medication Dose Route Frequency Provider Last Rate Last Dose  . sodium chloride 0.9 % injection 10 mL  10 mL Intracatheter PRN Lequita Asal, MD        Review of Systems:  GENERAL:  Feels"pretty good".  No fevers or sweats.  Weight up 1 pound. PERFORMANCE  STATUS (ECOG):  2 HEENT:  Change in taste.  Allergies.  No visual changes, runny nose, sore throat, mouth sores or tenderness. Lungs: No shortness of breath or cough.  No hemoptysis. Cardiac:  No chest pain, palpitations, orthopnea, or PND. GI:  Reflux.  No nausea, vomiting, diarrhea, constipation, melena, or hematochezia. GU:  No urgency, frequency, dysuria, or hematuria. Musculoskeletal:  Very little thigh pain.  No back pain.  No joint pain.  No muscle tenderness. Extremities: Left lower extremity edema (chronic).  Skin:  No rashes or skin changes. Neuro:  No headache, numbness or weakness, balance or coordination issues. Endocrine:  No diabetes, thyroid issues, hot flashes or night sweats. Psych:  No mood changes, depression or  anxiety. Pain:  No focal pain. Review of systems:  All other systems reviewed and found to be negative.                        Physical Exam: Blood pressure 130/79, pulse 111, temperature 97.8 F (36.6 C), temperature source Tympanic, weight 170 lb 10.2 oz (77.4 kg), SpO2 99 %.  GENERAL:  Well developed, well nourished, sitting comfortably in the exam room in no acute distress. MENTAL STATUS:  Alert and oriented to person, place and time. HEAD:  Short graying hair.  Normocephalic, atraumatic, face symmetric, no Cushingoid features. EYES:  Glasses.  Brown eyes.  Pupils equal round and reactive to light and accomodation.  No conjunctivitis or scleral icterus. ENT:  Oropharynx clear without lesion.  Tongue normal. Mucous membranes moist.  RESPIRATORY:  Clear to auscultation without rales, wheezes or rhonchi. CARDIOVASCULAR:  Regular rate and rhythm without murmur, rub or gallop. No JVD. ABDOMEN:  Soft, non-tender, with active bowel sounds, and no hepatosplenomegaly.  No masses. SKIN:  No rashes, ulcers or lesions. EXTREMITIES:  Minimal left lower extremity edema (stable). LYMPH NODES: No palpable cervical, supraclavicular, axillary or inguinal adenopathy  NEUROLOGICAL: Appropriate. PSYCH:  Appropriate.  Laboratory testing: [facility] labs on 02/28/2016 included a hematocrit 36.1, hemoglobin 12.1, platelets 206,000, white count 13,200 with an [place] of 11,400.  Comprehensive metabolic panel included a creatinine of 0.67. Liver function tests included an alkaline phosphatase of 122 (39-117).    [facility] labs on 03/12/2016 included a comprehensive metabolic panel included a creatinine of 0.57.  Liver function tests included an alkaline phosphatase of 118 (39-117) post Neupogen.  Phosphorus was 3.4.  Creatinine kinase was 156 (24-173).  [facility] labs on 04/02/2016 included hematocrit of 33.3, hemoglobin 11.0, platelets 229,000, WBC 2700 with an ANC of 1400.  Comprehensive metabolic panel included a  creatinine of 0.61.  Liver function tests included an alkaline phosphatase of 129 (39-117), AST 54 (0-40) and ALT 76 (0-32).  Phosphorus was 3.9.  Creatinine kinase was 120 (24-173).  No visits with results within 3 Day(s) from this visit. Latest known visit with results is:  Appointment on 03/24/2016  Component Date Value Ref Range Status  . WBC 03/24/2016 2.4* 3.6 - 11.0 K/uL Final  . RBC 03/24/2016 3.97  3.80 - 5.20 MIL/uL Final  . Hemoglobin 03/24/2016 12.1  12.0 - 16.0 g/dL Final  . HCT 03/24/2016 35.7  35.0 - 47.0 % Final  . MCV 03/24/2016 90.0  80.0 - 100.0 fL Final  . MCH 03/24/2016 30.5  26.0 - 34.0 pg Final  . MCHC 03/24/2016 33.9  32.0 - 36.0 g/dL Final  . RDW 03/24/2016 13.6  11.5 - 14.5 % Final  . Platelets 03/24/2016 157  150 - 440 K/uL  Final  . Neutrophils Relative % 03/24/2016 60   Final  . Neutro Abs 03/24/2016 1.4  1.4 - 6.5 K/uL Final  . Lymphocytes Relative 03/24/2016 26   Final  . Lymphs Abs 03/24/2016 0.6* 1.0 - 3.6 K/uL Final  . Monocytes Relative 03/24/2016 13   Final  . Monocytes Absolute 03/24/2016 0.3  0.2 - 0.9 K/uL Final  . Eosinophils Relative 03/24/2016 1   Final  . Eosinophils Absolute 03/24/2016 0.0  0 - 0.7 K/uL Final  . Basophils Relative 03/24/2016 0   Final  . Basophils Absolute 03/24/2016 0.0  0 - 0.1 K/uL Final  . Sodium 03/24/2016 136  135 - 145 mmol/L Final  . Potassium 03/24/2016 3.4* 3.5 - 5.1 mmol/L Final  . Chloride 03/24/2016 104  101 - 111 mmol/L Final  . CO2 03/24/2016 26  22 - 32 mmol/L Final  . Glucose, Bld 03/24/2016 93  65 - 99 mg/dL Final  . BUN 03/24/2016 11  6 - 20 mg/dL Final  . Creatinine, Ser 03/24/2016 0.69  0.44 - 1.00 mg/dL Final  . Calcium 03/24/2016 8.8* 8.9 - 10.3 mg/dL Final  . Total Protein 03/24/2016 7.4  6.5 - 8.1 g/dL Final  . Albumin 03/24/2016 4.0  3.5 - 5.0 g/dL Final  . AST 03/24/2016 109* 15 - 41 U/L Final  . ALT 03/24/2016 142* 14 - 54 U/L Final  . Alkaline Phosphatase 03/24/2016 114  38 - 126 U/L Final   . Total Bilirubin 03/24/2016 0.7  0.3 - 1.2 mg/dL Final  . GFR calc non Af Amer 03/24/2016 >60  >60 mL/min Final  . GFR calc Af Amer 03/24/2016 >60  >60 mL/min Final   Comment: (NOTE) The eGFR has been calculated using the CKD EPI equation. This calculation has not been validated in all clinical situations. eGFR's persistently <60 mL/min signify possible Chronic Kidney Disease.   . Anion gap 03/24/2016 6  5 - 15 Final  . Total CK 03/24/2016 82  38 - 234 U/L Final   Assessment:  Cassidy Bradley is a 55 y.o. female 55 y.o. female with stage IV uterine leiomyosarcoma. She underwent TAH/BSO on 01/30/2015. Pathology confirmed high grade leiomyosarcoma with 2 large adjacent tumor nodules (10 cm and 5 cm) and a separate 0.5 cm serosal nodule.   Chest, abdomen, and pelvic CT scan on 02/12/2015 revealed a 1.7 x 2.3 cm right external iliac node. Pulmonary nodules were scattered throughout the lungs bilaterally. There was left internal mammary chain adenopathy measuring up to 3.6 x 2 cm.  She underwent anterior chest wall pleural-based CT guided biopsy on 02/21/2015. Pathology was consistent with metastatic leiomyosarcoma. Bone scan on 02/28/2015 revealed no evidence of metastatic disease with indeterminate uptake in the left posterior rib.   She has a fraternal twin who developed breast cancer at age 22. Several other family members have malignancies. 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  Prechemotherapy labs from [facility] revealed leukopenia (WBC 2300 with ANC 900). Work-up to date is negative. She has a family history of leukopenia. Bone marrow on 03/08/2015 revealed no evidence of neoplasia or metastatic disease. Marrow was normocellular for age (30-50%) with trilineage hematopoiesis. There was no increase in marrow reticulin fibers. Flow cytometry and cytogenetics were normal.  She received 4 cycles of  gemcitabine and Taxotere (03/20/2015 - 05/22/2015) with Neulasta support. She received gemcitabine alone on 05/22/2015.  She was admitted on 05/24/2015 with an acute febrile illness. All cultures were negative.  Chest CT on 05/25/2015 revealed early progressive disease. Abdominal and pelvic CT scan on 05/30/2015 revealed stable to slightly increased right external iliac lymph node (1.8 x 2.5 cm).   Echocardiogram on 06/18/2015 revealed EF of 60-65%.  Echo on 10/22/2015 revealed an EF of 65%.  MUGA on 12/25/2015 revealed an EF of 52%.  Echo on 03/03/2016 revealed an EF of 50%.   Bilateral lower extremity duplex on 05/30/2015 was negative.  Left upper extremity duplex on 06/08/2015 was negative.  Left lower extremity duplex on 07/19/2015 documented a DVT.   Left lower extremity duplex on 01/31/2016 revealed re-cannulization of the prior DVT involving the left popliteal vein.  She is on Xarelto.   She received 8 cycles of adriamycin (06/07/2015 - 12/06/2015) with Neulasta (On-Pro) support.  Cycle #3 was complicated by an acute  left lower extremity DVT.  Cycle #6 was postponed a week secondary to neutropenia (Deercroft 700).  Zinecard was added with cycle #7.  Olaratumab [name] was added with cycle #8.  Chest, abdomen, and pelvic CT scan on 08/07/2015 revealed a partial treatment response with decrease anterior left upper pleural metastasis, stable to decreased pulmonary metastases and decreased right external iliac adenopathy. There were no new sites of disease.  Chest, abdomen, and pelvic CT scan on 10/22/2015 revealed mild decrease in left anterior chest wall mass/internal mammary lymphadenopathy (3.1 x 4.6 cm to 2.5 x 4.4 cm).  There was decreased mild right external iliac lymphadenopathy (1.4 cm to 1.2 cm).  There was diffuse stable bilateral pulmonary metastases (largest 1.1 cm in anterior right lung).  There was no new or progressive metastatic disease.  Chest CT on 12/25/2015 on revealed slight  progression in pulmonary nodules (index nodules: 5 mm to 11 mm and 8 mm to 11 mm).  The dominant soft tissue lesion along the left hemi-thorax was 2.2 x 4.3 cm (stable).  She received 3 cycles of single agent olaratumab [name] (12/27/2015 -02/08/2016).  She required GCSF secondary to neutropenia.  PET scan on 02/26/2016 revealed progression of numerous pulmonary nodules/metastasis and mild progression (1.2 cm with SUV 7) of right pelvic nodal metastasis.  In the right lung apex, there was a 1.3 x 1.0 cm (SUV 6.2) previously 0.9 x 0.6 cm.  In the anterior left lung was a 2.0 x 2.2 cm (SUV 12.4) previously 1.1 x 0.9 cm.  In the anterior right middle lobe was a 1.1 x 1.1 cm (SUV 2.9), previously 0.9 x 0.9 cm.  The subpleural mass along the anterior of the left upper lobe was 2.1 x 3.5 cm (not FDG avid), previoulsy 2.2 x 3.4 cm.  Bone scan on 03/05/2016 revealed no evidence of metastatic disease.  She is s/p cycle #1 trabectedin (Yondelis) (03/13/2016) with Neulasta support.  She had a grade I liver toxicity secondary to Yondelis.  Symptomatically, she feels good.  Exam is stable.  ANC is adequate (1400).    Plan: 1.  Review [facility] labs. 2.  Cycle #2 Yondelis. 3.  RTC in 24 hours for disconnect and OnPro Neulasta. 4.  Anticipate echo every 2 months. 5.  RTC in 3 weeks for MD assessment, review of [facility] labs (CBC with diff, CMP, Mg, phos, CPK), and cycle #3 Yondelis.   Lequita Asal, MD 04/03/2016, 11:25 AM

## 2016-04-03 NOTE — Progress Notes (Signed)
Pt here for Cycle 2 of Trabectedin. Pt states the neulasta is the only thing that makes her feel bad achy and chills. The chemo did distort her taste temporarily. Denies neuropathy. Bowels move every other day. Energy is fairly good.

## 2016-04-04 ENCOUNTER — Inpatient Hospital Stay: Payer: 59

## 2016-04-04 VITALS — BP 100/66 | HR 111 | Temp 98.1°F | Resp 18

## 2016-04-04 DIAGNOSIS — C55 Malignant neoplasm of uterus, part unspecified: Secondary | ICD-10-CM

## 2016-04-04 MED ORDER — PEGFILGRASTIM 6 MG/0.6ML ~~LOC~~ PSKT
6.0000 mg | PREFILLED_SYRINGE | Freq: Once | SUBCUTANEOUS | Status: AC
  Administered 2016-04-04: 6 mg via SUBCUTANEOUS
  Filled 2016-04-04: qty 0.6

## 2016-04-04 MED ORDER — HEPARIN SOD (PORK) LOCK FLUSH 100 UNIT/ML IV SOLN
500.0000 [IU] | Freq: Once | INTRAVENOUS | Status: AC | PRN
  Administered 2016-04-04: 500 [IU]
  Filled 2016-04-04: qty 5

## 2016-04-04 MED ORDER — SODIUM CHLORIDE 0.9% FLUSH
10.0000 mL | INTRAVENOUS | Status: DC | PRN
  Administered 2016-04-04: 10 mL
  Filled 2016-04-04: qty 10

## 2016-04-10 ENCOUNTER — Telehealth: Payer: Self-pay

## 2016-04-10 NOTE — Telephone Encounter (Signed)
Returned pt's phone call.  Pt reports having coughing spells at night time and had spoken with her insurance company nurse, and per nurse wants to see if she can obtain an inhaler.  I spoke with Dr. Mike Gip and per Dr. Mike Gip she would like her to follow up with her PCP.  I told pt to call us back if she had anymore concerns.  Pt stated she thought it was related to allergies

## 2016-04-15 ENCOUNTER — Encounter: Payer: Self-pay | Admitting: Hematology and Oncology

## 2016-04-23 ENCOUNTER — Other Ambulatory Visit: Payer: Self-pay | Admitting: Hematology and Oncology

## 2016-04-24 ENCOUNTER — Inpatient Hospital Stay: Payer: 59

## 2016-04-24 ENCOUNTER — Other Ambulatory Visit: Payer: Self-pay | Admitting: *Deleted

## 2016-04-24 ENCOUNTER — Inpatient Hospital Stay (HOSPITAL_BASED_OUTPATIENT_CLINIC_OR_DEPARTMENT_OTHER): Payer: 59 | Admitting: Hematology and Oncology

## 2016-04-24 ENCOUNTER — Other Ambulatory Visit: Payer: Self-pay | Admitting: Hematology and Oncology

## 2016-04-24 VITALS — BP 135/93 | HR 102 | Temp 97.7°F | Resp 17 | Wt 167.4 lb

## 2016-04-24 DIAGNOSIS — D72819 Decreased white blood cell count, unspecified: Secondary | ICD-10-CM

## 2016-04-24 DIAGNOSIS — C7802 Secondary malignant neoplasm of left lung: Secondary | ICD-10-CM | POA: Diagnosis not present

## 2016-04-24 DIAGNOSIS — C55 Malignant neoplasm of uterus, part unspecified: Secondary | ICD-10-CM

## 2016-04-24 DIAGNOSIS — C7801 Secondary malignant neoplasm of right lung: Secondary | ICD-10-CM

## 2016-04-24 DIAGNOSIS — C775 Secondary and unspecified malignant neoplasm of intrapelvic lymph nodes: Secondary | ICD-10-CM

## 2016-04-24 DIAGNOSIS — C782 Secondary malignant neoplasm of pleura: Secondary | ICD-10-CM | POA: Diagnosis not present

## 2016-04-24 DIAGNOSIS — C7951 Secondary malignant neoplasm of bone: Secondary | ICD-10-CM

## 2016-04-24 DIAGNOSIS — Z79899 Other long term (current) drug therapy: Secondary | ICD-10-CM

## 2016-04-24 DIAGNOSIS — I82432 Acute embolism and thrombosis of left popliteal vein: Secondary | ICD-10-CM

## 2016-04-24 DIAGNOSIS — R059 Cough, unspecified: Secondary | ICD-10-CM

## 2016-04-24 DIAGNOSIS — R079 Chest pain, unspecified: Secondary | ICD-10-CM

## 2016-04-24 DIAGNOSIS — R05 Cough: Secondary | ICD-10-CM

## 2016-04-24 MED ORDER — RIVAROXABAN 20 MG PO TABS: 20.0000 mg | ORAL_TABLET | Freq: Every day | ORAL | Status: DC

## 2016-04-24 NOTE — Progress Notes (Signed)
[facility]-  Cancer Center  Clinic day:  04/24/2016  Chief Complaint: Cassidy Bradley is an 55 y.o. female with metastatic uterine leiomyosarcoma who is seen for prior to cycle #3 trabectedin (Yondelis).  HPI: The patient was last seen in the medical oncology clinic on 04/03/2016.  At that time, she was seen received cycle #2 trabectedin.  She was feeling good.  Exam was stable.  During the interim, she states that she has felt sick for a few weeks.  She describes a cough for which she has had 2 rounds of antibiotics (Z-pack and doxycycline).  CXR was negative.  She has been using a cough suppressant.  She notes discomfort in the right side of her chest.  She denies any fevers.  She does note drainage in the back of her throat.  She feels short of breath climbing stairs.   Past Medical History  Diagnosis Date  . Breast lump 2006     both breast   . Chest wall mass   . Cancer (HCC)   . Uterine cancer [facility])     Past Surgical History  Procedure Laterality Date  . Cholecystectomy  2001  . Ankle surgery  2005  . Cesarean section  1993  . Back surgery  2005  . Breast biopsy Bilateral   . Colonoscopy    . Abdominal hysterectomy  01/30/15   Family History  Problem Relation Age of Onset  . Breast cancer Sister 62  . Breast cancer Maternal Aunt   . Breast cancer Maternal Grandmother 60  . Stomach cancer Maternal Aunt   . Lung cancer Maternal Grandfather   . Colon polyps Mother   . Cancer - Colon Cousin 12    first cousin  . Lupus Father   . Esophageal cancer Maternal Uncle     Social History:  reports that she has never smoked. She has never used smokeless tobacco. She reports that she does not drink alcohol or use illicit drugs.   The patient is accompanied by her husband today.  Allergies:  Allergies  Allergen Reactions  . Pravastatin Hives and Nausea And Vomiting  . Sulfa Antibiotics Hives and Swelling  . Latex Rash  . Other Rash and Other (See  Comments)    Pt states that she is allergic to spandex.   Current Medications: Current Outpatient Prescriptions  Medication Sig Dispense Refill  . benzonatate (TESSALON) 100 MG capsule     . doxycycline (VIBRA-TABS) 100 MG tablet Take by mouth.    Marland Kitchen guaiFENesin-codeine (ROBITUSSIN AC) 100-10 MG/5ML syrup Take by mouth.    . lansoprazole (PREVACID) 30 MG capsule Take 30 mg by mouth as needed. Reported on 04/03/2016    . lidocaine-prilocaine (EMLA) cream Apply 1 application topically as needed. Apply one hour prior to chemotherapy (Patient taking differently: Apply 1 application topically as needed (prior to accessing pts port). Pt applies one hour prior to chemotherapy.) 30 g 1  . loratadine (CLARITIN) 10 MG tablet Take 10 mg by mouth daily.    . Multiple Vitamins-Minerals (ALIVE WOMENS ENERGY) TABS Take by mouth.    . ondansetron (ZOFRAN) 8 MG tablet Take 8 mg by mouth every 8 (eight) hours as needed for nausea or vomiting.     . polyethylene glycol powder (GLYCOLAX/MIRALAX) powder Take by mouth. Reported on 02/29/2016    . XARELTO 20 MG TABS tablet Take 1 tablet by mouth  daily with supper 90 tablet 0   No current facility-administered medications for this visit.  Facility-Administered Medications Ordered in Other Visits  Medication Dose Route Frequency Provider Last Rate Last Dose  . heparin lock flush 100 unit/mL  500 Units Intracatheter Once PRN Lequita Asal, MD   500 Units at 04/03/16 1615  . sodium chloride 0.9 % injection 10 mL  10 mL Intracatheter PRN Lequita Asal, MD      . sodium chloride flush (NS) 0.9 % injection 10 mL  10 mL Intracatheter PRN Lequita Asal, MD   10 mL at 04/03/16 1241    Review of Systems:  GENERAL:  Feels a little tired, but overall better.  No fevers or sweats.  Weight down 2 pounds. PERFORMANCE STATUS (ECOG):  2 HEENT:  No visual changes, runny nose, sore throat, mouth sores or tenderness. Lungs: Shortness of breath when climbing stairs.   Dry  cough.  No hemoptysis. Cardiac:  No chest pain, palpitations, orthopnea, or PND. GI:  Reflux.  No nausea, vomiting, constipation, melena, or hematochezia. GU:  No urgency, frequency, dysuria, or hematuria. Musculoskeletal:  No bone or back pain.  No joint pain.  No muscle tenderness. Extremities: Left lower extremity edema (chronic).  Left leg feels better.  Skin:  No rashes or skin changes. Neuro:  No headache, numbness or weakness, balance or coordination issues. Endocrine:  No diabetes, thyroid issues, hot flashes or night sweats. Psych:  No mood changes, depression or anxiety. Pain:  No focal pain. Review of systems:  All other systems reviewed and found to be negative.                        Physical Exam: Blood pressure 135/93, pulse 102, temperature 97.7 F (36.5 C), temperature source Tympanic, resp. rate 17, weight 167 lb 7 oz (75.95 kg). GENERAL:  Well developed, well nourished, sitting comfortably in the exam room in no acute distress. MENTAL STATUS:  Alert and oriented to person, place and time. HEAD:  Short graying hair.  Normocephalic, atraumatic, face symmetric, no Cushingoid features. EYES:  Glasses.  Brown eyes.  Pupils equal round and reactive to light and accomodation.  No conjunctivitis or scleral icterus. ENT:  Oropharynx clear without lesion.  Tongue normal. Mucous membranes moist.  RESPIRATORY:  Clear to auscultation without rales, wheezes or rhonchi. CARDIOVASCULAR:  Regular rate and rhythm without murmur, rub or gallop. No JVD. ABDOMEN:  Soft, non-tender, with active bowel sounds, and no hepatosplenomegaly.  No masses. SKIN:  No rashes, ulcers or lesions. EXTREMITIES:  Minimal left lower extremity edema (stable). LYMPH NODES: No palpable cervical, supraclavicular, axillary or inguinal adenopathy  NEUROLOGICAL: Appropriate. PSYCH:  Appropriate.  Laboratory testing: [facility] labs on 02/28/2016 included a hematocrit 36.1, hemoglobin 12.1, platelets 206,000,  white count 13,200 with an [place] of 11,400.  Comprehensive metabolic panel included a creatinine of 0.67. Liver function tests included an alkaline phosphatase of 122 (39-117).    [facility] labs on 03/12/2016 included a comprehensive metabolic panel included a creatinine of 0.57.  Liver function tests included an alkaline phosphatase of 118 (39-117) post Neupogen.  Phosphorus was 3.4.  Creatinine kinase was 156 (24-173).  [facility] labs on 04/02/2016 included hematocrit of 33.3, hemoglobin 11.0, platelets 229,000, WBC 2700 with an ANC of 1400.  Comprehensive metabolic panel included a creatinine of 0.61.  Liver function tests included an alkaline phosphatase of 129 (39-117), AST 54 (0-40) and ALT 76 (0-32).  Phosphorus was 3.9.  Creatinine kinase was 120 (24-173).  [facility] labs on 04/23/2016 included hematocrit of 30.4, hemoglobin 10.0,  platelets 266,000, WBC 3200 with an ANC of 1600.  Comprehensive metabolic panel included a creatinine of 0.68.  Liver function tests included an alkaline phosphatase of 195 (39-117), AST 45 (0-40) and ALT 69 (0-32).  Phosphorus was 3.2.  Creatinine kinase was 258 (24-173).   No visits with results within 3 Day(s) from this visit. Latest known visit with results is:  Infusion on 04/03/2016  Component Date Value Ref Range Status  . Total Protein 04/03/2016 7.4  6.5 - 8.1 g/dL Final  . Albumin 04/03/2016 3.9  3.5 - 5.0 g/dL Final  . AST 04/03/2016 48* 15 - 41 U/L Final  . ALT 04/03/2016 69* 14 - 54 U/L Final  . Alkaline Phosphatase 04/03/2016 126  38 - 126 U/L Final  . Total Bilirubin 04/03/2016 0.6  0.3 - 1.2 mg/dL Final  . Bilirubin, Direct 04/03/2016 0.1  0.1 - 0.5 mg/dL Final  . Indirect Bilirubin 04/03/2016 0.5  0.3 - 0.9 mg/dL Final   Assessment:  ADRIELLA ESSEX is a 55 y.o. female 55 y.o. female with stage IV uterine leiomyosarcoma. She underwent TAH/BSO on 01/30/2015. Pathology confirmed high grade leiomyosarcoma with 2 large adjacent tumor nodules (10 cm  and 5 cm) and a separate 0.5 cm serosal nodule.   Chest, abdomen, and pelvic CT scan on 02/12/2015 revealed a 1.7 x 2.3 cm right external iliac node. Pulmonary nodules were scattered throughout the lungs bilaterally. There was left internal mammary chain adenopathy measuring up to 3.6 x 2 cm.  She underwent anterior chest wall pleural-based CT guided biopsy on 02/21/2015. Pathology was consistent with metastatic leiomyosarcoma. Bone scan on 02/28/2015 revealed no evidence of metastatic disease with indeterminate uptake in the left posterior rib.   She has a fraternal twin who developed breast cancer at age 52. Several other family members have malignancies. 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  Prechemotherapy labs from [facility] revealed leukopenia (WBC 2300 with ANC 900). Work-up to date is negative. She has a family history of leukopenia. Bone marrow on 03/08/2015 revealed no evidence of neoplasia or metastatic disease. Marrow was normocellular for age (30-50%) with trilineage hematopoiesis. There was no increase in marrow reticulin fibers. Flow cytometry and cytogenetics were normal.  She received 4 cycles of gemcitabine and Taxotere (03/20/2015 - 05/22/2015) with Neulasta support. She received gemcitabine alone on 05/22/2015.  She was admitted on 05/24/2015 with an acute febrile illness. All cultures were negative.   Chest CT on 05/25/2015 revealed early progressive disease. Abdominal and pelvic CT scan on 05/30/2015 revealed stable to slightly increased right external iliac lymph node (1.8 x 2.5 cm).   Echocardiogram on 06/18/2015 revealed EF of 60-65%.  Echo on 10/22/2015 revealed an EF of 65%.  MUGA on 12/25/2015 revealed an EF of 52%.  Echo on 03/03/2016 revealed an EF of 50%.   Bilateral lower extremity duplex on 05/30/2015 was negative.  Left upper extremity duplex on 06/08/2015 was negative.   Left lower extremity duplex on 07/19/2015 documented a DVT.   Left lower extremity duplex on 01/31/2016 revealed re-cannulization of the prior DVT involving the left popliteal vein.  She is on Xarelto.   She received 8 cycles of adriamycin (06/07/2015 - 12/06/2015) with Neulasta (On-Pro) support.  Cycle #3 was complicated by an acute  left lower extremity DVT.  Cycle #6 was postponed a week secondary to neutropenia ([place] 700).  Zinecard was added with cycle #7.  Olaratumab [name] was added with cycle #8.  Chest,  abdomen, and pelvic CT scan on 08/07/2015 revealed a partial treatment response with decrease anterior left upper pleural metastasis, stable to decreased pulmonary metastases and decreased right external iliac adenopathy. There were no new sites of disease.  Chest, abdomen, and pelvic CT scan on 10/22/2015 revealed mild decrease in left anterior chest wall mass/internal mammary lymphadenopathy (3.1 x 4.6 cm to 2.5 x 4.4 cm).  There was decreased mild right external iliac lymphadenopathy (1.4 cm to 1.2 cm).  There was diffuse stable bilateral pulmonary metastases (largest 1.1 cm in anterior right lung).  There was no new or progressive metastatic disease.  Chest CT on 12/25/2015 on revealed slight progression in pulmonary nodules (index nodules: 5 mm to 11 mm and 8 mm to 11 mm).  The dominant soft tissue lesion along the left hemi-thorax was 2.2 x 4.3 cm (stable).  She received 3 cycles of single agent olaratumab [name] (12/27/2015 -02/08/2016).  She required GCSF secondary to neutropenia.  PET scan on 02/26/2016 revealed progression of numerous pulmonary nodules/metastasis and mild progression (1.2 cm with SUV 7) of right pelvic nodal metastasis.  In the right lung apex, there was a 1.3 x 1.0 cm (SUV 6.2) previously 0.9 x 0.6 cm.  In the anterior left lung was a 2.0 x 2.2 cm (SUV 12.4) previously 1.1 x 0.9 cm.  In the anterior right middle lobe was a 1.1 x 1.1 cm (SUV 2.9), previously 0.9  x 0.9 cm.  The subpleural mass along the anterior of the left upper lobe was 2.1 x 3.5 cm (not FDG avid), previoulsy 2.2 x 3.4 cm.  Bone scan on 03/05/2016 revealed no evidence of metastatic disease.  She is s/p 2 cycles of trabectedin (Yondelis) (03/13/2016 - 04/03/2016) with Neulasta support.  She had a grade I liver toxicity secondary to Yondelis.  Symptomatically, she has a persistent cough and right sided chest pain.  Exam is stable.  CK is 1.5 x ULN.  Plan: 1.  Review [facility] labs. 2.  Discuss follow-up chest CT given known metastatic disease and current symptoms. 3.  Schedule non-contrast chest CT. 4.  Anticipate echo every 2 months. 5.  Continue Xarelto. 6.  RTC on 05/01/2016 for MD assessment, review of [facility] labs, review of chest CT, and cycle #3 Yondelis.   Lequita Asal, MD 04/24/2016, 10:23 AM

## 2016-04-24 NOTE — Progress Notes (Signed)
Pt states she had to go to walk in clinic x2 in the past 2 weeks d/t consistent cough. Per pt X-ray was taken and MD states "normal" and believes she may have "pulled muscle d/t coughing", started on abx and cough meds. Pt reports pain in R upper chest is resolving.

## 2016-04-26 ENCOUNTER — Encounter: Payer: Self-pay | Admitting: Hematology and Oncology

## 2016-04-28 ENCOUNTER — Ambulatory Visit
Admission: RE | Admit: 2016-04-28 | Discharge: 2016-04-28 | Disposition: A | Discharge status: Home / Self Care | Payer: 59 | Source: Ambulatory Visit | Attending: Hematology and Oncology | Admitting: Hematology and Oncology

## 2016-04-28 DIAGNOSIS — R05 Cough: Secondary | ICD-10-CM | POA: Insufficient documentation

## 2016-04-28 DIAGNOSIS — R079 Chest pain, unspecified: Secondary | ICD-10-CM | POA: Insufficient documentation

## 2016-04-28 DIAGNOSIS — C7800 Secondary malignant neoplasm of unspecified lung: Secondary | ICD-10-CM | POA: Diagnosis not present

## 2016-04-28 DIAGNOSIS — C55 Malignant neoplasm of uterus, part unspecified: Secondary | ICD-10-CM | POA: Diagnosis present

## 2016-04-28 DIAGNOSIS — I2510 Atherosclerotic heart disease of native coronary artery without angina pectoris: Secondary | ICD-10-CM | POA: Diagnosis not present

## 2016-04-28 DIAGNOSIS — R059 Cough, unspecified: Secondary | ICD-10-CM

## 2016-04-28 MED ORDER — IOPAMIDOL (ISOVUE-300) INJECTION 61%
75.0000 mL | Freq: Once | INTRAVENOUS | Status: AC | PRN
Start: 2016-04-28 — End: 2016-04-28
  Administered 2016-04-28: 75 mL via INTRAVENOUS

## 2016-04-30 ENCOUNTER — Encounter: Payer: Self-pay | Admitting: Hematology and Oncology

## 2016-05-01 ENCOUNTER — Inpatient Hospital Stay: Payer: 59

## 2016-05-01 ENCOUNTER — Other Ambulatory Visit: Payer: Self-pay | Admitting: Hematology and Oncology

## 2016-05-01 ENCOUNTER — Inpatient Hospital Stay: Payer: 59 | Attending: Hematology and Oncology | Admitting: Hematology and Oncology

## 2016-05-01 VITALS — BP 125/86 | HR 96 | Temp 97.0°F | Resp 18 | Ht 64.0 in | Wt 168.8 lb

## 2016-05-01 DIAGNOSIS — Z5111 Encounter for antineoplastic chemotherapy: Secondary | ICD-10-CM | POA: Insufficient documentation

## 2016-05-01 DIAGNOSIS — C775 Secondary and unspecified malignant neoplasm of intrapelvic lymph nodes: Secondary | ICD-10-CM | POA: Diagnosis not present

## 2016-05-01 DIAGNOSIS — Z7689 Persons encountering health services in other specified circumstances: Secondary | ICD-10-CM | POA: Diagnosis not present

## 2016-05-01 DIAGNOSIS — C7802 Secondary malignant neoplasm of left lung: Secondary | ICD-10-CM | POA: Diagnosis not present

## 2016-05-01 DIAGNOSIS — Z79899 Other long term (current) drug therapy: Secondary | ICD-10-CM | POA: Diagnosis not present

## 2016-05-01 DIAGNOSIS — Z7901 Long term (current) use of anticoagulants: Secondary | ICD-10-CM | POA: Diagnosis not present

## 2016-05-01 DIAGNOSIS — C55 Malignant neoplasm of uterus, part unspecified: Secondary | ICD-10-CM

## 2016-05-01 DIAGNOSIS — C7801 Secondary malignant neoplasm of right lung: Secondary | ICD-10-CM | POA: Insufficient documentation

## 2016-05-01 DIAGNOSIS — Z86718 Personal history of other venous thrombosis and embolism: Secondary | ICD-10-CM | POA: Diagnosis not present

## 2016-05-01 MED ORDER — SODIUM CHLORIDE 0.9 % IV SOLN
INTRAVENOUS | Status: AC
  Administered 2016-05-01: 12:00:00 via INTRAVENOUS
  Filled 2016-05-01: qty 1000

## 2016-05-01 MED ORDER — TRABECTEDIN CHEMO INJECTION 1 MG IV
1.5000 mg/m2 | Freq: Once | INTRAVENOUS | Status: AC
  Administered 2016-05-01: 2.8 mg via INTRAVENOUS
  Filled 2016-05-01: qty 56

## 2016-05-01 MED ORDER — HEPARIN SOD (PORK) LOCK FLUSH 100 UNIT/ML IV SOLN: 500.0000 [IU] | Freq: Once | INTRAVENOUS | Status: AC | PRN

## 2016-05-01 MED ORDER — PALONOSETRON HCL INJECTION 0.25 MG/5ML
0.2500 mg | Freq: Once | INTRAVENOUS | Status: AC
  Administered 2016-05-01: 0.25 mg via INTRAVENOUS
  Filled 2016-05-01: qty 5

## 2016-05-01 MED ORDER — SODIUM CHLORIDE 0.9% FLUSH
10.0000 mL | INTRAVENOUS | Status: AC | PRN
  Administered 2016-05-01: 10 mL
  Filled 2016-05-01: qty 10

## 2016-05-01 MED ORDER — SODIUM CHLORIDE 0.9 % IV SOLN
20.0000 mg | Freq: Once | INTRAVENOUS | Status: AC
  Administered 2016-05-01: 20 mg via INTRAVENOUS
  Filled 2016-05-01: qty 2

## 2016-05-01 NOTE — Progress Notes (Unsigned)
ANC: 0.9. MD, Dr. Mike Gip, already aware. Per MD, Dr. Mike Gip, order: proceed with chemotherapy treatment today.

## 2016-05-01 NOTE — Progress Notes (Signed)
[facility] day:  05/01/2016  Chief Complaint: Cassidy Bradley is an 56 y.o. female with metastatic uterine leiomyosarcoma who is seen for prior to cycle #3 trabectedin (Yondelis).  HPI: The patient was last seen in the medical oncology clinic on 04/24/2016.  At that time, cycle #3 was postponed secondary to unexplained chest pain.  Reimaging was ordered   Chest CT on 04/28/2016 revealed innumerable pulmonary metastases which had slightly increased (41m) in size since 02/26/2016.  During the interim, she has done well.  Her cough has improved.  She notes drainage in the back of her throat.  Her left ankle remains swollen (chronic).   Past Medical History  Diagnosis Date  . Breast lump 2006     both breast   . Chest wall mass   [place]   . Uterine cancer ([facility]     Past Surgical History  Procedure Laterality Date  . Cholecystectomy  2001  . Ankle surgery  2005  . Cesarean section  1993  . Back surgery  2005  . Breast biopsy Bilateral   . Colonoscopy    . Abdominal hysterectomy  01/30/15   Family History  Problem Relation Age of Onset  . Breast cancer Sister 368 . Breast cancer Maternal Aunt   . Breast cancer Maternal Grandmother 60  . Stomach cancer Maternal Aunt   . Lung cancer Maternal Grandfather   . Colon polyps Mother   . Cancer - Colon Cousin 513   first cousin  . Lupus Father   . Esophageal cancer Maternal Uncle     Social History:  reports that she has never smoked. She has never used smokeless tobacco. She reports that she does not drink alcohol or use illicit drugs.   The patient is accompanied by her husband today.  Allergies:  Allergies  Allergen Reactions  . Pravastatin Hives and Nausea And Vomiting  . Sulfa Antibiotics Hives and Swelling  . Latex Rash  . Other Rash and Other (See Comments)    Pt states that she is allergic to spandex.   Current Medications: Current Outpatient Prescriptions   Medication Sig Dispense Refill  . lidocaine-prilocaine (EMLA) cream Apply 1 application topically as needed. Apply one hour prior to chemotherapy (Patient taking differently: Apply 1 application topically as needed (prior to accessing pts port). Pt applies one hour prior to chemotherapy.) 30 g 1  . loratadine (CLARITIN) 10 MG tablet Take 10 mg by mouth daily.    . Multiple Vitamins-Minerals (ALIVE WOMENS ENERGY) TABS Take by mouth.    . ondansetron (ZOFRAN) 8 MG tablet Take 8 mg by mouth every 8 (eight) hours as needed for nausea or vomiting.     . rivaroxaban (XARELTO) 20 MG TABS tablet Take 1 tablet (20 mg total) by mouth daily with supper. 90 tablet 0  . polyethylene glycol powder (GLYCOLAX/MIRALAX) powder Take by mouth. Reported on 05/01/2016     No current facility-administered medications for this visit.   Facility-Administered Medications Ordered in Other Visits  Medication Dose Route Frequency Provider Last Rate Last Dose  . heparin lock flush 100 unit/mL  500 Units Intracatheter Once PRN MLequita Asal MD   500 Units at 04/03/16 1615  . sodium chloride 0.9 % injection 10 mL  10 mL Intracatheter PRN MLequita Asal MD      . sodium chloride flush (NS) 0.9 % injection 10 mL  10 mL Intracatheter PRN MLequita Asal MD  10 mL at 04/03/16 1241    Review of Systems:  GENERAL:  Feels good.  No fevers or sweats.  Weight up 1 pound. PERFORMANCE STATUS (ECOG):  2 HEENT:  Allergies.  Nasal drainage.  No visual changes,sore throat, mouth sores or tenderness. Lungs: Shortness of breath when climbing stairs.  Dry  cough.  No hemoptysis. Cardiac:  No chest pain, palpitations, orthopnea, or PND. GI:  Reflux.  No nausea, vomiting, constipation, melena, or hematochezia. GU:  No urgency, frequency, dysuria, or hematuria. Musculoskeletal:  No bone or back pain.  No joint pain.  No muscle tenderness. Extremities: Left lower extremity ankle edema (chronic).  Left leg feels better.  Skin:   No rashes or skin changes. Neuro:  No headache, numbness or weakness, balance or coordination issues. Endocrine:  No diabetes, thyroid issues, hot flashes or night sweats. Psych:  No mood changes, depression or anxiety. Pain:  No focal pain. Review of systems:  All other systems reviewed and found to be negative.                        Physical Exam: Blood pressure 125/86, pulse 96, temperature 97 F (36.1 C), temperature source Tympanic, resp. rate 18, height '5\' 4"'$  (1.626 m), weight 168 lb 12.2 oz (76.55 kg). GENERAL:  Well developed, well nourished, sitting comfortably in the exam room in no acute distress. MENTAL STATUS:  Alert and oriented to person, place and time. HEAD:  Short graying hair.  Normocephalic, atraumatic, face symmetric, no Cushingoid features. EYES:  Glasses.  Brown eyes.  Pupils equal round and reactive to light and accomodation.  No conjunctivitis or scleral icterus. ENT:  Oropharynx clear without lesion.  Tongue normal. Mucous membranes moist.  RESPIRATORY:  Clear to auscultation without rales, wheezes or rhonchi. CARDIOVASCULAR:  Regular rate and rhythm without murmur, rub or gallop. No JVD. ABDOMEN:  Soft, non-tender, with active bowel sounds, and no hepatosplenomegaly.  No masses. SKIN:  No rashes, ulcers or lesions. EXTREMITIES:  Minimal left lower extremity edema (stable). LYMPH NODES: No palpable cervical, supraclavicular, axillary or inguinal adenopathy  NEUROLOGICAL: Appropriate. PSYCH:  Appropriate.  Laboratory testing: [facility] labs on 02/28/2016 included a hematocrit 36.1, hemoglobin 12.1, platelets 206,000, white count 13,200 with an [place] of 11,400.  Comprehensive metabolic panel included a creatinine of 0.67. Liver function tests included an alkaline phosphatase of 122 (39-117).    [facility] labs on 03/12/2016 included a comprehensive metabolic panel included a creatinine of 0.57.  Liver function tests included an alkaline phosphatase of 118 (39-117) post  Neupogen.  Phosphorus was 3.4.  Creatinine kinase was 156 (24-173).  [facility] labs on 04/02/2016 included hematocrit of 33.3, hemoglobin 11.0, platelets 229,000, WBC 2700 with an ANC of 1400.  Comprehensive metabolic panel included a creatinine of 0.61.  Liver function tests included an alkaline phosphatase of 129 (39-117), AST 54 (0-40) and ALT 76 (0-32).  Phosphorus was 3.9.  Creatinine kinase was 120 (24-173).  [facility] labs on 04/23/2016 included hematocrit of 30.4, hemoglobin 10.0, platelets 266,000, WBC 3200 with an ANC of 1600.  Comprehensive metabolic panel included a creatinine of 0.68.  Liver function tests included an alkaline phosphatase of 195 (39-117), AST 45 (0-40) and ALT 69 (0-32).  Phosphorus was 3.2.  Creatinine kinase was 258 (24-173).  [facility] labs on 04/30/2016 included hematocrit of 32.0, hemoglobin 10.8, platelets 287,000, WBC 2100 with an ANC of 900.  Comprehensive metabolic panel included a creatinine of 0.68.  Liver function tests included an  alkaline phosphatase of 166 (39-117), AST 42 (0-40) and ALT 44 (0-32).  Phosphorus was 4.0.  Creatinine kinase was 182 (24-173).   No visits with results within 3 Day(s) from this visit. Latest known visit with results is:  Infusion on 04/03/2016  Component Date Value Ref Range Status  . Total Protein 04/03/2016 7.4  6.5 - 8.1 g/dL Final  . Albumin 04/03/2016 3.9  3.5 - 5.0 g/dL Final  . AST 04/03/2016 48* 15 - 41 U/L Final  . ALT 04/03/2016 69* 14 - 54 U/L Final  . Alkaline Phosphatase 04/03/2016 126  38 - 126 U/L Final  . Total Bilirubin 04/03/2016 0.6  0.3 - 1.2 mg/dL Final  . Bilirubin, Direct 04/03/2016 0.1  0.1 - 0.5 mg/dL Final  . Indirect Bilirubin 04/03/2016 0.5  0.3 - 0.9 mg/dL Final   Assessment:  Cassidy Bradley is a 55 y.o. female 55 y.o. female with stage IV uterine leiomyosarcoma. She underwent TAH/BSO on 01/30/2015. Pathology confirmed high grade leiomyosarcoma with 2 large adjacent tumor nodules (10 cm and 5  cm) and a separate 0.5 cm serosal nodule.   Chest, abdomen, and pelvic CT scan on 02/12/2015 revealed a 1.7 x 2.3 cm right external iliac node. Pulmonary nodules were scattered throughout the lungs bilaterally. There was left internal mammary chain adenopathy measuring up to 3.6 x 2 cm.  She underwent anterior chest wall pleural-based CT guided biopsy on 02/21/2015. Pathology was consistent with metastatic leiomyosarcoma. Bone scan on 02/28/2015 revealed no evidence of metastatic disease with indeterminate uptake in the left posterior rib.   She has a fraternal twin who developed breast cancer at age 14. Several other family members have malignancies. 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  Prechemotherapy labs from [facility] revealed leukopenia (WBC 2300 with ANC 900). Work-up to date is negative. She has a family history of leukopenia. Bone marrow on 03/08/2015 revealed no evidence of neoplasia or metastatic disease. Marrow was normocellular for age (30-50%) with trilineage hematopoiesis. There was no increase in marrow reticulin fibers. Flow cytometry and cytogenetics were normal.  She received 4 cycles of gemcitabine and Taxotere (03/20/2015 - 05/22/2015) with Neulasta support. She received gemcitabine alone on 05/22/2015.  She was admitted on 05/24/2015 with an acute febrile illness. All cultures were negative.   Chest CT on 05/25/2015 revealed early progressive disease. Abdominal and pelvic CT scan on 05/30/2015 revealed stable to slightly increased right external iliac lymph node (1.8 x 2.5 cm).   Echocardiogram on 06/18/2015 revealed EF of 60-65%.  Echo on 10/22/2015 revealed an EF of 65%.  MUGA on 12/25/2015 revealed an EF of 52%.  Echo on 03/03/2016 revealed an EF of 50%.   Bilateral lower extremity duplex on 05/30/2015 was negative.  Left upper extremity duplex on 06/08/2015 was negative.  Left  lower extremity duplex on 07/19/2015 documented a DVT.   Left lower extremity duplex on 01/31/2016 revealed re-cannulization of the prior DVT involving the left popliteal vein.  She is on Xarelto.   She received 8 cycles of adriamycin (06/07/2015 - 12/06/2015) with Neulasta (On-Pro) support.  Cycle #3 was complicated by an acute  left lower extremity DVT.  Cycle #6 was postponed a week secondary to neutropenia ([employer] 700).  Zinecard was added with cycle #7.  Olaratumab [name] was added with cycle #8.  Chest, abdomen, and pelvic CT scan on 08/07/2015 revealed a partial treatment response with decrease anterior left upper pleural metastasis, stable to decreased pulmonary metastases  and decreased right external iliac adenopathy. There were no new sites of disease.  Chest, abdomen, and pelvic CT scan on 10/22/2015 revealed mild decrease in left anterior chest wall mass/internal mammary lymphadenopathy (3.1 x 4.6 cm to 2.5 x 4.4 cm).  There was decreased mild right external iliac lymphadenopathy (1.4 cm to 1.2 cm).  There was diffuse stable bilateral pulmonary metastases (largest 1.1 cm in anterior right lung).  There was no new or progressive metastatic disease.  Chest CT on 12/25/2015 on revealed slight progression in pulmonary nodules (index nodules: 5 mm to 11 mm and 8 mm to 11 mm).  The dominant soft tissue lesion along the left hemi-thorax was 2.2 x 4.3 cm (stable).  She received 3 cycles of single agent olaratumab [name] (12/27/2015 -02/08/2016).  She required GCSF secondary to neutropenia.  PET scan on 02/26/2016 revealed progression of numerous pulmonary nodules/metastasis and mild progression (1.2 cm with SUV 7) of right pelvic nodal metastasis.  In the right lung apex, there was a 1.3 x 1.0 cm (SUV 6.2) previously 0.9 x 0.6 cm.  In the anterior left lung was a 2.0 x 2.2 cm (SUV 12.4) previously 1.1 x 0.9 cm.  In the anterior right middle lobe was a 1.1 x 1.1 cm (SUV 2.9), previously 0.9 x 0.9  cm.  The subpleural mass along the anterior of the left upper lobe was 2.1 x 3.5 cm (not FDG avid), previoulsy 2.2 x 3.4 cm.  Bone scan on 03/05/2016 revealed no evidence of metastatic disease.  She is s/p 2 cycles of trabectedin (Yondelis) (03/13/2016 - 04/03/2016) with Neulasta support.  She had a grade I liver toxicity secondary to Yondelis. Chest CT on 04/28/2016 revealed innumerable pulmonary metastases which had slightly increased (55m) in size since 02/26/2016.  Symptomatically, she has sinus darinage secondary to allergies.  Exam is stable.  ANC is 900.  Plan: 1.  Review [facility] labs. 2.  Discuss interval chest CT.  Given minimal increase in disease (1 mm), slice variability on scan (consider stable), and limited treatment options, discuss plans to continue treatment.  Patient in agreement. 3.  Discuss counts.  Baseline counts low.  Patient wishes to proceed with treatment with ongoing Neulasta support. 4.  Cycle #3 Yondelis. 5.  RTC in 24 hours for disconnect and OnPro Neulasta. 6.  Schedule echo in 2 1/2 weeks. 7.  Continue Xarelto. 8.  RTC in 3 weeks for MD assessment, review of [facility] labs, review of echo, and cycle #4 Yondelis.   MLequita Asal MD 05/01/2016, 11:44 AM

## 2016-05-01 NOTE — Progress Notes (Signed)
Pts left ankle remains swollen.  Cough has improved PCP treated with antibiotic and cough medication.  Cough worsens she believes and its related to being outside and allergies.  CT scan was last Monday

## 2016-05-02 ENCOUNTER — Inpatient Hospital Stay: Payer: 59

## 2016-05-02 DIAGNOSIS — C55 Malignant neoplasm of uterus, part unspecified: Secondary | ICD-10-CM

## 2016-05-02 MED ORDER — HEPARIN SOD (PORK) LOCK FLUSH 100 UNIT/ML IV SOLN
500.0000 [IU] | Freq: Once | INTRAVENOUS | Status: AC | PRN
  Administered 2016-05-02: 500 [IU]
  Filled 2016-05-02: qty 5

## 2016-05-02 MED ORDER — SODIUM CHLORIDE 0.9% FLUSH
10.0000 mL | INTRAVENOUS | Status: DC | PRN
  Administered 2016-05-02: 10 mL
  Filled 2016-05-02: qty 10

## 2016-05-02 MED ORDER — PEGFILGRASTIM 6 MG/0.6ML ~~LOC~~ PSKT
6.0000 mg | PREFILLED_SYRINGE | Freq: Once | SUBCUTANEOUS | Status: AC
  Administered 2016-05-02: 6 mg via SUBCUTANEOUS
  Filled 2016-05-02: qty 0.6

## 2016-05-13 ENCOUNTER — Encounter: Payer: Self-pay | Admitting: Hematology and Oncology

## 2016-05-21 ENCOUNTER — Ambulatory Visit
Admission: RE | Admit: 2016-05-21 | Discharge: 2016-05-21 | Disposition: A | Discharge status: Home / Self Care | Payer: 59 | Source: Ambulatory Visit | Attending: Hematology and Oncology | Admitting: Hematology and Oncology

## 2016-05-21 DIAGNOSIS — Z09 Encounter for follow-up examination after completed treatment for conditions other than malignant neoplasm: Secondary | ICD-10-CM | POA: Insufficient documentation

## 2016-05-21 DIAGNOSIS — I340 Nonrheumatic mitral (valve) insufficiency: Secondary | ICD-10-CM | POA: Diagnosis not present

## 2016-05-21 DIAGNOSIS — I519 Heart disease, unspecified: Secondary | ICD-10-CM | POA: Insufficient documentation

## 2016-05-21 DIAGNOSIS — C55 Malignant neoplasm of uterus, part unspecified: Secondary | ICD-10-CM

## 2016-05-21 DIAGNOSIS — I517 Cardiomegaly: Secondary | ICD-10-CM | POA: Diagnosis not present

## 2016-05-21 NOTE — Progress Notes (Signed)
*  PRELIMINARY RESULTS* Echocardiogram 2D Echocardiogram has been performed.  Cassidy Bradley 05/21/2016, 10:51 AM

## 2016-05-22 ENCOUNTER — Inpatient Hospital Stay (HOSPITAL_BASED_OUTPATIENT_CLINIC_OR_DEPARTMENT_OTHER): Payer: 59 | Admitting: Hematology and Oncology

## 2016-05-22 ENCOUNTER — Ambulatory Visit: Payer: 59 | Admitting: Hematology and Oncology

## 2016-05-22 ENCOUNTER — Ambulatory Visit
Admission: RE | Admit: 2016-05-22 | Discharge: 2016-05-22 | Disposition: A | Discharge status: Home / Self Care | Payer: 59 | Source: Ambulatory Visit | Attending: Hematology and Oncology | Admitting: Hematology and Oncology

## 2016-05-22 ENCOUNTER — Ambulatory Visit: Payer: 59

## 2016-05-22 ENCOUNTER — Encounter: Payer: Self-pay | Admitting: Hematology and Oncology

## 2016-05-22 ENCOUNTER — Inpatient Hospital Stay: Payer: 59

## 2016-05-22 ENCOUNTER — Other Ambulatory Visit: Payer: Self-pay | Admitting: Hematology and Oncology

## 2016-05-22 VITALS — BP 125/80 | HR 109 | Temp 96.6°F | Resp 18 | Ht 64.0 in | Wt 144.4 lb

## 2016-05-22 DIAGNOSIS — M7989 Other specified soft tissue disorders: Secondary | ICD-10-CM | POA: Diagnosis not present

## 2016-05-22 DIAGNOSIS — Z79899 Other long term (current) drug therapy: Secondary | ICD-10-CM

## 2016-05-22 DIAGNOSIS — C55 Malignant neoplasm of uterus, part unspecified: Secondary | ICD-10-CM | POA: Diagnosis not present

## 2016-05-22 DIAGNOSIS — C7801 Secondary malignant neoplasm of right lung: Secondary | ICD-10-CM | POA: Diagnosis not present

## 2016-05-22 DIAGNOSIS — C775 Secondary and unspecified malignant neoplasm of intrapelvic lymph nodes: Secondary | ICD-10-CM | POA: Diagnosis not present

## 2016-05-22 DIAGNOSIS — I82402 Acute embolism and thrombosis of unspecified deep veins of left lower extremity: Secondary | ICD-10-CM

## 2016-05-22 DIAGNOSIS — Z86718 Personal history of other venous thrombosis and embolism: Secondary | ICD-10-CM | POA: Diagnosis not present

## 2016-05-22 DIAGNOSIS — Z7689 Persons encountering health services in other specified circumstances: Secondary | ICD-10-CM

## 2016-05-22 DIAGNOSIS — C7802 Secondary malignant neoplasm of left lung: Secondary | ICD-10-CM

## 2016-05-22 DIAGNOSIS — Z7901 Long term (current) use of anticoagulants: Secondary | ICD-10-CM

## 2016-05-22 DIAGNOSIS — R748 Abnormal levels of other serum enzymes: Secondary | ICD-10-CM | POA: Insufficient documentation

## 2016-05-22 NOTE — Progress Notes (Signed)
Pt states that her left leg is still painful to tneder upon touching, still swelling some.

## 2016-05-22 NOTE — Progress Notes (Signed)
[facility] day:  05/22/2016  Chief Complaint: Cassidy Bradley is an 55 y.o. female with metastatic uterine leiomyosarcoma who is seen for prior to cycle #4 trabectedin (Yondelis).  HPI: The patient was last seen in the medical oncology clinic on 05/01/2016.  At that time, she received cycle #3 trabectedin.  Follow-up chest CT revealed stable disease (increase by 1 mm).  Echocardiogram on 05/21/2016 revealed an EF of 55%.  During the interim, she has noted more swelling in her left leg.  Sometimes she feels a little dizzy when getting up.  She continues Xarelto.  She denies any bruising or bleeding.  She denies any chest pain or shortness of breath.     Past Medical History  Diagnosis Date  . Breast lump 2006     both breast   . Chest wall mass   . Cancer ([place])   . Uterine cancer [facility] [place])     Past Surgical History  Procedure Laterality Date  . Cholecystectomy  2001  . Ankle surgery  2005  . Cesarean section  1993  . Back surgery  2005  . Breast biopsy Bilateral   . Colonoscopy    . Abdominal hysterectomy  01/30/15   Family History  Problem Relation Age of Onset  . Breast cancer Sister 9  . Breast cancer Maternal Aunt   . Breast cancer Maternal Grandmother 60  . Stomach cancer Maternal Aunt   . Lung cancer Maternal Grandfather   . Colon polyps Mother   . Cancer - Colon Cousin 57    first cousin  . Lupus Father   . Esophageal cancer Maternal Uncle     Social History:  reports that she has never smoked. She has never used smokeless tobacco. She reports that she does not drink alcohol or use illicit drugs.   The patient is alone today.  Allergies:  Allergies  Allergen Reactions  . Pravastatin Hives and Nausea And Vomiting  . Sulfa Antibiotics Hives and Swelling  . Latex Rash  . Other Rash and Other (See Comments)    Pt states that she is allergic to spandex.   Current Medications: Current Outpatient Prescriptions   Medication Sig Dispense Refill  . lidocaine-prilocaine (EMLA) cream Apply 1 application topically as needed. Apply one hour prior to chemotherapy (Patient taking differently: Apply 1 application topically as needed (prior to accessing pts port). Pt applies one hour prior to chemotherapy.) 30 g 1  . loratadine (CLARITIN) 10 MG tablet Take 10 mg by mouth daily.    . Multiple Vitamins-Minerals (ALIVE WOMENS ENERGY) TABS Take by mouth.    . ondansetron (ZOFRAN) 8 MG tablet Take 8 mg by mouth every 8 (eight) hours as needed for nausea or vomiting.     . polyethylene glycol powder (GLYCOLAX/MIRALAX) powder Take by mouth. Reported on 05/01/2016    . rivaroxaban (XARELTO) 20 MG TABS tablet Take 1 tablet (20 mg total) by mouth daily with supper. 90 tablet 0   No current facility-administered medications for this visit.   Facility-Administered Medications Ordered in Other Visits  Medication Dose Route Frequency Provider Last Rate Last Dose  . 0.9 %  sodium chloride infusion   Intravenous Continuous Lequita Asal, MD   Stopped at 05/01/16 1340  . heparin lock flush 100 unit/mL  500 Units Intracatheter Once PRN Lequita Asal, MD   500 Units at 04/03/16 1615  . heparin lock flush 100 unit/mL  500 Units Intracatheter Once PRN Melissa  Elmarie Mainland, MD   500 Units at 05/01/16 1306  . sodium chloride 0.9 % injection 10 mL  10 mL Intracatheter PRN Lequita Asal, MD      . sodium chloride flush (NS) 0.9 % injection 10 mL  10 mL Intracatheter PRN Lequita Asal, MD   10 mL at 04/03/16 1241  . sodium chloride flush (NS) 0.9 % injection 10 mL  10 mL Intracatheter PRN Lequita Asal, MD   10 mL at 05/01/16 1225    Review of Systems:  GENERAL:  Feels good.  No fevers or sweats.  PERFORMANCE STATUS (ECOG):  2 HEENT:  No visual changes,sore throat, mouth sores or tenderness. Lungs: Shortness of breath with exertion (stable).  No cough.  No hemoptysis. Cardiac:  No chest pain, palpitations,  orthopnea, or PND. GI:  Reflux.  No nausea, vomiting, constipation, melena, or hematochezia. GU:  No urgency, frequency, dysuria, or hematuria. Musculoskeletal:  No bone or back pain.  No joint pain.  No muscle tenderness. Extremities: Left lower extremity swelling, increased (shows photo), but improved over past several days. Skin:  No rashes or skin changes. Neuro:  No headache, numbness or weakness, balance or coordination issues. Endocrine:  No diabetes, thyroid issues, hot flashes or night sweats. Psych:  No mood changes, depression or anxiety. Pain:  No focal pain. Review of systems:  All other systems reviewed and found to be negative.                        Physical Exam: Blood pressure 125/80, pulse 109, temperature 96.6 F (35.9 C), temperature source Tympanic, resp. rate 18, height '5\' 4"'$  (1.626 m), weight 144 lb 6.4 oz (65.5 kg).  GENERAL:  Well developed, well nourished, sitting comfortably in the exam room in no acute distress. MENTAL STATUS:  Alert and oriented to person, place and time. HEAD:  Wearing a black cap.  Short graying hair.  Normocephalic, atraumatic, face symmetric, no Cushingoid features. EYES:  Glasses.  Brown eyes.  Pupils equal round and reactive to light and accomodation.  No conjunctivitis or scleral icterus. ENT:  Oropharynx clear without lesion.  Tongue normal. Mucous membranes moist.  RESPIRATORY:  Clear to auscultation without rales, wheezes or rhonchi. CARDIOVASCULAR:  Regular rate and rhythm without murmur, rub or gallop. No JVD. ABDOMEN:  Soft, non-tender, with active bowel sounds, and no hepatosplenomegaly.  No masses. SKIN:  No rashes, ulcers or lesions. EXTREMITIES:  1-2+ left lower extremity edema below the knee (increased). LYMPH NODES: No palpable cervical, supraclavicular, axillary or inguinal adenopathy  NEUROLOGICAL: Appropriate. PSYCH:  Appropriate.  Laboratory testing: [facility] labs on 02/28/2016 included a hematocrit 36.1, hemoglobin  12.1, platelets 206,000, white count 13,200 with an Aten of 11,400.  Comprehensive metabolic panel included a creatinine of 0.67. Liver function tests included an alkaline phosphatase of 122 (39-117).    [facility] labs on 03/12/2016 included a comprehensive metabolic panel included a creatinine of 0.57.  Liver function tests included an alkaline phosphatase of 118 (39-117) post Neupogen.  Phosphorus was 3.4.  Creatinine kinase was 156 (24-173).  [facility] labs on 04/02/2016 included hematocrit of 33.3, hemoglobin 11.0, platelets 229,000, WBC 2700 with an ANC of 1400.  Comprehensive metabolic panel included a creatinine of 0.61.  Liver function tests included an alkaline phosphatase of 129 (39-117), AST 54 (0-40) and ALT 76 (0-32).  Phosphorus was 3.9.  Creatinine kinase was 120 (24-173).  [facility] labs on 04/23/2016 included hematocrit of 30.4, hemoglobin 10.0,  platelets 266,000, WBC 3200 with an ANC of 1600.  Comprehensive metabolic panel included a creatinine of 0.68.  Liver function tests included an alkaline phosphatase of 195 (39-117), AST 45 (0-40) and ALT 69 (0-32).  Phosphorus was 3.2.  Creatinine kinase was 258 (24-173).  [facility] labs on 04/30/2016 included hematocrit of 32.0, hemoglobin 10.8, platelets 287,000, WBC 2100 with an ANC of 900.  Comprehensive metabolic panel included a creatinine of 0.68.  Liver function tests included an alkaline phosphatase of 166 (39-117), AST 42 (0-40) and ALT 44 (0-32).  Phosphorus was 4.0.  Creatinine kinase was 182 (24-173).  [facility] labs on 05/21/2016 included hematocrit of 28.9, hemoglobin 9.4, platelets 167,000, WBC 2500 with an ANC of 1,100.  Comprehensive metabolic panel included a creatinine of 0.68.  Liver function tests included an alkaline phosphatase of 244 (39-117), AST 63 (0-40) and ALT 86 (0-32).  Phosphorus was 4.0.  Creatinine kinase was 433 (24-173).   No visits with results within 3 Day(s) from this visit. Latest known visit with results  is:  Infusion on 04/03/2016  Component Date Value Ref Range Status  . Total Protein 04/03/2016 7.4  6.5 - 8.1 g/dL Final  . Albumin 18/53/7146 3.9  3.5 - 5.0 g/dL Final  . AST 89/72/8187 48* 15 - 41 U/L Final  . ALT 04/03/2016 69* 14 - 54 U/L Final  . Alkaline Phosphatase 04/03/2016 126  38 - 126 U/L Final  . Total Bilirubin 04/03/2016 0.6  0.3 - 1.2 mg/dL Final  . Bilirubin, Direct 04/03/2016 0.1  0.1 - 0.5 mg/dL Final  . Indirect Bilirubin 04/03/2016 0.5  0.3 - 0.9 mg/dL Final   Assessment:  SABREEN KITCHEN is a 55 y.o. female 55 y.o. female with stage IV uterine leiomyosarcoma. She underwent TAH/BSO on 01/30/2015. Pathology confirmed high grade leiomyosarcoma with 2 large adjacent tumor nodules (10 cm and 5 cm) and a separate 0.5 cm serosal nodule.   Chest, abdomen, and pelvic CT scan on 02/12/2015 revealed a 1.7 x 2.3 cm right external iliac node. Pulmonary nodules were scattered throughout the lungs bilaterally. There was left internal mammary chain adenopathy measuring up to 3.6 x 2 cm.  She underwent anterior chest wall pleural-based CT guided biopsy on 02/21/2015. Pathology was consistent with metastatic leiomyosarcoma. Bone scan on 02/28/2015 revealed no evidence of metastatic disease with indeterminate uptake in the left posterior rib.   She has a fraternal twin who developed breast cancer at age 24. Several other family members have malignancies. 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  Prechemotherapy labs from [facility] revealed leukopenia (WBC 2300 with ANC 900). Work-up to date is negative. She has a family history of leukopenia. Bone marrow on 03/08/2015 revealed no evidence of neoplasia or metastatic disease. Marrow was normocellular for age (30-50%) with trilineage hematopoiesis. There was no increase in marrow reticulin fibers. Flow cytometry and cytogenetics were normal.  She  received 4 cycles of gemcitabine and Taxotere (03/20/2015 - 05/22/2015) with Neulasta support. She received gemcitabine alone on 05/22/2015.  She was admitted on 05/24/2015 with an acute febrile illness. All cultures were negative.   Chest CT on 05/25/2015 revealed early progressive disease. Abdominal and pelvic CT scan on 05/30/2015 revealed stable to slightly increased right external iliac lymph node (1.8 x 2.5 cm).   Echocardiogram on 06/18/2015 revealed EF of 60-65%.  Echo on 10/22/2015 revealed an EF of 65%.  MUGA on 12/25/2015 revealed an EF of 52%.  Echo on 03/03/2016 revealed  an EF of 50%.  Echo on 05/21/2016 revealed an EF of 55%.  Bilateral lower extremity duplex on 05/30/2015 was negative.  Left upper extremity duplex on 06/08/2015 was negative.  Left lower extremity duplex on 07/19/2015 documented a DVT.   Left lower extremity duplex on 01/31/2016 revealed re-cannulization of the prior DVT involving the left popliteal vein.  She is on Xarelto.   She received 8 cycles of adriamycin (06/07/2015 - 12/06/2015) with Neulasta (On-Pro) support.  Cycle #3 was complicated by an acute  left lower extremity DVT.  Cycle #6 was postponed a week secondary to neutropenia (Diablock 700).  Zinecard was added with cycle #7.  Olaratumab [name] was added with cycle #8.  Chest, abdomen, and pelvic CT scan on 08/07/2015 revealed a partial treatment response with decrease anterior left upper pleural metastasis, stable to decreased pulmonary metastases and decreased right external iliac adenopathy. There were no new sites of disease.  Chest, abdomen, and pelvic CT scan on 10/22/2015 revealed mild decrease in left anterior chest wall mass/internal mammary lymphadenopathy (3.1 x 4.6 cm to 2.5 x 4.4 cm).  There was decreased mild right external iliac lymphadenopathy (1.4 cm to 1.2 cm).  There was diffuse stable bilateral pulmonary metastases (largest 1.1 cm in anterior right lung).  There was no new or progressive  metastatic disease.  Chest CT on 12/25/2015 on revealed slight progression in pulmonary nodules (index nodules: 5 mm to 11 mm and 8 mm to 11 mm).  The dominant soft tissue lesion along the left hemi-thorax was 2.2 x 4.3 cm (stable).  She received 3 cycles of single agent olaratumab [name] (12/27/2015 -02/08/2016).  She required GCSF secondary to neutropenia.  PET scan on 02/26/2016 revealed progression of numerous pulmonary nodules/metastasis and mild progression (1.2 cm with SUV 7) of right pelvic nodal metastasis.  In the right lung apex, there was a 1.3 x 1.0 cm (SUV 6.2) previously 0.9 x 0.6 cm.  In the anterior left lung was a 2.0 x 2.2 cm (SUV 12.4) previously 1.1 x 0.9 cm.  In the anterior right middle lobe was a 1.1 x 1.1 cm (SUV 2.9), previously 0.9 x 0.9 cm.  The subpleural mass along the anterior of the left upper lobe was 2.1 x 3.5 cm (not FDG avid), previoulsy 2.2 x 3.4 cm.  Bone scan on 03/05/2016 revealed no evidence of metastatic disease.  She is s/p 3 cycles of trabectedin (Yondelis) (03/13/2016 - 05/01/2016) with Neulasta support.  She had a grade I liver toxicity secondary to Yondelis. Chest CT on 04/28/2016 revealed innumerable pulmonary metastases which had slightly increased (67m) in size since 02/26/2016.  Symptomatically, she has had some interval left lower extremity swelling (improved over past few days).  ANC is 1,100.  CK is 433 (2.5 x ULN).  Plan: 1.  Review [facility] labs.  Discuss elevated CK.  Discuss postponing chemotherapy x 1 week to allow improvement in CK.  Recheck next week. 2.  Review interval echocardiogram. 3.  Discuss repeating left lower extremity duplex to r/o recurrent clot.  Patient on Xarelto.  If significant swelling persists, may need abdominal/pelvic CT scan to r/o clot not seen by ultrasound. 4.  Postpone chemotherapy this week. 5.  Left lower extremity duplex today. 6.  Continue Xarelto. 7.  RTC in 1 week for NP assessment, review of [facility]  labs (CBC with diff, CMP, CK), and cycle #4 Yondelis.  Addendum:  Left lower extremity duplex today revealed no evidence of DVT.   MLequita Asal MD  05/22/2016, 9:05 AM

## 2016-05-24 ENCOUNTER — Encounter: Payer: Self-pay | Admitting: Hematology and Oncology

## 2016-05-25 ENCOUNTER — Other Ambulatory Visit: Payer: Self-pay | Admitting: Hematology and Oncology

## 2016-05-28 ENCOUNTER — Encounter: Payer: Self-pay | Admitting: Hematology and Oncology

## 2016-05-29 ENCOUNTER — Inpatient Hospital Stay: Payer: 59 | Attending: Family Medicine | Admitting: Family Medicine

## 2016-05-29 ENCOUNTER — Encounter: Payer: Self-pay | Admitting: Family Medicine

## 2016-05-29 ENCOUNTER — Inpatient Hospital Stay: Payer: 59

## 2016-05-29 VITALS — BP 130/87 | HR 72 | Temp 97.5°F | Resp 18 | Ht 64.0 in | Wt 165.3 lb

## 2016-05-29 DIAGNOSIS — D709 Neutropenia, unspecified: Secondary | ICD-10-CM | POA: Diagnosis not present

## 2016-05-29 DIAGNOSIS — C7800 Secondary malignant neoplasm of unspecified lung: Secondary | ICD-10-CM

## 2016-05-29 DIAGNOSIS — T451X5S Adverse effect of antineoplastic and immunosuppressive drugs, sequela: Secondary | ICD-10-CM | POA: Insufficient documentation

## 2016-05-29 DIAGNOSIS — C55 Malignant neoplasm of uterus, part unspecified: Secondary | ICD-10-CM | POA: Diagnosis present

## 2016-05-29 DIAGNOSIS — R600 Localized edema: Secondary | ICD-10-CM | POA: Insufficient documentation

## 2016-05-29 DIAGNOSIS — Z79899 Other long term (current) drug therapy: Secondary | ICD-10-CM | POA: Insufficient documentation

## 2016-05-29 LAB — CBC WITH DIFFERENTIAL/PLATELET
BASOS ABS: 0 10*3/uL (ref 0–0.1)
Basophils Relative: 1 %
EOS PCT: 1 %
Eosinophils Absolute: 0 10*3/uL (ref 0–0.7)
HEMATOCRIT: 29.1 % — AB (ref 35.0–47.0)
Hemoglobin: 10 g/dL — ABNORMAL LOW (ref 12.0–16.0)
LYMPHS ABS: 0.7 10*3/uL — AB (ref 1.0–3.6)
LYMPHS PCT: 29 %
MCH: 33.6 pg (ref 26.0–34.0)
MCHC: 34.4 g/dL (ref 32.0–36.0)
MCV: 97.9 fL (ref 80.0–100.0)
MONO ABS: 0.5 10*3/uL (ref 0.2–0.9)
MONOS PCT: 18 %
NEUTROS ABS: 1.3 10*3/uL — AB (ref 1.4–6.5)
Neutrophils Relative %: 51 %
PLATELETS: 269 10*3/uL (ref 150–440)
RBC: 2.97 MIL/uL — ABNORMAL LOW (ref 3.80–5.20)
RDW: 20.5 % — AB (ref 11.5–14.5)
WBC: 2.6 10*3/uL — ABNORMAL LOW (ref 3.6–11.0)

## 2016-05-29 NOTE — Progress Notes (Signed)
[facility] day:  05/29/2016  Chief Complaint: Cassidy Bradley is an 55 y.o. female with metastatic uterine leiomyosarcoma who is seen for prior to cycle #4 trabectedin (Yondelis).  HPI: The patient was last seen in the medical oncology clinic on 05/01/2016.  At that time, she received cycle #3 trabectedin.  Follow-up chest CT revealed stable disease (increase by 1 mm).  Echocardiogram on 05/21/2016 revealed an EF of 55%.   Patient reports continuing to feel well and has noticed improvement in swelling of lower extremities with the use of compression stockings. Her treatments on 05/21/2016 for delayed due to an elevated CK as well as an [place] of 1100. She overall reports feeling very well and denies any acute complaints today.   Past Medical History  Diagnosis Date  . Breast lump 2006     both breast   . Chest wall mass   . Cancer ([place])   . Uterine cancer [facility])     Past Surgical History  Procedure Laterality Date  . Cholecystectomy  2001  . Ankle surgery  2005  . Cesarean section  1993  . Back surgery  2005  . Breast biopsy Bilateral   . Colonoscopy    . Abdominal hysterectomy  01/30/15   Family History  Problem Relation Age of Onset  . Breast cancer Sister 17  . Breast cancer Maternal Aunt   . Breast cancer Maternal Grandmother 60  . Stomach cancer Maternal Aunt   . Lung cancer Maternal Grandfather   . Colon polyps Mother   . Cancer - Colon Cousin 68    first cousin  . Lupus Father   . Esophageal cancer Maternal Uncle     Social History:  reports that she has never smoked. She has never used smokeless tobacco. She reports that she does not drink alcohol or use illicit drugs.   The patient is alone today.  Allergies:  Allergies  Allergen Reactions  . Pravastatin Hives and Nausea And Vomiting  . Sulfa Antibiotics Hives and Swelling  . Latex Rash  . Other Rash and Other (See Comments)    Pt states that she is allergic to  spandex.   Current Medications: Current Outpatient Prescriptions  Medication Sig Dispense Refill  . lidocaine-prilocaine (EMLA) cream Apply 1 application topically as needed. Apply one hour prior to chemotherapy (Patient taking differently: Apply 1 application topically as needed (prior to accessing pts port). Pt applies one hour prior to chemotherapy.) 30 g 1  . loratadine (CLARITIN) 10 MG tablet Take 10 mg by mouth daily.    . Multiple Vitamins-Minerals (ALIVE WOMENS ENERGY) TABS Take by mouth.    . ondansetron (ZOFRAN) 8 MG tablet Take 8 mg by mouth every 8 (eight) hours as needed for nausea or vomiting.     . polyethylene glycol powder (GLYCOLAX/MIRALAX) powder Take by mouth. Reported on 05/01/2016    . rivaroxaban (XARELTO) 20 MG TABS tablet Take 1 tablet (20 mg total) by mouth daily with supper. 90 tablet 0   No current facility-administered medications for this visit.   Facility-Administered Medications Ordered in Other Visits  Medication Dose Route Frequency Provider Last Rate Last Dose  . 0.9 %  sodium chloride infusion   Intravenous Continuous Lequita Asal, MD   Stopped at 05/01/16 1340  . heparin lock flush 100 unit/mL  500 Units Intracatheter Once PRN Lequita Asal, MD   500 Units at 04/03/16 1615  . heparin lock flush 100 unit/mL  500 Units Intracatheter Once PRN Rosey Bath, MD   500 Units at 05/01/16 1306  . sodium chloride 0.9 % injection 10 mL  10 mL Intracatheter PRN Rosey Bath, MD      . sodium chloride flush (NS) 0.9 % injection 10 mL  10 mL Intracatheter PRN Rosey Bath, MD   10 mL at 04/03/16 1241  . sodium chloride flush (NS) 0.9 % injection 10 mL  10 mL Intracatheter PRN Rosey Bath, MD   10 mL at 05/01/16 1225    Review of Systems:  GENERAL:  Feels good.  No fevers or sweats.  PERFORMANCE STATUS (ECOG):  2 HEENT:  No visual changes,sore throat, mouth sores or tenderness. Lungs: Shortness of breath with exertion (stable).  No  cough.  No hemoptysis. Cardiac:  No chest pain, palpitations, orthopnea, or PND. GI:  Reflux.  No nausea, vomiting, constipation, melena, or hematochezia. GU:  No urgency, frequency, dysuria, or hematuria. Musculoskeletal:  No bone or back pain.  No joint pain.  No muscle tenderness. Extremities: Left lower extremity swelling  Greatly improved over past several days. Skin:  No rashes or skin changes. Neuro:  No headache, numbness or weakness, balance or coordination issues. Endocrine:  No diabetes, thyroid issues, hot flashes or night sweats. Psych:  No mood changes, depression or anxiety. Pain:  No focal pain. Review of systems:  All other systems reviewed and found to be negative.                        Physical Exam: Blood pressure 130/87, pulse 72, temperature 97.5 F (36.4 C), temperature source Tympanic, resp. rate 18, height 5\' 4"  (1.626 m), weight 165 lb 5.5 oz (75 kg).  GENERAL:  Well developed, well nourished, sitting comfortably in the exam room in no acute distress. MENTAL STATUS:  Alert and oriented to person, place and time. HEAD:  Short graying hair.  Normocephalic, atraumatic, face symmetric, no Cushingoid features. EYES:  Glasses.  Brown eyes.  Pupils equal round and reactive to light and accomodation.  No conjunctivitis or scleral icterus. ENT:  Oropharynx clear without lesion.  Tongue normal. Mucous membranes moist.  RESPIRATORY:  Clear to auscultation without rales, wheezes or rhonchi. CARDIOVASCULAR:  Regular rate and rhythm without murmur, rub or gallop. No JVD. ABDOMEN:  Soft, non-tender, with active bowel sounds, and no hepatosplenomegaly.  No masses. SKIN:  No rashes, ulcers or lesions. EXTREMITIES:   trace left lower extremity edema below the knee LYMPH NODES: No palpable cervical, supraclavicular, axillary or inguinal adenopathy  NEUROLOGICAL: Appropriate. PSYCH:  Appropriate.  Laboratory testing: [facility] labs on 02/28/2016 included a hematocrit 36.1,  hemoglobin 12.1, platelets 206,000, white count 13,200 with an ANC of 11,400.  Comprehensive metabolic panel included a creatinine of 0.67. Liver function tests included an alkaline phosphatase of 122 (39-117).    [facility] labs on 03/12/2016 included a comprehensive metabolic panel included a creatinine of 0.57.  Liver function tests included an alkaline phosphatase of 118 (39-117) post Neupogen.  Phosphorus was 3.4.  Creatinine kinase was 156 (24-173).  [facility] labs on 04/02/2016 included hematocrit of 33.3, hemoglobin 11.0, platelets 229,000, WBC 2700 with an ANC of 1400.  Comprehensive metabolic panel included a creatinine of 0.61.  Liver function tests included an alkaline phosphatase of 129 (39-117), AST 54 (0-40) and ALT 76 (0-32).  Phosphorus was 3.9.  Creatinine kinase was 120 (24-173).  [facility] labs on 04/23/2016 included hematocrit of 30.4, hemoglobin 10.0, platelets  266,000, WBC 3200 with an ANC of 1600.  Comprehensive metabolic panel included a creatinine of 0.68.  Liver function tests included an alkaline phosphatase of 195 (39-117), AST 45 (0-40) and ALT 69 (0-32).  Phosphorus was 3.2.  Creatinine kinase was 258 (24-173).  [facility] labs on 04/30/2016 included hematocrit of 32.0, hemoglobin 10.8, platelets 287,000, WBC 2100 with an ANC of 900.  Comprehensive metabolic panel included a creatinine of 0.68.  Liver function tests included an alkaline phosphatase of 166 (39-117), AST 42 (0-40) and ALT 44 (0-32).  Phosphorus was 4.0.  Creatinine kinase was 182 (24-173).  [facility] labs on 05/21/2016 included hematocrit of 28.9, hemoglobin 9.4, platelets 167,000, WBC 2500 with an ANC of 1,100.  Comprehensive metabolic panel included a creatinine of 0.68.  Liver function tests included an alkaline phosphatase of 244 (39-117), AST 63 (0-40) and ALT 86 (0-32).  Phosphorus was 4.0.  Creatinine kinase was 433 (24-173).  [facility] labs on 05/29/2016 included hematocrit of 29.6, hemoglobin 10.1, platelets  257,000, WBC 2400 ( that is no differential performed at [facility]),  Comprehensive metabolic panel included a  Creatinine 0.70. Liver function tests included an alkaline phosphatase of 231, AST 59 and ALT 59. Phosphorus was 3.7. Creatinine kinase was 257.   Appointment on 05/29/2016  Component Date Value Ref Range Status  . WBC 05/29/2016 2.6* 3.6 - 11.0 K/uL Final  . RBC 05/29/2016 2.97* 3.80 - 5.20 MIL/uL Final  . Hemoglobin 05/29/2016 10.0* 12.0 - 16.0 g/dL Final  . HCT 05/29/2016 29.1* 35.0 - 47.0 % Final  . MCV 05/29/2016 97.9  80.0 - 100.0 fL Final  . MCH 05/29/2016 33.6  26.0 - 34.0 pg Final  . MCHC 05/29/2016 34.4  32.0 - 36.0 g/dL Final  . RDW 05/29/2016 20.5* 11.5 - 14.5 % Final  . Platelets 05/29/2016 269  150 - 440 K/uL Final  . Neutrophils Relative % 05/29/2016 51   Final  . Neutro Abs 05/29/2016 1.3* 1.4 - 6.5 K/uL Final  . Lymphocytes Relative 05/29/2016 29   Final  . Lymphs Abs 05/29/2016 0.7* 1.0 - 3.6 K/uL Final  . Monocytes Relative 05/29/2016 18   Final  . Monocytes Absolute 05/29/2016 0.5  0.2 - 0.9 K/uL Final  . Eosinophils Relative 05/29/2016 1   Final  . Eosinophils Absolute 05/29/2016 0.0  0 - 0.7 K/uL Final  . Basophils Relative 05/29/2016 1   Final  . Basophils Absolute 05/29/2016 0.0  0 - 0.1 K/uL Final   Assessment:  Cassidy Bradley is a 55 y.o. female 55 y.o. female with stage IV uterine leiomyosarcoma. She underwent TAH/BSO on 01/30/2015. Pathology confirmed high grade leiomyosarcoma with 2 large adjacent tumor nodules (10 cm and 5 cm) and a separate 0.5 cm serosal nodule.   Chest, abdomen, and pelvic CT scan on 02/12/2015 revealed a 1.7 x 2.3 cm right external iliac node. Pulmonary nodules were scattered throughout the lungs bilaterally. There was left internal mammary chain adenopathy measuring up to 3.6 x 2 cm.  She underwent anterior chest wall pleural-based CT guided biopsy on 02/21/2015. Pathology was consistent with metastatic leiomyosarcoma.  Bone scan on 02/28/2015 revealed no evidence of metastatic disease with indeterminate uptake in the left posterior rib.   She has a fraternal twin who developed breast cancer at age 82. Several other family members have malignancies. 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  Prechemotherapy labs from [facility] revealed leukopenia (WBC 2300 with ANC 900). Work-up to date  is negative. She has a family history of leukopenia. Bone marrow on 03/08/2015 revealed no evidence of neoplasia or metastatic disease. Marrow was normocellular for age (30-50%) with trilineage hematopoiesis. There was no increase in marrow reticulin fibers. Flow cytometry and cytogenetics were normal.  She received 4 cycles of gemcitabine and Taxotere (03/20/2015 - 05/22/2015) with Neulasta support. She received gemcitabine alone on 05/22/2015.  She was admitted on 05/24/2015 with an acute febrile illness. All cultures were negative.   Chest CT on 05/25/2015 revealed early progressive disease. Abdominal and pelvic CT scan on 05/30/2015 revealed stable to slightly increased right external iliac lymph node (1.8 x 2.5 cm).   Echocardiogram on 06/18/2015 revealed EF of 60-65%.  Echo on 10/22/2015 revealed an EF of 65%.  MUGA on 12/25/2015 revealed an EF of 52%.  Echo on 03/03/2016 revealed an EF of 50%.  Echo on 05/21/2016 revealed an EF of 55%.  Bilateral lower extremity duplex on 05/30/2015 was negative.  Left upper extremity duplex on 06/08/2015 was negative.  Left lower extremity duplex on 07/19/2015 documented a DVT.   Left lower extremity duplex on 01/31/2016 revealed re-cannulization of the prior DVT involving the left popliteal vein.  She is on Xarelto.   She received 8 cycles of adriamycin (06/07/2015 - 12/06/2015) with Neulasta (On-Pro) support.  Cycle #3 was complicated by an acute  left lower extremity DVT.  Cycle #6 was postponed a week  secondary to neutropenia ([place] 700).  Zinecard was added with cycle #7.  Olaratumab [name] was added with cycle #8.  Chest, abdomen, and pelvic CT scan on 08/07/2015 revealed a partial treatment response with decrease anterior left upper pleural metastasis, stable to decreased pulmonary metastases and decreased right external iliac adenopathy. There were no new sites of disease.  Chest, abdomen, and pelvic CT scan on 10/22/2015 revealed mild decrease in left anterior chest wall mass/internal mammary lymphadenopathy (3.1 x 4.6 cm to 2.5 x 4.4 cm).  There was decreased mild right external iliac lymphadenopathy (1.4 cm to 1.2 cm).  There was diffuse stable bilateral pulmonary metastases (largest 1.1 cm in anterior right lung).  There was no new or progressive metastatic disease.  Chest CT on 12/25/2015 on revealed slight progression in pulmonary nodules (index nodules: 5 mm to 11 mm and 8 mm to 11 mm).  The dominant soft tissue lesion along the left hemi-thorax was 2.2 x 4.3 cm (stable).  She received 3 cycles of single agent olaratumab [name] (12/27/2015 -02/08/2016).  She required GCSF secondary to neutropenia.  PET scan on 02/26/2016 revealed progression of numerous pulmonary nodules/metastasis and mild progression (1.2 cm with SUV 7) of right pelvic nodal metastasis.  In the right lung apex, there was a 1.3 x 1.0 cm (SUV 6.2) previously 0.9 x 0.6 cm.  In the anterior left lung was a 2.0 x 2.2 cm (SUV 12.4) previously 1.1 x 0.9 cm.  In the anterior right middle lobe was a 1.1 x 1.1 cm (SUV 2.9), previously 0.9 x 0.9 cm.  The subpleural mass along the anterior of the left upper lobe was 2.1 x 3.5 cm (not FDG avid), previoulsy 2.2 x 3.4 cm.  Bone scan on 03/05/2016 revealed no evidence of metastatic disease.  She is s/p 3 cycles of trabectedin (Yondelis) (03/13/2016 - 05/01/2016) with Neulasta support.  She had a grade I liver toxicity secondary to Yondelis. Chest CT on 04/28/2016 revealed  innumerable pulmonary metastases which had slightly increased (51m) in size since 02/26/2016.  Symptomatically,  Patient is doing very  well and left lower extremity edema is improved with compression stockings Differential was not performed on today's labs that were drawn at [facility]. [place] is 1,300.  CK is 257.    Plan: 1.  Review [facility] labs.  ANC remains less than 1500.  Discuss postponing chemotherapy x 1 week.  CK has improved to 257. 2.  Postpone chemotherapy this week due to continued neutropenia. 3.  Left lower extremity edema has improved with use of compression stockings as well as elevation. 4.  Continue Xarelto. 5.  RTC in 1 week for review of [facility] labs (CBC with diff, CMP, CK), and cycle #4 Yondelis.    Evlyn Kanner, NP 05/29/2016, 10:44 AM

## 2016-05-29 NOTE — Progress Notes (Signed)
Pt here for chemo today. Pt put on left leg stocking and swelling of lef tleg improved. Still tender to touch but no pain. eating and drinking good. Bowels good, no pain.

## 2016-06-04 ENCOUNTER — Other Ambulatory Visit: Payer: Self-pay | Admitting: Hematology and Oncology

## 2016-06-04 ENCOUNTER — Encounter: Payer: Self-pay | Admitting: Hematology and Oncology

## 2016-06-05 ENCOUNTER — Inpatient Hospital Stay: Payer: 59

## 2016-06-05 ENCOUNTER — Encounter: Payer: Self-pay | Admitting: Hematology and Oncology

## 2016-06-05 ENCOUNTER — Inpatient Hospital Stay (HOSPITAL_BASED_OUTPATIENT_CLINIC_OR_DEPARTMENT_OTHER): Payer: 59 | Admitting: Hematology and Oncology

## 2016-06-05 VITALS — BP 139/87 | HR 104 | Temp 96.2°F | Ht 64.0 in | Wt 166.4 lb

## 2016-06-05 DIAGNOSIS — R600 Localized edema: Secondary | ICD-10-CM

## 2016-06-05 DIAGNOSIS — C7800 Secondary malignant neoplasm of unspecified lung: Secondary | ICD-10-CM | POA: Diagnosis not present

## 2016-06-05 DIAGNOSIS — I82402 Acute embolism and thrombosis of unspecified deep veins of left lower extremity: Secondary | ICD-10-CM

## 2016-06-05 DIAGNOSIS — C55 Malignant neoplasm of uterus, part unspecified: Secondary | ICD-10-CM

## 2016-06-05 DIAGNOSIS — Z79899 Other long term (current) drug therapy: Secondary | ICD-10-CM

## 2016-06-05 DIAGNOSIS — R748 Abnormal levels of other serum enzymes: Secondary | ICD-10-CM

## 2016-06-05 MED ORDER — TRABECTEDIN CHEMO INJECTION 1 MG IV
1.5000 mg/m2 | Freq: Once | INTRAVENOUS | Status: AC
  Administered 2016-06-05: 2.8 mg via INTRAVENOUS
  Filled 2016-06-05: qty 56

## 2016-06-05 MED ORDER — HEPARIN SOD (PORK) LOCK FLUSH 100 UNIT/ML IV SOLN: 500.0000 [IU] | Freq: Once | INTRAVENOUS | Status: AC | PRN

## 2016-06-05 MED ORDER — SODIUM CHLORIDE 0.9 % IV SOLN
INTRAVENOUS | Status: AC
  Administered 2016-06-05: 12:00:00 via INTRAVENOUS
  Filled 2016-06-05: qty 1000

## 2016-06-05 MED ORDER — SODIUM CHLORIDE 0.9% FLUSH
10.0000 mL | INTRAVENOUS | Status: AC | PRN
  Administered 2016-06-05: 10 mL
  Filled 2016-06-05: qty 10

## 2016-06-05 MED ORDER — PALONOSETRON HCL INJECTION 0.25 MG/5ML
0.2500 mg | Freq: Once | INTRAVENOUS | Status: AC
  Administered 2016-06-05: 0.25 mg via INTRAVENOUS
  Filled 2016-06-05: qty 5

## 2016-06-05 MED ORDER — SODIUM CHLORIDE 0.9 % IV SOLN
20.0000 mg | Freq: Once | INTRAVENOUS | Status: AC
  Administered 2016-06-05: 20 mg via INTRAVENOUS
  Filled 2016-06-05: qty 2

## 2016-06-05 NOTE — Progress Notes (Unsigned)
ANC 1.3 today. Per Dr Mike Gip may proceed with treatment

## 2016-06-05 NOTE — Progress Notes (Signed)
[facility]-  Cancer Center  Clinic day:  06/05/2016  Chief Complaint: Cassidy Bradley is an 55 y.o. female with metastatic uterine leiomyosarcoma who is seen for prior to cycle #4 trabectedin (Yondelis).  HPI: The patient was last seen in the medical oncology clinic on 05/22/2016.  At that time, she has had some interval left lower extremity swelling (improved over past few days). ANC was 1,100. CK was 433 (2.5 ULN).  Decision was made to postpone chemotherapy 1 week to allow improvement in her CK.  Left lower extremity duplex revealed no evidence of DVT.  She saw Rhett Bannister, NP on  05/29/2016.  Labs revealed a WBC of 2600 with an ANC of 1300.  CK was 257.  Her chemotherapy was held secondary to her low counts.  Symptomatically, she feels good.  She denies any concerns.   Past Medical History  Diagnosis Date  . Breast lump 2006     both breast   . Chest wall mass   . Cancer (HCC)   . Uterine cancer [facility])     Past Surgical History  Procedure Laterality Date  . Cholecystectomy  2001  . Ankle surgery  2005  . Cesarean section  1993  . Back surgery  2005  . Breast biopsy Bilateral   . Colonoscopy    . Abdominal hysterectomy  01/30/15   Family History  Problem Relation Age of Onset  . Breast cancer Sister 10  . Breast cancer Maternal Aunt   . Breast cancer Maternal Grandmother 60  . Stomach cancer Maternal Aunt   . Lung cancer Maternal Grandfather   . Colon polyps Mother   . Cancer - Colon Cousin 31    first cousin  . Lupus Father   . Esophageal cancer Maternal Uncle     Social History:  reports that she has never smoked. She has never used smokeless tobacco. She reports that she does not drink alcohol or use illicit drugs.   The patient is alone today.  Allergies:  Allergies  Allergen Reactions  . Pravastatin Hives and Nausea And Vomiting  . Sulfa Antibiotics Hives and Swelling  . Latex Rash  . Other Rash and Other (See Comments)    Pt  states that she is allergic to spandex.   Current Medications: Current Outpatient Prescriptions  Medication Sig Dispense Refill  . lidocaine-prilocaine (EMLA) cream Apply 1 application topically as needed. Apply one hour prior to chemotherapy (Patient taking differently: Apply 1 application topically as needed (prior to accessing pts port). Pt applies one hour prior to chemotherapy.) 30 g 1  . loratadine (CLARITIN) 10 MG tablet Take 10 mg by mouth daily.    . Multiple Vitamins-Minerals (ALIVE WOMENS ENERGY) TABS Take by mouth.    . ondansetron (ZOFRAN) 8 MG tablet Take 8 mg by mouth every 8 (eight) hours as needed for nausea or vomiting.     . polyethylene glycol powder (GLYCOLAX/MIRALAX) powder Take by mouth. Reported on 05/01/2016    . rivaroxaban (XARELTO) 20 MG TABS tablet Take 1 tablet (20 mg total) by mouth daily with supper. 90 tablet 0   No current facility-administered medications for this visit.   Facility-Administered Medications Ordered in Other Visits  Medication Dose Route Frequency Provider Last Rate Last Dose  . 0.9 %  sodium chloride infusion   Intravenous Continuous Rosey Bath, MD   Stopped at 05/01/16 1340  . heparin lock flush 100 unit/mL  500 Units Intracatheter Once PRN Rosey Bath, MD  500 Units at 04/03/16 1615  . heparin lock flush 100 unit/mL  500 Units Intracatheter Once PRN Lequita Asal, MD   500 Units at 05/01/16 1306  . sodium chloride 0.9 % injection 10 mL  10 mL Intracatheter PRN Lequita Asal, MD      . sodium chloride flush (NS) 0.9 % injection 10 mL  10 mL Intracatheter PRN Lequita Asal, MD   10 mL at 04/03/16 1241  . sodium chloride flush (NS) 0.9 % injection 10 mL  10 mL Intracatheter PRN Lequita Asal, MD   10 mL at 05/01/16 1225    Review of Systems:  GENERAL:  Feels good.  No fevers or sweats. Weight up 12 pounds. PERFORMANCE STATUS (ECOG):  2 HEENT:  No visual changes,sore throat, mouth sores or  tenderness. Lungs: Shortness of breath with exertion (stable).  No cough.  No hemoptysis. Cardiac:  No chest pain, palpitations, orthopnea, or PND. GI:  Reflux.  No nausea, vomiting, constipation, melena, or hematochezia. GU:  No urgency, frequency, dysuria, or hematuria. Musculoskeletal:  No bone or back pain.  No joint pain.  No muscle tenderness. Extremities: Left lower extremity swelling, stable.  Wears support stockings. Skin:  No rashes or skin changes. Neuro:  No headache, numbness or weakness, balance or coordination issues. Endocrine:  No diabetes, thyroid issues, hot flashes or night sweats. Psych:  No mood changes, depression or anxiety. Pain:  No focal pain. Review of systems:  All other systems reviewed and found to be negative.                        Physical Exam: Blood pressure 139/87, pulse 104, temperature 96.2 F (35.7 C), temperature source Tympanic, height '5\' 4"'$  (1.626 m), weight 166 lb 7.2 oz (75.5 kg).  GENERAL:  Well developed, well nourished, woman sitting comfortably in the exam room in no acute distress. MENTAL STATUS:  Alert and oriented to person, place and time. HEAD:  Short graying hair.  Normocephalic, atraumatic, face symmetric, no Cushingoid features. EYES:  Glasses.  Brown eyes.  Pupils equal round and reactive to light and accomodation.  No conjunctivitis or scleral icterus. ENT:  Oropharynx clear without lesion.  Tongue normal. Mucous membranes moist.  RESPIRATORY:  Clear to auscultation without rales, wheezes or rhonchi. CARDIOVASCULAR:  Regular rate and rhythm without murmur, rub or gallop. No JVD. ABDOMEN:  Soft, non-tender, with active bowel sounds, and no hepatosplenomegaly.  No masses. SKIN:  No rashes, ulcers or lesions. EXTREMITIES:  1+ left lower extremity edema below the knee (stable). LYMPH NODES: No palpable cervical, supraclavicular, axillary or inguinal adenopathy  NEUROLOGICAL: Appropriate. PSYCH:  Appropriate.  Laboratory  testing: [facility] labs on 02/28/2016 included a hematocrit 36.1, hemoglobin 12.1, platelets 206,000, white count 13,200 with an [place] of 11,400.  Comprehensive metabolic panel included a creatinine of 0.67. Liver function tests included an alkaline phosphatase of 122 (39-117).    [facility] labs on 03/12/2016 included a comprehensive metabolic panel included a creatinine of 0.57.  Liver function tests included an alkaline phosphatase of 118 (39-117) post Neupogen.  Phosphorus was 3.4.  Creatinine kinase was 156 (24-173).  [facility] labs on 04/02/2016 included hematocrit of 33.3, hemoglobin 11.0, platelets 229,000, WBC 2700 with an ANC of 1400.  Comprehensive metabolic panel included a creatinine of 0.61.  Liver function tests included an alkaline phosphatase of 129 (39-117), AST 54 (0-40) and ALT 76 (0-32).  Phosphorus was 3.9.  Creatinine kinase was 120 (24-173).  [facility] labs on 04/23/2016 included hematocrit of 30.4, hemoglobin 10.0, platelets 266,000, WBC 3200 with an ANC of 1600.  Comprehensive metabolic panel included a creatinine of 0.68.  Liver function tests included an alkaline phosphatase of 195 (39-117), AST 45 (0-40) and ALT 69 (0-32).  Phosphorus was 3.2.  Creatinine kinase was 258 (24-173).  [facility] labs on 04/30/2016 included hematocrit of 32.0, hemoglobin 10.8, platelets 287,000, WBC 2100 with an ANC of 900.  Comprehensive metabolic panel included a creatinine of 0.68.  Liver function tests included an alkaline phosphatase of 166 (39-117), AST 42 (0-40) and ALT 44 (0-32).  Phosphorus was 4.0.  Creatinine kinase was 182 (24-173).  [facility] labs on 05/21/2016 included hematocrit of 28.9, hemoglobin 9.4, platelets 167,000, WBC 2500 with an ANC of 1,100.  Comprehensive metabolic panel included a creatinine of 0.68.  Liver function tests included an alkaline phosphatase of 244 (39-117), AST 63 (0-40) and ALT 86 (0-32).  Phosphorus was 4.0.  Creatinine kinase was 433 (24-173).  [facility] labs on  05/29/2016 included hematocrit of 29.6, hemoglobin 10.1, platelets 257,000, WBC 2400.  Comprehensive metabolic panel included a creatinine 0.70. Liver function tests included an alkaline phosphatase of 231, AST 59 and ALT 59. Phosphorus was 3.7. Creatinine kinase was 257.  [facility] labs on 06/04/2016 included hematocrit of 32.7, hemoglobin 10.6, platelets 287,000, WBC 2800 with an ANC of 1,300.  Comprehensive metabolic panel included a creatinine of 0.68.  Liver function tests included an alkaline phosphatase of 241 (39-117), AST 64 (0-40) and ALT 67 (0-32).  Phosphorus was 4.0.  Creatinine kinase was 229 (24-173).  No visits with results within 3 Day(s) from this visit. Latest known visit with results is:  Appointment on 05/29/2016  Component Date Value Ref Range Status  . WBC 05/29/2016 2.6* 3.6 - 11.0 K/uL Final  . RBC 05/29/2016 2.97* 3.80 - 5.20 MIL/uL Final  . Hemoglobin 05/29/2016 10.0* 12.0 - 16.0 g/dL Final  . HCT 05/29/2016 29.1* 35.0 - 47.0 % Final  . MCV 05/29/2016 97.9  80.0 - 100.0 fL Final  . MCH 05/29/2016 33.6  26.0 - 34.0 pg Final  . MCHC 05/29/2016 34.4  32.0 - 36.0 g/dL Final  . RDW 05/29/2016 20.5* 11.5 - 14.5 % Final  . Platelets 05/29/2016 269  150 - 440 K/uL Final  . Neutrophils Relative % 05/29/2016 51   Final  . Neutro Abs 05/29/2016 1.3* 1.4 - 6.5 K/uL Final  . Lymphocytes Relative 05/29/2016 29   Final  . Lymphs Abs 05/29/2016 0.7* 1.0 - 3.6 K/uL Final  . Monocytes Relative 05/29/2016 18   Final  . Monocytes Absolute 05/29/2016 0.5  0.2 - 0.9 K/uL Final  . Eosinophils Relative 05/29/2016 1   Final  . Eosinophils Absolute 05/29/2016 0.0  0 - 0.7 K/uL Final  . Basophils Relative 05/29/2016 1   Final  . Basophils Absolute 05/29/2016 0.0  0 - 0.1 K/uL Final    Assessment:  ZEPPELIN BECKSTRAND is a 55 y.o. female 55 y.o. female with stage IV uterine leiomyosarcoma. She underwent TAH/BSO on 01/30/2015. Pathology confirmed high grade leiomyosarcoma with 2 large  adjacent tumor nodules (10 cm and 5 cm) and a separate 0.5 cm serosal nodule.   Chest, abdomen, and pelvic CT scan on 02/12/2015 revealed a 1.7 x 2.3 cm right external iliac node. Pulmonary nodules were scattered throughout the lungs bilaterally. There was left internal mammary chain adenopathy measuring up to 3.6 x 2 cm.  She underwent anterior chest wall pleural-based CT guided biopsy  on 02/21/2015. Pathology was consistent with metastatic leiomyosarcoma. Bone scan on 02/28/2015 revealed no evidence of metastatic disease with indeterminate uptake in the left posterior rib.   She has a fraternal twin who developed breast cancer at age 84. Several other family members have malignancies. 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  Prechemotherapy labs from [facility] revealed leukopenia (WBC 2300 with ANC 900). Work-up to date is negative. She has a family history of leukopenia. Bone marrow on 03/08/2015 revealed no evidence of neoplasia or metastatic disease. Marrow was normocellular for age (30-50%) with trilineage hematopoiesis. There was no increase in marrow reticulin fibers. Flow cytometry and cytogenetics were normal.  She received 4 cycles of gemcitabine and Taxotere (03/20/2015 - 05/22/2015) with Neulasta support. She received gemcitabine alone on 05/22/2015.  She was admitted on 05/24/2015 with an acute febrile illness. All cultures were negative.   Chest CT on 05/25/2015 revealed early progressive disease. Abdominal and pelvic CT scan on 05/30/2015 revealed stable to slightly increased right external iliac lymph node (1.8 x 2.5 cm).   Echocardiogram on 06/18/2015 revealed EF of 60-65%.  Echo on 10/22/2015 revealed an EF of 65%.  MUGA on 12/25/2015 revealed an EF of 52%.  Echo on 03/03/2016 revealed an EF of 50%.  Echo on 05/21/2016 revealed an EF of 55%.  Bilateral lower extremity duplex on 05/30/2015  was negative.  Left upper extremity duplex on 06/08/2015 was negative.  Left lower extremity duplex on 07/19/2015 documented a DVT.   Left lower extremity duplex on 01/31/2016 revealed re-cannulization of the prior DVT involving the left popliteal vein.  She is on Xarelto.   She received 8 cycles of adriamycin (06/07/2015 - 12/06/2015) with Neulasta (On-Pro) support.  Cycle #3 was complicated by an acute  left lower extremity DVT.  Cycle #6 was postponed a week secondary to neutropenia ([place] 700).  Zinecard was added with cycle #7.  Olaratumab [name] was added with cycle #8.  Chest, abdomen, and pelvic CT scan on 08/07/2015 revealed a partial treatment response with decrease anterior left upper pleural metastasis, stable to decreased pulmonary metastases and decreased right external iliac adenopathy. There were no new sites of disease.  Chest, abdomen, and pelvic CT scan on 10/22/2015 revealed mild decrease in left anterior chest wall mass/internal mammary lymphadenopathy (3.1 x 4.6 cm to 2.5 x 4.4 cm).  There was decreased mild right external iliac lymphadenopathy (1.4 cm to 1.2 cm).  There was diffuse stable bilateral pulmonary metastases (largest 1.1 cm in anterior right lung).  There was no new or progressive metastatic disease.  Chest CT on 12/25/2015 on revealed slight progression in pulmonary nodules (index nodules: 5 mm to 11 mm and 8 mm to 11 mm).  The dominant soft tissue lesion along the left hemi-thorax was 2.2 x 4.3 cm (stable).  She received 3 cycles of single agent olaratumab [name] (12/27/2015 -02/08/2016).  She required GCSF secondary to neutropenia.  PET scan on 02/26/2016 revealed progression of numerous pulmonary nodules/metastasis and mild progression (1.2 cm with SUV 7) of right pelvic nodal metastasis.  In the right lung apex, there was a 1.3 x 1.0 cm (SUV 6.2) previously 0.9 x 0.6 cm.  In the anterior left lung was a 2.0 x 2.2 cm (SUV 12.4) previously 1.1 x 0.9 cm.  In the  anterior right middle lobe was a 1.1 x 1.1 cm (SUV 2.9), previously 0.9 x 0.9 cm.  The subpleural mass along the anterior of the left upper  lobe was 2.1 x 3.5 cm (not FDG avid), previoulsy 2.2 x 3.4 cm.  Bone scan on 03/05/2016 revealed no evidence of metastatic disease.  She is s/p 3 cycles of trabectedin (Yondelis) (03/13/2016 - 05/01/2016) with Neulasta support.  She had a grade I liver toxicity secondary to Yondelis. Chest CT on 04/28/2016 revealed innumerable pulmonary metastases which had slightly increased (95m) in size since 02/26/2016.  Symptomatically, she feels good.  ANC is 1,300.  CK is 229 (1.3 x ULN).  Plan: 1.  Review [facility] labs.  Discuss elevated CK.   2.  Cycle #4 Yondelis today. 3.  RTC tomorrow for disconnect and OnPro Neulasta. 4.  Continue Xarelto. 5.  RTC in 3 weeks for MD assessment, review of [facility] labs (CBC with diff, CMP, Phos, CK), and cycle #5 Yondelis.   MLequita Asal MD 06/05/2016, 10:32 AM    Addendum:  Prior to each cycle of Yondelis, AMindenminesshould be >= 1000 (patient has chronic neutropenia), platelets >= 100,000, bilirubin <= ULN, and alkaline phosphatase, ALT, AST, and CPK  <= 2.5 ULN).    During treatment, AST or ALT >5 times ULN during prior cycle: Delay dose for up to 3 weeks and reduce the next dose by one dose level  Alkaline phosphatase >2.5 times ULN: Delay dose for up to 3 weeks and reduce the next dose by one dose level.  Creatine phosphokinase >5 times ULN during prior cycle: Delay dose for up to 3 weeks and reduce the next dose by one dose level  First dose reduction: 1.2 mg/m2 once every 3 weeks. Second dose reduction: 1 mg/m2 once every 3 weeks

## 2016-06-05 NOTE — Progress Notes (Signed)
Patient here for pre treatment assessment. No concerns today.

## 2016-06-06 ENCOUNTER — Inpatient Hospital Stay: Payer: 59

## 2016-06-06 VITALS — BP 106/72 | HR 96 | Temp 97.3°F | Resp 20

## 2016-06-06 DIAGNOSIS — C55 Malignant neoplasm of uterus, part unspecified: Secondary | ICD-10-CM | POA: Diagnosis not present

## 2016-06-06 MED ORDER — HEPARIN SOD (PORK) LOCK FLUSH 100 UNIT/ML IV SOLN
500.0000 [IU] | Freq: Once | INTRAVENOUS | Status: AC | PRN
  Administered 2016-06-06: 500 [IU]
  Filled 2016-06-06: qty 5

## 2016-06-06 MED ORDER — SODIUM CHLORIDE 0.9% FLUSH
10.0000 mL | INTRAVENOUS | Status: DC | PRN
  Administered 2016-06-06: 10 mL
  Filled 2016-06-06: qty 10

## 2016-06-06 MED ORDER — PEGFILGRASTIM 6 MG/0.6ML ~~LOC~~ PSKT
6.0000 mg | PREFILLED_SYRINGE | Freq: Once | SUBCUTANEOUS | Status: AC
  Administered 2016-06-06: 6 mg via SUBCUTANEOUS
  Filled 2016-06-06: qty 0.6

## 2016-06-12 ENCOUNTER — Other Ambulatory Visit: Payer: Self-pay | Admitting: Hematology and Oncology

## 2016-06-25 ENCOUNTER — Encounter: Payer: Self-pay | Admitting: Hematology and Oncology

## 2016-06-26 ENCOUNTER — Emergency Department: Payer: 59

## 2016-06-26 ENCOUNTER — Inpatient Hospital Stay (HOSPITAL_BASED_OUTPATIENT_CLINIC_OR_DEPARTMENT_OTHER): Payer: 59 | Admitting: Hematology and Oncology

## 2016-06-26 ENCOUNTER — Other Ambulatory Visit: Payer: Self-pay

## 2016-06-26 ENCOUNTER — Inpatient Hospital Stay: Payer: 59

## 2016-06-26 ENCOUNTER — Emergency Department
Admission: EM | Admit: 2016-06-26 | Discharge: 2016-06-26 | Disposition: A | Discharge status: Home / Self Care | Payer: 59 | Attending: Emergency Medicine | Admitting: Emergency Medicine

## 2016-06-26 ENCOUNTER — Other Ambulatory Visit: Payer: Self-pay | Admitting: Internal Medicine

## 2016-06-26 ENCOUNTER — Telehealth: Payer: Self-pay | Admitting: Internal Medicine

## 2016-06-26 ENCOUNTER — Encounter: Payer: Self-pay | Admitting: Hematology and Oncology

## 2016-06-26 ENCOUNTER — Other Ambulatory Visit: Payer: Self-pay | Admitting: Hematology and Oncology

## 2016-06-26 VITALS — BP 132/89 | HR 109 | Temp 96.9°F | Resp 16 | Ht 64.0 in | Wt 164.5 lb

## 2016-06-26 DIAGNOSIS — Z86718 Personal history of other venous thrombosis and embolism: Secondary | ICD-10-CM | POA: Insufficient documentation

## 2016-06-26 DIAGNOSIS — Z91040 Latex allergy status: Secondary | ICD-10-CM | POA: Insufficient documentation

## 2016-06-26 DIAGNOSIS — I10 Essential (primary) hypertension: Secondary | ICD-10-CM | POA: Insufficient documentation

## 2016-06-26 DIAGNOSIS — C7800 Secondary malignant neoplasm of unspecified lung: Secondary | ICD-10-CM

## 2016-06-26 DIAGNOSIS — Z8542 Personal history of malignant neoplasm of other parts of uterus: Secondary | ICD-10-CM | POA: Insufficient documentation

## 2016-06-26 DIAGNOSIS — C55 Malignant neoplasm of uterus, part unspecified: Secondary | ICD-10-CM

## 2016-06-26 DIAGNOSIS — I82402 Acute embolism and thrombosis of unspecified deep veins of left lower extremity: Secondary | ICD-10-CM

## 2016-06-26 DIAGNOSIS — R0789 Other chest pain: Secondary | ICD-10-CM | POA: Diagnosis present

## 2016-06-26 DIAGNOSIS — R748 Abnormal levels of other serum enzymes: Secondary | ICD-10-CM

## 2016-06-26 DIAGNOSIS — Z79899 Other long term (current) drug therapy: Secondary | ICD-10-CM

## 2016-06-26 DIAGNOSIS — R600 Localized edema: Secondary | ICD-10-CM | POA: Diagnosis not present

## 2016-06-26 LAB — CBC
HCT: 27.2 % — ABNORMAL LOW (ref 35.0–47.0)
HEMOGLOBIN: 9.4 g/dL — AB (ref 12.0–16.0)
MCH: 34.8 pg — AB (ref 26.0–34.0)
MCHC: 34.6 g/dL (ref 32.0–36.0)
MCV: 100.6 fL — AB (ref 80.0–100.0)
Platelets: 152 10*3/uL (ref 150–440)
RBC: 2.71 MIL/uL — AB (ref 3.80–5.20)
RDW: 17.3 % — ABNORMAL HIGH (ref 11.5–14.5)
WBC: 2.7 10*3/uL — ABNORMAL LOW (ref 3.6–11.0)

## 2016-06-26 LAB — HEPATIC FUNCTION PANEL
ALT: 78 U/L — ABNORMAL HIGH (ref 14–54)
AST: 68 U/L — ABNORMAL HIGH (ref 15–41)
Albumin: 4.1 g/dL (ref 3.5–5.0)
Alkaline Phosphatase: 229 U/L — ABNORMAL HIGH (ref 38–126)
Bilirubin, Direct: <0.1 mg/dL — ABNORMAL LOW (ref 0.1–0.5)
Total Bilirubin: 0.6 mg/dL (ref 0.3–1.2)
Total Protein: 7.3 g/dL (ref 6.5–8.1)

## 2016-06-26 LAB — BASIC METABOLIC PANEL
ANION GAP: 6 (ref 5–15)
BUN: 8 mg/dL (ref 6–20)
CHLORIDE: 104 mmol/L (ref 101–111)
CO2: 27 mmol/L (ref 22–32)
CREATININE: 0.61 mg/dL (ref 0.44–1.00)
Calcium: 9.4 mg/dL (ref 8.9–10.3)
GFR calc non Af Amer: >60 mL/min (ref >60)
GLUCOSE: 97 mg/dL (ref 65–99)
Potassium: 3.6 mmol/L (ref 3.5–5.1)
Sodium: 137 mmol/L (ref 135–145)

## 2016-06-26 LAB — TROPONIN I
Troponin I: 0.04 ng/mL (ref <0.03)
Troponin I: 0.05 ng/mL (ref <0.03)
Troponin I: 0.07 ng/mL (ref <0.03)

## 2016-06-26 LAB — CK: Total CK: 506 U/L — ABNORMAL HIGH (ref 38–234)

## 2016-06-26 MED ORDER — SODIUM CHLORIDE 0.9 % IV BOLUS (SEPSIS)
1000.0000 mL | Freq: Once | INTRAVENOUS | Status: AC
  Administered 2016-06-26: 1000 mL via INTRAVENOUS

## 2016-06-26 NOTE — Progress Notes (Signed)
[facility] day:  06/26/2016  Chief Complaint: Cassidy Bradley is an 55 y.o. female with metastatic uterine leiomyosarcoma who is seen for prior to cycle #5 trabectedin (Yondelis).  HPI: The patient was last seen in the medical oncology clinic on 06/05/2016.  At that time, she was doing well.  ANC was 1300.  CPK was 229.  She received cycle #4 trabectedin with Neulasta support.  During the interim, she has done well.  She only notes some "funny indigestion".  She denies any chest pain or shortness of breath.  She denies any bone pain.  She denies any nausea or vomiting.  She denies any change in left lower extremity edema.   Past Medical History  Diagnosis Date  . Breast lump 2006     both breast   . Chest wall mass   . Cancer ([place])   . Uterine cancer [facility])     Past Surgical History  Procedure Laterality Date  . Cholecystectomy  2001  . Ankle surgery  2005  . Cesarean section  1993  . Back surgery  2005  . Breast biopsy Bilateral   . Colonoscopy    . Abdominal hysterectomy  01/30/15   Family History  Problem Relation Age of Onset  . Breast cancer Sister 46  . Breast cancer Maternal Aunt   . Breast cancer Maternal Grandmother 60  . Stomach cancer Maternal Aunt   . Lung cancer Maternal Grandfather   . Colon polyps Mother   . Cancer - Colon Cousin 12    first cousin  . Lupus Father   . Esophageal cancer Maternal Uncle     Social History:  reports that she has never smoked. She has never used smokeless tobacco. She reports that she does not drink alcohol or use illicit drugs.   The patient is alone today.  Allergies:  Allergies  Allergen Reactions  . Pravastatin Hives and Nausea And Vomiting  . Sulfa Antibiotics Hives and Swelling  . Latex Rash  . Other Rash and Other (See Comments)    Pt states that she is allergic to spandex.   Current Medications: Current Outpatient Prescriptions  Medication Sig Dispense Refill  .  lidocaine-prilocaine (EMLA) cream Apply 1 application topically as needed. Apply one hour prior to chemotherapy (Patient taking differently: Apply 1 application topically as needed (prior to accessing pts port). Pt applies one hour prior to chemotherapy.) 30 g 1  . loratadine (CLARITIN) 10 MG tablet Take 10 mg by mouth daily.    . Multiple Vitamins-Minerals (ALIVE WOMENS ENERGY) TABS Take by mouth.    Alveda Reasons 20 MG TABS tablet Take 1 tablet by mouth  daily with supper 90 tablet 1  . ondansetron (ZOFRAN) 8 MG tablet Take 8 mg by mouth every 8 (eight) hours as needed for nausea or vomiting. Reported on 06/26/2016    . polyethylene glycol powder (GLYCOLAX/MIRALAX) powder Take by mouth. Reported on 06/26/2016     No current facility-administered medications for this visit.   Facility-Administered Medications Ordered in Other Visits  Medication Dose Route Frequency Provider Last Rate Last Dose  . 0.9 %  sodium chloride infusion   Intravenous Continuous Lequita Asal, MD   Stopped at 05/01/16 1340  . 0.9 %  sodium chloride infusion   Intravenous Continuous Lequita Asal, MD   Stopped at 06/05/16 1235  . heparin lock flush 100 unit/mL  500 Units Intracatheter Once PRN Lequita Asal, MD  500 Units at 04/03/16 1615  . heparin lock flush 100 unit/mL  500 Units Intracatheter Once PRN Lequita Asal, MD   500 Units at 05/01/16 1306  . heparin lock flush 100 unit/mL  500 Units Intracatheter Once PRN Lequita Asal, MD      . sodium chloride 0.9 % injection 10 mL  10 mL Intracatheter PRN Lequita Asal, MD      . sodium chloride flush (NS) 0.9 % injection 10 mL  10 mL Intracatheter PRN Lequita Asal, MD   10 mL at 04/03/16 1241  . sodium chloride flush (NS) 0.9 % injection 10 mL  10 mL Intracatheter PRN Lequita Asal, MD   10 mL at 05/01/16 1225  . sodium chloride flush (NS) 0.9 % injection 10 mL  10 mL Intracatheter PRN Lequita Asal, MD   10 mL at 06/05/16 1135     Review of Systems:  GENERAL:  Feels good.  No fevers or sweats. Weight down 4 pounds. PERFORMANCE STATUS (ECOG):  2 HEENT:  No visual changes,sore throat, mouth sores or tenderness. Lungs: Shortness of breath with exertion (stable).  No cough.  No hemoptysis. Cardiac:  No chest pain, palpitations, orthopnea, or PND. GI:  Reflux, "funny indigestion".  No nausea, vomiting, constipation, melena, or hematochezia. GU:  No urgency, frequency, dysuria, or hematuria. Musculoskeletal:  No bone or back pain.  No joint pain.  No muscle tenderness. Extremities: Left lower extremity swelling, stable. Skin:  No rashes or skin changes. Neuro:  No headache, numbness or weakness, balance or coordination issues. Endocrine:  No diabetes, thyroid issues, hot flashes or night sweats. Psych:  No mood changes, depression or anxiety. Pain:  No focal pain. Review of systems:  All other systems reviewed and found to be negative.                        Physical Exam: Blood pressure 132/89, pulse 109, temperature 96.9 F (36.1 C), temperature source Tympanic, resp. rate 16, height _0  (1.626 m), weight 164 lb 7.4 oz (74.6 kg).  GENERAL:  Well developed, well nourished, woman sitting comfortably in the exam room in no acute distress. MENTAL STATUS:  Alert and oriented to person, place and time. HEAD:  Short graying hair.  Normocephalic, atraumatic, face symmetric, no Cushingoid features. EYES:  Glasses.  Brown eyes.  Pupils equal round and reactive to light and accomodation.  No conjunctivitis or scleral icterus. ENT:  Oropharynx clear without lesion.  Tongue normal. Mucous membranes moist.  RESPIRATORY:  Clear to auscultation without rales, wheezes or rhonchi. CARDIOVASCULAR:  Regular rate and rhythm without murmur, rub or gallop. No JVD. ABDOMEN:  Soft, non-tender, with active bowel sounds, and no hepatosplenomegaly.  No masses. SKIN:  No rashes, ulcers or lesions. EXTREMITIES:  1+ left lower extremity  edema below the knee (stable). LYMPH NODES: No palpable cervical, supraclavicular, axillary or inguinal adenopathy  NEUROLOGICAL: Appropriate. PSYCH:  Appropriate.  Laboratory testing: [facility] labs on 02/28/2016 included a hematocrit 36.1, hemoglobin 12.1, platelets 206,000, white count 13,200 with an [place] of 11,400.  Comprehensive metabolic panel included a creatinine of 0.67. Liver function tests included an alkaline phosphatase of 122 (39-117).    [facility] labs on 03/12/2016 included a comprehensive metabolic panel included a creatinine of 0.57.  Liver function tests included an alkaline phosphatase of 118 (39-117) post Neupogen.  Phosphorus was 3.4.  Creatinine kinase was 156 (24-173).  [facility] labs on 04/02/2016 included hematocrit of  33.3, hemoglobin 11.0, platelets 229,000, WBC 2700 with an ANC of 1400.  Comprehensive metabolic panel included a creatinine of 0.61.  Liver function tests included an alkaline phosphatase of 129 (39-117), AST 54 (0-40) and ALT 76 (0-32).  Phosphorus was 3.9.  Creatinine kinase was 120 (24-173).  [facility] labs on 04/23/2016 included hematocrit of 30.4, hemoglobin 10.0, platelets 266,000, WBC 3200 with an ANC of 1600.  Comprehensive metabolic panel included a creatinine of 0.68.  Liver function tests included an alkaline phosphatase of 195 (39-117), AST 45 (0-40) and ALT 69 (0-32).  Phosphorus was 3.2.  Creatinine kinase was 258 (24-173).  [facility] labs on 04/30/2016 included hematocrit of 32.0, hemoglobin 10.8, platelets 287,000, WBC 2100 with an ANC of 900.  Comprehensive metabolic panel included a creatinine of 0.68.  Liver function tests included an alkaline phosphatase of 166 (39-117), AST 42 (0-40) and ALT 44 (0-32).  Phosphorus was 4.0.  Creatinine kinase was 182 (24-173).  [facility] labs on 05/21/2016 included hematocrit of 28.9, hemoglobin 9.4, platelets 167,000, WBC 2500 with an ANC of 1,100.  Comprehensive metabolic panel included a creatinine of 0.68.  Liver  function tests included an alkaline phosphatase of 244 (39-117), AST 63 (0-40) and ALT 86 (0-32).  Phosphorus was 4.0.  Creatinine kinase was 433 (24-173).  [facility] labs on 06/04/2016 included hematocrit of 32.7, hemoglobin 10.6, platelets 287,000, WBC 2800 with an ANC of 1,300. Comprehensive metabolic panel included a creatinine of 0.68. Liver function tests included an alkaline phosphatase of 241 (39-117), AST 64 (0-40) and ALT 67 (0-32). Phosphorus was 4.0. Creatinine kinase was 229 (24-173).  [facility] labs on 06/25/2016 included hematocrit of 28.1, hemoglobin 9.3, platelets 155,000, WBC 2500 with an ANC of 1,000.  Comprehensive metabolic panel included a creatinine of 0.62.  Liver function tests included an alkaline phosphatase of 240 (39-117), AST 72 (0-40) and ALT 80 (0-32).  Phosphorus was 3.6.  Creatinine kinase was 8.6 (0-5.3).   No visits with results within 3 Day(s) from this visit. Latest known visit with results is:  Appointment on 05/29/2016  Component Date Value Ref Range Status  . WBC 05/29/2016 2.6* 3.6 - 11.0 K/uL Final  . RBC 05/29/2016 2.97* 3.80 - 5.20 MIL/uL Final  . Hemoglobin 05/29/2016 10.0* 12.0 - 16.0 g/dL Final  . HCT 05/29/2016 29.1* 35.0 - 47.0 % Final  . MCV 05/29/2016 97.9  80.0 - 100.0 fL Final  . MCH 05/29/2016 33.6  26.0 - 34.0 pg Final  . MCHC 05/29/2016 34.4  32.0 - 36.0 g/dL Final  . RDW 05/29/2016 20.5* 11.5 - 14.5 % Final  . Platelets 05/29/2016 269  150 - 440 K/uL Final  . Neutrophils Relative % 05/29/2016 51   Final  . Neutro Abs 05/29/2016 1.3* 1.4 - 6.5 K/uL Final  . Lymphocytes Relative 05/29/2016 29   Final  . Lymphs Abs 05/29/2016 0.7* 1.0 - 3.6 K/uL Final  . Monocytes Relative 05/29/2016 18   Final  . Monocytes Absolute 05/29/2016 0.5  0.2 - 0.9 K/uL Final  . Eosinophils Relative 05/29/2016 1   Final  . Eosinophils Absolute 05/29/2016 0.0  0 - 0.7 K/uL Final  . Basophils Relative 05/29/2016 1   Final  . Basophils Absolute 05/29/2016 0.0   0 - 0.1 K/uL Final   Assessment:  SHIAN GOODNOW is a 55 y.o. female 55 y.o. female with stage IV uterine leiomyosarcoma. She underwent TAH/BSO on 01/30/2015. Pathology confirmed high grade leiomyosarcoma with 2 large adjacent tumor nodules (10 cm and 5 cm)  and a separate 0.5 cm serosal nodule.   Chest, abdomen, and pelvic CT scan on 02/12/2015 revealed a 1.7 x 2.3 cm right external iliac node. Pulmonary nodules were scattered throughout the lungs bilaterally. There was left internal mammary chain adenopathy measuring up to 3.6 x 2 cm.  She underwent anterior chest wall pleural-based CT guided biopsy on 02/21/2015. Pathology was consistent with metastatic leiomyosarcoma. Bone scan on 02/28/2015 revealed no evidence of metastatic disease with indeterminate uptake in the left posterior rib.   She has a fraternal twin who developed breast cancer at age 73. Several other family members have malignancies. 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  Prechemotherapy labs from [facility] revealed leukopenia (WBC 2300 with ANC 900). Work-up to date is negative. She has a family history of leukopenia. Bone marrow on 03/08/2015 revealed no evidence of neoplasia or metastatic disease. Marrow was normocellular for age (30-50%) with trilineage hematopoiesis. There was no increase in marrow reticulin fibers. Flow cytometry and cytogenetics were normal.  She received 4 cycles of gemcitabine and Taxotere (03/20/2015 - 05/22/2015) with Neulasta support. She received gemcitabine alone on 05/22/2015.  She was admitted on 05/24/2015 with an acute febrile illness. All cultures were negative.   Chest CT on 05/25/2015 revealed early progressive disease. Abdominal and pelvic CT scan on 05/30/2015 revealed stable to slightly increased right external iliac lymph node (1.8 x 2.5 cm).   Echocardiogram on 06/18/2015 revealed EF of  60-65%.  Echo on 10/22/2015 revealed an EF of 65%.  MUGA on 12/25/2015 revealed an EF of 52%.  Echo on 03/03/2016 revealed an EF of 50%.  Echo on 05/21/2016 revealed an EF of 55%.  Bilateral lower extremity duplex on 05/30/2015 was negative.  Left upper extremity duplex on 06/08/2015 was negative.  Left lower extremity duplex on 07/19/2015 documented a DVT.   Left lower extremity duplex on 01/31/2016 revealed re-cannulization of the prior DVT involving the left popliteal vein.  She is on Xarelto.   She received 8 cycles of adriamycin (06/07/2015 - 12/06/2015) with Neulasta (On-Pro) support.  Cycle #3 was complicated by an acute  left lower extremity DVT.  Cycle #6 was postponed a week secondary to neutropenia ([place] [address]).  Zinecard was added with cycle #7.  Olaratumab [name] was added with cycle #8.  Chest, abdomen, and pelvic CT scan on 08/07/2015 revealed a partial treatment response with decrease anterior left upper pleural metastasis, stable to decreased pulmonary metastases and decreased right external iliac adenopathy. There were no new sites of disease.  Chest, abdomen, and pelvic CT scan on 10/22/2015 revealed mild decrease in left anterior chest wall mass/internal mammary lymphadenopathy (3.1 x 4.6 cm to 2.5 x 4.4 cm).  There was decreased mild right external iliac lymphadenopathy (1.4 cm to 1.2 cm).  There was diffuse stable bilateral pulmonary metastases (largest 1.1 cm in anterior right lung).  There was no new or progressive metastatic disease.  Chest CT on 12/25/2015 on revealed slight progression in pulmonary nodules (index nodules: 5 mm to 11 mm and 8 mm to 11 mm).  The dominant soft tissue lesion along the left hemi-thorax was 2.2 x 4.3 cm (stable).  She received 3 cycles of single agent olaratumab [name] (12/27/2015 -02/08/2016).  She required GCSF secondary to neutropenia.  PET scan on 02/26/2016 revealed progression of numerous pulmonary nodules/metastasis and mild progression  (1.2 cm with SUV 7) of right pelvic nodal metastasis.  In the right lung apex, there was a 1.3 x  1.0 cm (SUV 6.2) previously 0.9 x 0.6 cm.  In the anterior left lung was a 2.0 x 2.2 cm (SUV 12.4) previously 1.1 x 0.9 cm.  In the anterior right middle lobe was a 1.1 x 1.1 cm (SUV 2.9), previously 0.9 x 0.9 cm.  The subpleural mass along the anterior of the left upper lobe was 2.1 x 3.5 cm (not FDG avid), previously 2.2 x 3.4 cm.  Bone scan on 03/05/2016 revealed no evidence of metastatic disease.  She is s/p 4 cycles of trabectedin (Yondelis) (03/13/2016 - 05/01/2016; 06/05/2016) with Neulasta support.  She had a grade I liver toxicity secondary to Yondelis. Chest CT on 04/28/2016 revealed innumerable pulmonary metastases which had slightly increased (33m) in size since 02/26/2016.  Symptomatically, she has had some funny ingestion.  ANC is 1,000.  ALT is 2.5 x ULN.  Alkaline phosphatase is 2 x ULN.  CPK was inadvertantly not done.  Creatinine kinase was 8.6 (0-5.3).  Troponin I was 0.04 (< 0.03).  Plan: 1.  Review [facility] labs.  Discuss elevated CK.  Discuss checking CPK and troponins.  Discuss repeating LFTs. 2.  Discuss postponing chemotherapy until repeat labs back.   3.  Discuss elevated troponin and evaluation in the ER. 4.  Discuss CK of 506 (38-234): 2.16 ULN.  Alk phos 229: 1.81 x ULN  ALT 78: 1.44 x ULN. 5.  Continue Xarelto. 6.  RTC in 1 week for MD assessment, review of [facility] labs (CBC with diff, CMP, CK), and cycle #5 Yondelis.   MLequita Asal MD 06/26/2016, 10:13 AM   Addendum: Prior to each cycle of Yondelis, ANC should be >= 1000 (patient has chronic neutropenia), platelets >= 100,000, bilirubin <= ULN, and alkaline phosphatase, ALT, AST, and CPK <= 2.5 ULN).   During treatment, AST or ALT >5 times ULN during prior cycle: Delay dose for up to 3 weeks and reduce the next dose by one dose level Alkaline phosphatase >2.5 times ULN: Delay dose for up to 3 weeks and reduce  the next dose by one dose level. Creatine phosphokinase >5 times ULN during prior cycle: Delay dose for up to 3 weeks and reduce the next dose by one dose level  First dose reduction: 1.2 mg/m2 once every 3 weeks. Second dose reduction: 1 mg/m2 once every 3 weeks

## 2016-06-26 NOTE — ED Provider Notes (Signed)
[facility] Emergency Department Provider Note  Time seen: 6:50 PM  I have reviewed the triage vital signs and the nursing notes.   HISTORY  Chief Complaint Chest Pain    HPI Cassidy Bradley is a 55 y.o. female with a past medical history of uterine cancer, chest mass, on chemotherapy, who presents to the emergency department for abnormal labs. According to the patient she was at the cancer center for her chemotherapy infusion. While there they checked labs including a CK which came back elevated at 500. Slight elevation of troponin. Patient was told to come to the emergency department due to her abnormal labs. The patient does state for the past several months she has been having slight left chest discomfort. Denies any pleuritic component. Denies any worsening with movement. She states this is approximately the area where she has a chest mass and she always thought it was related to that. Denies any increase in the discomfort with exertion. Denies any diaphoresis or shortness of breath.     Past Medical History  Diagnosis Date  . Breast lump 2006     both breast   . Chest wall mass   . Cancer ([place])   . Uterine cancer [facility])     Patient Active Problem List   Diagnosis Date Noted  . Elevated CK 05/22/2016  . DVT of leg (deep venous thrombosis) ([place]) 08/11/2015  . Reflux 08/11/2015  . Left leg swelling 07/19/2015  . Acute deep vein thrombosis (DVT) of popliteal vein of left lower extremity ([place]) 07/19/2015  . Drug reaction 05/26/2015  . Acute febrile illness 05/25/2015  . Elevated LFTs 05/25/2015  . Anemia 05/25/2015  . Sepsis ([place]) 05/25/2015  . Anemia of chronic disease 05/25/2015  . Candidiasis of mouth 05/25/2015  . Pulmonary nodules   . Uterine leiomyosarcoma ([place]) 03/02/2015  . Encounter for screening colonoscopy 08/25/2014  . Allergic rhinitis 08/23/2014  . Hypercholesterolemia 08/23/2014  . Climacteric 08/23/2014  . Headache, migraine  08/23/2014  . Lump or mass in breast 06/23/2013  . Essential hypertension, benign 03/30/2013  . Benign essential HTN 03/30/2013  . Benign breast cyst in female 03/29/2013  . Cyst (solitary) of breast 03/29/2013    Past Surgical History  Procedure Laterality Date  . Cholecystectomy  2001  . Ankle surgery  2005  . Cesarean section  1993  . Back surgery  2005  . Breast biopsy Bilateral   . Colonoscopy    . Abdominal hysterectomy  01/30/15    Current Outpatient Rx  Name  Route  Sig  Dispense  Refill  . lidocaine-prilocaine (EMLA) cream   Topical   Apply 1 application topically as needed. Apply one hour prior to chemotherapy Patient taking differently: Apply 1 application topically as needed (prior to accessing pts port). Pt applies one hour prior to chemotherapy.   30 g   1   . loratadine (CLARITIN) 10 MG tablet   Oral   Take 10 mg by mouth daily.         . Multiple Vitamins-Minerals (ALIVE WOMENS ENERGY) TABS   Oral   Take by mouth.         . ondansetron (ZOFRAN) 8 MG tablet   Oral   Take 8 mg by mouth every 8 (eight) hours as needed for nausea or vomiting. Reported on 06/26/2016         . polyethylene glycol powder (GLYCOLAX/MIRALAX) powder   Oral   Take by mouth. Reported on 06/26/2016         .  XARELTO 20 MG TABS tablet      Take 1 tablet by mouth  daily with supper   90 tablet   1     Allergies Pravastatin; Sulfa antibiotics; Latex; and Other  Family History  Problem Relation Age of Onset  . Breast cancer Sister 5  . Breast cancer Maternal Aunt   . Breast cancer Maternal Grandmother 60  . Stomach cancer Maternal Aunt   . Lung cancer Maternal Grandfather   . Colon polyps Mother   . Cancer - Colon Cousin 53    first cousin  . Lupus Father   . Esophageal cancer Maternal Uncle     Social History Social History  Substance Use Topics  . Smoking status: Never Smoker   . Smokeless tobacco: Never Used  . Alcohol Use: No    Review of  Systems Constitutional: Negative for fever. Cardiovascular: Mild left chest pain times several months. Respiratory: Negative for shortness of breath. Gastrointestinal: Negative for abdominal pain Musculoskeletal: Negative for back pain Neurological: Negative for headache 10-point ROS otherwise negative.  ____________________________________________   PHYSICAL EXAM:  VITAL SIGNS: ED Triage Vitals  Enc Vitals Group     BP 06/26/16 1450 138/90 mmHg     Pulse Rate 06/26/16 1450 100     Resp 06/26/16 1450 18     Temp 06/26/16 1450 97.9 F (36.6 C)     Temp src --      SpO2 06/26/16 1450 100 %     Weight 06/26/16 1450 163 lb (73.936 kg)     Height 06/26/16 1450 5\' 4"  (1.626 m)     Head Cir --      Peak Flow --      Pain Score 06/26/16 1457 7     Pain Loc --      Pain Edu? --      Excl. in [place]? --     Constitutional: Alert and oriented. Well appearing and in no distress. Eyes: Normal exam ENT   Head: Normocephalic and atraumatic.   Mouth/Throat: Mucous membranes are moist. Cardiovascular: Normal rate, regular rhythm. No murmur Respiratory: Normal respiratory effort without tachypnea nor retractions. Breath sounds are clear. Nontender to palpation.  Gastrointestinal: Soft and nontender. No distention.   Musculoskeletal: Nontender with normal range of motion in all extremities.  Neurologic:  Normal speech and language. No gross focal neurologic deficits  Skin:  Skin is warm, dry and intact.  Psychiatric: Mood and affect are normal.   ____________________________________________    EKG  EKG reviewed and interpreted by myself shows sinus tachycardia 104 bpm, narrow QRS, normal axis, normal intervals, nonspecific but no concerning ST changes.  ____________________________________________    RADIOLOGY  Chest x-ray shows bilateral pulmonary nodules but no acute change.  ____________________________________________    INITIAL IMPRESSION / ASSESSMENT AND PLAN /  ED COURSE  Pertinent labs & imaging results that were available during my care of the patient were reviewed by me and considered in my medical decision making (see chart for details).  Patient presents the emergency department for left-sided chest discomfort and an abnormal creatinine kinase. Patient's CKs 506, troponin slightly elevated 0.05 however the patient's baseline troponin is slightly elevated. We'll discuss with oncology, we'll IV hydrate, send a repeat troponin and closely monitor in the emergency department until we can clarify with oncology the plan for the patient. She has lower extremity swelling although she had an ultrasound performed recently showing no DVT. The patient is on Xarelto.  ----------------------------------------- 8:54 PM on  06/26/2016 -----------------------------------------  Patient's repeat troponin slightly elevated 0.07 however all the patient's troponins have been slightly elevated. I discussed the patient with Dr. Mike Gip who states the only reason she center to the department was because her CK was slightly elevated and she did not have time to hold the patient for a repeat troponin. Patient's CK-MB was elevated however her CK-MB fraction is still low relative to her total CK. Patient has received 1 L of IV fluids. Patient denies any chest discomfort at this time. We will discharge the patient home with PCP follow-up. I did discuss cardiology follow-up with the patient. ____________________________________________   FINAL CLINICAL IMPRESSION(S) / ED DIAGNOSES  Chest pain Elevated creatinine kinase  Harvest Dark, MD 06/26/16 2055

## 2016-06-26 NOTE — ED Notes (Signed)
Pt requesting to speak with dr. Would prefer not to have an iv inserted. Dr in with pt -

## 2016-06-26 NOTE — ED Notes (Signed)
  Pt sent to ER for further evaluation of elevated CK per [place]. PT states epigastric pain X 2 days. Pt alert and oriented X4, active, cooperative, pt in NAD. RR even and unlabored, color WNL.

## 2016-06-26 NOTE — Discharge Instructions (Signed)
You have been seen in the emergency department today for an elevated creatinine kinase level. As we discussed your heart enzyme is slightly elevated today. Please follow-up with cardiology by calling the number provided to arrange a follow-up appointment. Return to the emergency department for any worsening chest pain, any trouble breathing, or any other symptom personally concerning to self. Please drink plenty of fluids and follow up with oncology as scheduled.

## 2016-06-26 NOTE — Progress Notes (Signed)
No changes since last visit same discomfort mid chest.  Elevated troponin called at 14:04 from [facility] in lab.  MD made aware.  POC discussed with pt by MD.  Pt escorted via wheelchair to ER no complications in route.  Husband aware.  Pt had no other symptoms other than mild midline chest discomfort.

## 2016-06-26 NOTE — Telephone Encounter (Signed)
Received a call from [place] last night- CK- MB 8.6/ wbc- 2.5.  GB

## 2016-06-28 ENCOUNTER — Encounter: Payer: Self-pay | Admitting: Hematology and Oncology

## 2016-06-30 ENCOUNTER — Other Ambulatory Visit: Payer: Self-pay

## 2016-06-30 DIAGNOSIS — C55 Malignant neoplasm of uterus, part unspecified: Secondary | ICD-10-CM

## 2016-07-01 ENCOUNTER — Other Ambulatory Visit: Payer: Self-pay | Admitting: Nurse Practitioner

## 2016-07-01 ENCOUNTER — Other Ambulatory Visit: Payer: Self-pay | Admitting: Hematology and Oncology

## 2016-07-02 ENCOUNTER — Encounter: Payer: Self-pay | Admitting: Hematology and Oncology

## 2016-07-02 ENCOUNTER — Inpatient Hospital Stay (HOSPITAL_BASED_OUTPATIENT_CLINIC_OR_DEPARTMENT_OTHER): Payer: 59 | Admitting: Hematology and Oncology

## 2016-07-02 ENCOUNTER — Inpatient Hospital Stay: Payer: 59 | Attending: Hematology and Oncology

## 2016-07-02 ENCOUNTER — Ambulatory Visit: Payer: 59

## 2016-07-02 VITALS — BP 130/85 | HR 85 | Temp 97.2°F

## 2016-07-02 VITALS — BP 132/88 | HR 94 | Temp 97.2°F | Wt 160.8 lb

## 2016-07-02 DIAGNOSIS — Z7689 Persons encountering health services in other specified circumstances: Secondary | ICD-10-CM | POA: Diagnosis not present

## 2016-07-02 DIAGNOSIS — D649 Anemia, unspecified: Secondary | ICD-10-CM | POA: Diagnosis not present

## 2016-07-02 DIAGNOSIS — Z86718 Personal history of other venous thrombosis and embolism: Secondary | ICD-10-CM | POA: Insufficient documentation

## 2016-07-02 DIAGNOSIS — M6282 Rhabdomyolysis: Secondary | ICD-10-CM | POA: Insufficient documentation

## 2016-07-02 DIAGNOSIS — D72819 Decreased white blood cell count, unspecified: Secondary | ICD-10-CM | POA: Diagnosis not present

## 2016-07-02 DIAGNOSIS — R748 Abnormal levels of other serum enzymes: Secondary | ICD-10-CM

## 2016-07-02 DIAGNOSIS — C782 Secondary malignant neoplasm of pleura: Secondary | ICD-10-CM | POA: Insufficient documentation

## 2016-07-02 DIAGNOSIS — Z7901 Long term (current) use of anticoagulants: Secondary | ICD-10-CM | POA: Diagnosis not present

## 2016-07-02 DIAGNOSIS — Z79899 Other long term (current) drug therapy: Secondary | ICD-10-CM

## 2016-07-02 DIAGNOSIS — Z5111 Encounter for antineoplastic chemotherapy: Secondary | ICD-10-CM | POA: Diagnosis not present

## 2016-07-02 DIAGNOSIS — C7802 Secondary malignant neoplasm of left lung: Secondary | ICD-10-CM | POA: Diagnosis not present

## 2016-07-02 DIAGNOSIS — C55 Malignant neoplasm of uterus, part unspecified: Secondary | ICD-10-CM

## 2016-07-02 DIAGNOSIS — C775 Secondary and unspecified malignant neoplasm of intrapelvic lymph nodes: Secondary | ICD-10-CM | POA: Insufficient documentation

## 2016-07-02 DIAGNOSIS — C7801 Secondary malignant neoplasm of right lung: Secondary | ICD-10-CM | POA: Diagnosis not present

## 2016-07-02 LAB — CBC WITH DIFFERENTIAL/PLATELET
Basophils Absolute: 0 10*3/uL (ref 0–0.1)
Basophils Relative: 1 %
Eosinophils Absolute: 0 10*3/uL (ref 0–0.7)
Eosinophils Relative: 1 %
HCT: 29 % — ABNORMAL LOW (ref 35.0–47.0)
Hemoglobin: 9.8 g/dL — ABNORMAL LOW (ref 12.0–16.0)
Lymphocytes Relative: 33 %
Lymphs Abs: 0.8 10*3/uL — ABNORMAL LOW (ref 1.0–3.6)
MCH: 34.4 pg — ABNORMAL HIGH (ref 26.0–34.0)
MCHC: 33.7 g/dL (ref 32.0–36.0)
MCV: 102.1 fL — ABNORMAL HIGH (ref 80.0–100.0)
Monocytes Absolute: 0.4 10*3/uL (ref 0.2–0.9)
Monocytes Relative: 18 %
Neutro Abs: 1.1 10*3/uL — ABNORMAL LOW (ref 1.4–6.5)
Neutrophils Relative %: 47 %
Platelets: 213 10*3/uL (ref 150–440)
RBC: 2.84 MIL/uL — ABNORMAL LOW (ref 3.80–5.20)
RDW: 17.5 % — ABNORMAL HIGH (ref 11.5–14.5)
WBC: 2.3 10*3/uL — ABNORMAL LOW (ref 3.6–11.0)

## 2016-07-02 LAB — COMPREHENSIVE METABOLIC PANEL
ALT: 61 U/L — ABNORMAL HIGH (ref 14–54)
AST: 54 U/L — ABNORMAL HIGH (ref 15–41)
Albumin: 4 g/dL (ref 3.5–5.0)
Alkaline Phosphatase: 219 U/L — ABNORMAL HIGH (ref 38–126)
Anion gap: 5 (ref 5–15)
BUN: 11 mg/dL (ref 6–20)
CO2: 27 mmol/L (ref 22–32)
Calcium: 9.3 mg/dL (ref 8.9–10.3)
Chloride: 107 mmol/L (ref 101–111)
Creatinine, Ser: 0.62 mg/dL (ref 0.44–1.00)
GFR calc Af Amer: >60 mL/min (ref >60)
GFR calc non Af Amer: >60 mL/min (ref >60)
Glucose, Bld: 98 mg/dL (ref 65–99)
Potassium: 3.7 mmol/L (ref 3.5–5.1)
Sodium: 139 mmol/L (ref 135–145)
Total Bilirubin: 0.5 mg/dL (ref 0.3–1.2)
Total Protein: 7.3 g/dL (ref 6.5–8.1)

## 2016-07-02 LAB — CK: Total CK: 251 U/L — ABNORMAL HIGH (ref 38–234)

## 2016-07-02 LAB — PHOSPHORUS: Phosphorus: 3.7 mg/dL (ref 2.5–4.6)

## 2016-07-02 MED ORDER — HEPARIN SOD (PORK) LOCK FLUSH 100 UNIT/ML IV SOLN
500.0000 [IU] | Freq: Once | INTRAVENOUS | Status: AC | PRN
  Filled 2016-07-02: qty 5

## 2016-07-02 MED ORDER — SODIUM CHLORIDE 0.9% FLUSH
10.0000 mL | INTRAVENOUS | Status: AC | PRN
  Administered 2016-07-02: 10 mL
  Filled 2016-07-02: qty 10

## 2016-07-02 MED ORDER — PALONOSETRON HCL INJECTION 0.25 MG/5ML
0.2500 mg | Freq: Once | INTRAVENOUS | Status: AC
  Administered 2016-07-02: 0.25 mg via INTRAVENOUS
  Filled 2016-07-02: qty 5

## 2016-07-02 MED ORDER — TRABECTEDIN CHEMO INJECTION 1 MG IV
1.5000 mg/m2 | Freq: Once | INTRAVENOUS | Status: AC
  Administered 2016-07-02: 2.8 mg via INTRAVENOUS
  Filled 2016-07-02: qty 56

## 2016-07-02 MED ORDER — SODIUM CHLORIDE 0.9 % IV SOLN
20.0000 mg | Freq: Once | INTRAVENOUS | Status: AC
  Administered 2016-07-02: 20 mg via INTRAVENOUS
  Filled 2016-07-02: qty 2

## 2016-07-02 NOTE — Progress Notes (Signed)
[facility]-  Cancer Center  Clinic day:  07/02/2016  Chief Complaint: Cassidy Bradley is an 55 y.o. female with metastatic uterine leiomyosarcoma who is seen for prior to cycle #5 trabectedin (Yondelis).  HPI: The patient was last seen in the medical oncology clinic on 06/26/2016.  At that time, cycle #5 was postponed secondary to CK enzymes.  CPK was inadvertantly not done.  Creatinine kinase was 8.6 (0-5.3).  Troponin I was 0.04 (< 0.03).  She was seen to the emergency room for evaluation.  EKG revealed sinus tachycardia 104 bpm, narrow QRS, normal axis, normal intervals, nonspecific but no concerning ST changes.  CXR revealed bilateral pulmonary nodules but no acute change.  She was discharged uneventfully.  Symptomatically, she denies any complaint.  Sometimes she has a little indigestion with certain foods.  She denies any chest pain or shortness of breath.  She denies any bone pain.  She denies any nausea or vomiting.  She denies any change in left lower extremity edema.   Past Medical History  Diagnosis Date  . Breast lump 2006     both breast   . Chest wall mass   . Cancer (HCC)   . Uterine cancer [facility])     Past Surgical History  Procedure Laterality Date  . Cholecystectomy  2001  . Ankle surgery  2005  . Cesarean section  1993  . Back surgery  2005  . Breast biopsy Bilateral   . Colonoscopy    . Abdominal hysterectomy  01/30/15   Family History  Problem Relation Age of Onset  . Breast cancer Sister 96  . Breast cancer Maternal Aunt   . Breast cancer Maternal Grandmother 60  . Stomach cancer Maternal Aunt   . Lung cancer Maternal Grandfather   . Colon polyps Mother   . Cancer - Colon Cousin 3    first cousin  . Lupus Father   . Esophageal cancer Maternal Uncle     Social History:  reports that she has never smoked. She has never used smokeless tobacco. She reports that she does not drink alcohol or use illicit drugs.   The patient is  accompanied by her niece, Darlina Rumpf, today.  Allergies:  Allergies  Allergen Reactions  . Pravastatin Hives and Nausea And Vomiting  . Sulfa Antibiotics Hives and Swelling  . Latex Rash  . Other Rash and Other (See Comments)    Pt states that she is allergic to spandex.   Current Medications: Current Outpatient Prescriptions  Medication Sig Dispense Refill  . lidocaine-prilocaine (EMLA) cream Apply 1 application topically as needed. Apply one hour prior to chemotherapy (Patient taking differently: Apply 1 application topically as needed (prior to accessing pts port). Pt applies one hour prior to chemotherapy.) 30 g 1  . Multiple Vitamins-Minerals (ALIVE WOMENS ENERGY) TABS Take 1 tablet by mouth daily.     . ondansetron (ZOFRAN) 8 MG tablet Take 8 mg by mouth every 8 (eight) hours as needed for nausea or vomiting. Reported on 06/26/2016    . polyethylene glycol powder (GLYCOLAX/MIRALAX) powder Take 1 Container by mouth daily as needed. Reported on 06/26/2016    . XARELTO 20 MG TABS tablet Take 1 tablet by mouth  daily with supper 90 tablet 1  . loratadine (CLARITIN) 10 MG tablet Take 10 mg by mouth daily. Reported on 07/02/2016     No current facility-administered medications for this visit.   Facility-Administered Medications Ordered in Other Visits  Medication Dose Route Frequency Provider  Last Rate Last Dose  . 0.9 %  sodium chloride infusion   Intravenous Continuous Lequita Asal, MD   Stopped at 05/01/16 1340  . 0.9 %  sodium chloride infusion   Intravenous Continuous Lequita Asal, MD   Stopped at 06/05/16 1235  . heparin lock flush 100 unit/mL  500 Units Intracatheter Once PRN Lequita Asal, MD   500 Units at 04/03/16 1615  . heparin lock flush 100 unit/mL  500 Units Intracatheter Once PRN Lequita Asal, MD   500 Units at 05/01/16 1306  . heparin lock flush 100 unit/mL  500 Units Intracatheter Once PRN Lequita Asal, MD      . sodium chloride 0.9 % injection 10  mL  10 mL Intracatheter PRN Lequita Asal, MD      . sodium chloride flush (NS) 0.9 % injection 10 mL  10 mL Intracatheter PRN Lequita Asal, MD   10 mL at 04/03/16 1241  . sodium chloride flush (NS) 0.9 % injection 10 mL  10 mL Intracatheter PRN Lequita Asal, MD   10 mL at 05/01/16 1225  . sodium chloride flush (NS) 0.9 % injection 10 mL  10 mL Intracatheter PRN Lequita Asal, MD   10 mL at 06/05/16 1135    Review of Systems:  GENERAL:  Feels good.  No fevers or sweats. Weight down 4 pounds. PERFORMANCE STATUS (ECOG):  2 HEENT:  No visual changes,sore throat, mouth sores or tenderness. Lungs: Shortness of breath with exertion (stable).  No cough.  No hemoptysis. Cardiac:  No chest pain, palpitations, orthopnea, or PND. GI:  Reflux/indigestion.  No nausea, vomiting, constipation, melena, or hematochezia. GU:  No urgency, frequency, dysuria, or hematuria. Musculoskeletal:  No bone or back pain.  No joint pain.  No muscle tenderness. Extremities: Left lower extremity swelling, stable. Skin:  No rashes or skin changes. Neuro:  No headache, numbness or weakness, balance or coordination issues. Endocrine:  No diabetes, thyroid issues, hot flashes or night sweats. Psych:  No mood changes, depression or anxiety. Pain:  No focal pain. Review of systems:  All other systems reviewed and found to be negative.                        Physical Exam: Blood pressure 132/88, pulse 94, temperature 97.2 F (36.2 C), temperature source Tympanic, weight 160 lb 13.2 oz (72.95 kg).  GENERAL:  Well developed, well nourished, woman sitting comfortably in the exam room in no acute distress. MENTAL STATUS:  Alert and oriented to person, place and time. HEAD:  Curly gray hair.  Normocephalic, atraumatic, face symmetric, no Cushingoid features. EYES:  Glasses.  Brown eyes.  Pupils equal round and reactive to light and accomodation.  No conjunctivitis or scleral icterus. ENT:  Oropharynx clear  without lesion.  Tongue normal. Mucous membranes moist.  RESPIRATORY:  Clear to auscultation without rales, wheezes or rhonchi. CARDIOVASCULAR:  Regular rate and rhythm without murmur, rub or gallop. No JVD. ABDOMEN:  Soft, non-tender, with active bowel sounds, and no hepatosplenomegaly.  No masses. SKIN:  No rashes, ulcers or lesions. EXTREMITIES:  1+ left lower extremity edema below the knee (stable). LYMPH NODES: No palpable cervical, supraclavicular, axillary or inguinal adenopathy  NEUROLOGICAL: Appropriate. PSYCH:  Appropriate.  Laboratory testing: [facility] labs on 02/28/2016 included a hematocrit 36.1, hemoglobin 12.1, platelets 206,000, white count 13,200 with an [place] of 11,400.  Comprehensive metabolic panel included a creatinine of 0.67. Liver  function tests included an alkaline phosphatase of 122 (39-117).    [facility] labs on 03/12/2016 included a comprehensive metabolic panel included a creatinine of 0.57.  Liver function tests included an alkaline phosphatase of 118 (39-117) post Neupogen.  Phosphorus was 3.4.  Creatinine kinase was 156 (24-173).  [facility] labs on 04/02/2016 included hematocrit of 33.3, hemoglobin 11.0, platelets 229,000, WBC 2700 with an ANC of 1400.  Comprehensive metabolic panel included a creatinine of 0.61.  Liver function tests included an alkaline phosphatase of 129 (39-117), AST 54 (0-40) and ALT 76 (0-32).  Phosphorus was 3.9.  Creatinine kinase was 120 (24-173).  [facility] labs on 04/23/2016 included hematocrit of 30.4, hemoglobin 10.0, platelets 266,000, WBC 3200 with an ANC of 1600.  Comprehensive metabolic panel included a creatinine of 0.68.  Liver function tests included an alkaline phosphatase of 195 (39-117), AST 45 (0-40) and ALT 69 (0-32).  Phosphorus was 3.2.  Creatinine kinase was 258 (24-173).  [facility] labs on 04/30/2016 included hematocrit of 32.0, hemoglobin 10.8, platelets 287,000, WBC 2100 with an ANC of 900.  Comprehensive metabolic panel included  a creatinine of 0.68.  Liver function tests included an alkaline phosphatase of 166 (39-117), AST 42 (0-40) and ALT 44 (0-32).  Phosphorus was 4.0.  Creatinine kinase was 182 (24-173).  [facility] labs on 05/21/2016 included hematocrit of 28.9, hemoglobin 9.4, platelets 167,000, WBC 2500 with an ANC of 1,100.  Comprehensive metabolic panel included a creatinine of 0.68.  Liver function tests included an alkaline phosphatase of 244 (39-117), AST 63 (0-40) and ALT 86 (0-32).  Phosphorus was 4.0.  Creatinine kinase was 433 (24-173).  [facility] labs on 06/04/2016 included hematocrit of 32.7, hemoglobin 10.6, platelets 287,000, WBC 2800 with an ANC of 1,300. Comprehensive metabolic panel included a creatinine of 0.68. Liver function tests included an alkaline phosphatase of 241 (39-117), AST 64 (0-40) and ALT 67 (0-32). Phosphorus was 4.0. Creatinine kinase was 229 (24-173).  [facility] labs on 06/25/2016 included hematocrit of 28.1, hemoglobin 9.3, platelets 155,000, WBC 2500 with an ANC of 1,000.  Comprehensive metabolic panel included a creatinine of 0.62.  Liver function tests included an alkaline phosphatase of 240 (39-117), AST 72 (0-40) and ALT 80 (0-32).  Phosphorus was 3.6.  Creatinine kinase was 8.6 (0-5.3).   Infusion on 07/02/2016  Component Date Value Ref Range Status  . WBC 07/02/2016 2.3* 3.6 - 11.0 K/uL Final  . RBC 07/02/2016 2.84* 3.80 - 5.20 MIL/uL Final  . Hemoglobin 07/02/2016 9.8* 12.0 - 16.0 g/dL Final  . HCT 07/02/2016 29.0* 35.0 - 47.0 % Final  . MCV 07/02/2016 102.1* 80.0 - 100.0 fL Final  . MCH 07/02/2016 34.4* 26.0 - 34.0 pg Final  . MCHC 07/02/2016 33.7  32.0 - 36.0 g/dL Final  . RDW 07/02/2016 17.5* 11.5 - 14.5 % Final  . Platelets 07/02/2016 213  150 - 440 K/uL Final  . Neutrophils Relative % 07/02/2016 47%   Final  . Neutro Abs 07/02/2016 1.1* 1.4 - 6.5 K/uL Final  . Lymphocytes Relative 07/02/2016 33%   Final  . Lymphs Abs 07/02/2016 0.8* 1.0 - 3.6 K/uL Final  .  Monocytes Relative 07/02/2016 18%   Final  . Monocytes Absolute 07/02/2016 0.4  0.2 - 0.9 K/uL Final  . Eosinophils Relative 07/02/2016 1%   Final  . Eosinophils Absolute 07/02/2016 0.0  0 - 0.7 K/uL Final  . Basophils Relative 07/02/2016 1%   Final  . Basophils Absolute 07/02/2016 0.0  0 - 0.1 K/uL Final  . Sodium  07/02/2016 139  135 - 145 mmol/L Final  . Potassium 07/02/2016 3.7  3.5 - 5.1 mmol/L Final  . Chloride 07/02/2016 107  101 - 111 mmol/L Final  . CO2 07/02/2016 27  22 - 32 mmol/L Final  . Glucose, Bld 07/02/2016 98  65 - 99 mg/dL Final  . BUN 07/02/2016 11  6 - 20 mg/dL Final  . Creatinine, Ser 07/02/2016 0.62  0.44 - 1.00 mg/dL Final  . Calcium 07/02/2016 9.3  8.9 - 10.3 mg/dL Final  . Total Protein 07/02/2016 7.3  6.5 - 8.1 g/dL Final  . Albumin 07/02/2016 4.0  3.5 - 5.0 g/dL Final  . AST 07/02/2016 54* 15 - 41 U/L Final  . ALT 07/02/2016 61* 14 - 54 U/L Final  . Alkaline Phosphatase 07/02/2016 219* 38 - 126 U/L Final  . Total Bilirubin 07/02/2016 0.5  0.3 - 1.2 mg/dL Final  . GFR calc non Af Amer 07/02/2016 >60  >60 mL/min Final  . GFR calc Af Amer 07/02/2016 >60  >60 mL/min Final   Comment: (NOTE) The eGFR has been calculated using the CKD EPI equation. This calculation has not been validated in all clinical situations. eGFR's persistently <60 mL/min signify possible Chronic Kidney Disease.   . Anion gap 07/02/2016 5  5 - 15 Final  . Total CK 07/02/2016 251* 38 - 234 U/L Final  . Phosphorus 07/02/2016 3.7  2.5 - 4.6 mg/dL Final   Assessment:  ANNJANETTE WERTENBERGER is a 55 y.o. female 55 y.o. female with stage IV uterine leiomyosarcoma. She underwent TAH/BSO on 01/30/2015. Pathology confirmed high grade leiomyosarcoma with 2 large adjacent tumor nodules (10 cm and 5 cm) and a separate 0.5 cm serosal nodule.   Chest, abdomen, and pelvic CT scan on 02/12/2015 revealed a 1.7 x 2.3 cm right external iliac node. Pulmonary nodules were scattered throughout the lungs  bilaterally. There was left internal mammary chain adenopathy measuring up to 3.6 x 2 cm.  She underwent anterior chest wall pleural-based CT guided biopsy on 02/21/2015. Pathology was consistent with metastatic leiomyosarcoma. Bone scan on 02/28/2015 revealed no evidence of metastatic disease with indeterminate uptake in the left posterior rib.   She has a fraternal twin who developed breast cancer at age 81. Several other family members have malignancies. 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  Prechemotherapy labs from [facility] revealed leukopenia (WBC 2300 with ANC 900). Work-up to date is negative. She has a family history of leukopenia. Bone marrow on 03/08/2015 revealed no evidence of neoplasia or metastatic disease. Marrow was normocellular for age (30-50%) with trilineage hematopoiesis. There was no increase in marrow reticulin fibers. Flow cytometry and cytogenetics were normal.  She received 4 cycles of gemcitabine and Taxotere (03/20/2015 - 05/22/2015) with Neulasta support. She received gemcitabine alone on 05/22/2015.  She was admitted on 05/24/2015 with an acute febrile illness. All cultures were negative.   Chest CT on 05/25/2015 revealed early progressive disease. Abdominal and pelvic CT scan on 05/30/2015 revealed stable to slightly increased right external iliac lymph node (1.8 x 2.5 cm).   Echocardiogram on 06/18/2015 revealed EF of 60-65%.  Echo on 10/22/2015 revealed an EF of 65%.  MUGA on 12/25/2015 revealed an EF of 52%.  Echo on 03/03/2016 revealed an EF of 50%.  Echo on 05/21/2016 revealed an EF of 55%.  Bilateral lower extremity duplex on 05/30/2015 was negative.  Left upper extremity duplex on 06/08/2015 was negative.  Left lower extremity duplex  on 07/19/2015 documented a DVT.   Left lower extremity duplex on 01/31/2016 revealed re-cannulization of the prior DVT involving the left  popliteal vein.  She is on Xarelto.   She received 8 cycles of adriamycin (06/07/2015 - 12/06/2015) with Neulasta (On-Pro) support.  Cycle #3 was complicated by an acute  left lower extremity DVT.  Cycle #6 was postponed a week secondary to neutropenia ([place] 700).  Zinecard was added with cycle #7.  Olaratumab [name] was added with cycle #8.  Chest, abdomen, and pelvic CT scan on 08/07/2015 revealed a partial treatment response with decrease anterior left upper pleural metastasis, stable to decreased pulmonary metastases and decreased right external iliac adenopathy. There were no new sites of disease.  Chest, abdomen, and pelvic CT scan on 10/22/2015 revealed mild decrease in left anterior chest wall mass/internal mammary lymphadenopathy (3.1 x 4.6 cm to 2.5 x 4.4 cm).  There was decreased mild right external iliac lymphadenopathy (1.4 cm to 1.2 cm).  There was diffuse stable bilateral pulmonary metastases (largest 1.1 cm in anterior right lung).  There was no new or progressive metastatic disease.  Chest CT on 12/25/2015 on revealed slight progression in pulmonary nodules (index nodules: 5 mm to 11 mm and 8 mm to 11 mm).  The dominant soft tissue lesion along the left hemi-thorax was 2.2 x 4.3 cm (stable).  She received 3 cycles of single agent olaratumab [name] (12/27/2015 -02/08/2016).  She required GCSF secondary to neutropenia.  PET scan on 02/26/2016 revealed progression of numerous pulmonary nodules/metastasis and mild progression (1.2 cm with SUV 7) of right pelvic nodal metastasis.  In the right lung apex, there was a 1.3 x 1.0 cm (SUV 6.2) previously 0.9 x 0.6 cm.  In the anterior left lung was a 2.0 x 2.2 cm (SUV 12.4) previously 1.1 x 0.9 cm.  In the anterior right middle lobe was a 1.1 x 1.1 cm (SUV 2.9), previously 0.9 x 0.9 cm.  The subpleural mass along the anterior of the left upper lobe was 2.1 x 3.5 cm (not FDG avid), previously 2.2 x 3.4 cm.  Bone scan on 03/05/2016 revealed  no evidence of metastatic disease.  She is s/p 4 cycles of trabectedin (Yondelis) (03/13/2016 - 05/01/2016; 06/05/2016) with Neulasta support.  She had a grade I liver toxicity secondary to Yondelis. Chest CT on 04/28/2016 revealed innumerable pulmonary metastases which had slightly increased (46m) in size since 02/26/2016.  Symptomatically, she feels good.  ANC is 1,100.  AST is 1.31 x ULN.  Alkaline phosphatase is 1.7 x ULN.  CPK is 251 (1.07 x ULN).  Plan: 1.  Review today's labs.  Discuss proceeding with next cycle of chemotherapy.  Discuss checking interval counts. 2.  Cycle #5 Yondelis today. 3.  [place] 4.  Forms for [facility]. 5.  Schedule echo on 07/21/2016. 6.  Continue Xarelto. 7.  [place], review of labs and echo, and cycle #6 Yondelis   MLequita Asal MD 07/02/2016, 11:53 AM   Addendum: Prior to each cycle of Yondelis, AEast [place]ould be >= 1000 (patient has chronic neutropenia), platelets >= 100,000, bilirubin <= ULN, and alkaline phosphatase, ALT, AST, and CPK <= 2.5 ULN).   During treatment, AST or ALT >5 times ULN during prior cycle: Delay dose for up to 3 weeks and reduce the next dose by one dose level Alkaline phosphatase >2.5 times ULN: Delay dose for up to 3 weeks and reduce the next  dose by one dose level. Creatine phosphokinase >5 times ULN during prior cycle: Delay dose for up to 3 weeks and reduce the next dose by one dose level  First dose reduction: 1.2 mg/m2 once every 3 weeks. Second dose reduction: 1 mg/m2 once every 3 weeks

## 2016-07-02 NOTE — Progress Notes (Signed)
Patient ambulates without assistance, brought to exam room 4.  Patient denies pain or discomfort at this time.  BP 132/88 in right arm sitting, HR 94, vital documented.  Medication record updated,information provided by patient.  Dr. Ree Kida.

## 2016-07-03 ENCOUNTER — Other Ambulatory Visit: Payer: Self-pay | Admitting: Hematology and Oncology

## 2016-07-03 ENCOUNTER — Inpatient Hospital Stay: Payer: 59

## 2016-07-03 VITALS — BP 90/50 | HR 106

## 2016-07-03 DIAGNOSIS — C55 Malignant neoplasm of uterus, part unspecified: Secondary | ICD-10-CM

## 2016-07-03 MED ORDER — SODIUM CHLORIDE 0.9% FLUSH
10.0000 mL | INTRAVENOUS | Status: DC | PRN
  Administered 2016-07-03: 10 mL
  Filled 2016-07-03: qty 10

## 2016-07-03 MED ORDER — PEGFILGRASTIM 6 MG/0.6ML ~~LOC~~ PSKT
6.0000 mg | PREFILLED_SYRINGE | Freq: Once | SUBCUTANEOUS | Status: AC
  Administered 2016-07-03: 6 mg via SUBCUTANEOUS
  Filled 2016-07-03: qty 0.6

## 2016-07-03 MED ORDER — HEPARIN SOD (PORK) LOCK FLUSH 100 UNIT/ML IV SOLN
500.0000 [IU] | Freq: Once | INTRAVENOUS | Status: AC | PRN
  Administered 2016-07-03: 500 [IU]
  Filled 2016-07-03: qty 5

## 2016-07-09 ENCOUNTER — Ambulatory Visit: Payer: 59 | Admitting: Hematology and Oncology

## 2016-07-15 ENCOUNTER — Encounter: Payer: Self-pay | Admitting: Hematology and Oncology

## 2016-07-18 ENCOUNTER — Encounter: Payer: Self-pay | Admitting: Hematology and Oncology

## 2016-07-18 ENCOUNTER — Telehealth: Payer: Self-pay | Admitting: *Deleted

## 2016-07-18 NOTE — Telephone Encounter (Signed)
called pt to inform her that her labs from [place] showed ck 1988. She would like her labs to be repeated today and if it stays elevated she will probably need to reduce dose of chemo. She wants pt to encourage fluids and pt states she will and she will come by and get form. Pt did say that she has been having thigh pain when she gets up and moves around and then when she sits and rests it goes away.

## 2016-07-21 ENCOUNTER — Ambulatory Visit (HOSPITAL_BASED_OUTPATIENT_CLINIC_OR_DEPARTMENT_OTHER)
Admission: RE | Admit: 2016-07-21 | Discharge: 2016-07-21 | Disposition: A | Discharge status: Home / Self Care | Payer: 59 | Source: Ambulatory Visit | Attending: Hematology and Oncology | Admitting: Hematology and Oncology

## 2016-07-21 ENCOUNTER — Encounter: Payer: Self-pay | Admitting: Internal Medicine

## 2016-07-21 ENCOUNTER — Observation Stay
Admission: AD | Admit: 2016-07-21 | Discharge: 2016-07-23 | Disposition: A | Discharge status: Home / Self Care | Payer: 59 | Source: Ambulatory Visit | Attending: Internal Medicine | Admitting: Internal Medicine

## 2016-07-21 ENCOUNTER — Inpatient Hospital Stay: Admission: AD | Admit: 2016-07-21 | Payer: 59 | Source: Ambulatory Visit | Admitting: Internal Medicine

## 2016-07-21 DIAGNOSIS — Z91048 Other nonmedicinal substance allergy status: Secondary | ICD-10-CM | POA: Insufficient documentation

## 2016-07-21 DIAGNOSIS — Z7901 Long term (current) use of anticoagulants: Secondary | ICD-10-CM

## 2016-07-21 DIAGNOSIS — D61818 Other pancytopenia: Secondary | ICD-10-CM | POA: Diagnosis not present

## 2016-07-21 DIAGNOSIS — Z86718 Personal history of other venous thrombosis and embolism: Secondary | ICD-10-CM

## 2016-07-21 DIAGNOSIS — C7800 Secondary malignant neoplasm of unspecified lung: Secondary | ICD-10-CM | POA: Diagnosis not present

## 2016-07-21 DIAGNOSIS — C775 Secondary and unspecified malignant neoplasm of intrapelvic lymph nodes: Secondary | ICD-10-CM

## 2016-07-21 DIAGNOSIS — C55 Malignant neoplasm of uterus, part unspecified: Secondary | ICD-10-CM

## 2016-07-21 DIAGNOSIS — Z882 Allergy status to sulfonamides status: Secondary | ICD-10-CM | POA: Insufficient documentation

## 2016-07-21 DIAGNOSIS — R7989 Other specified abnormal findings of blood chemistry: Secondary | ICD-10-CM

## 2016-07-21 DIAGNOSIS — Z888 Allergy status to other drugs, medicaments and biological substances status: Secondary | ICD-10-CM | POA: Insufficient documentation

## 2016-07-21 DIAGNOSIS — D638 Anemia in other chronic diseases classified elsewhere: Secondary | ICD-10-CM | POA: Insufficient documentation

## 2016-07-21 DIAGNOSIS — M6282 Rhabdomyolysis: Principal | ICD-10-CM

## 2016-07-21 DIAGNOSIS — Z91040 Latex allergy status: Secondary | ICD-10-CM | POA: Diagnosis not present

## 2016-07-21 DIAGNOSIS — T451X5S Adverse effect of antineoplastic and immunosuppressive drugs, sequela: Secondary | ICD-10-CM

## 2016-07-21 DIAGNOSIS — R000 Tachycardia, unspecified: Secondary | ICD-10-CM | POA: Insufficient documentation

## 2016-07-21 DIAGNOSIS — Z79899 Other long term (current) drug therapy: Secondary | ICD-10-CM | POA: Diagnosis not present

## 2016-07-21 DIAGNOSIS — I82409 Acute embolism and thrombosis of unspecified deep veins of unspecified lower extremity: Secondary | ICD-10-CM | POA: Diagnosis present

## 2016-07-21 HISTORY — DX: Acute embolism and thrombosis of unspecified deep veins of unspecified lower extremity: I82.409

## 2016-07-21 LAB — CBC
HEMATOCRIT: 24.7 % — AB (ref 35.0–47.0)
Hemoglobin: 8 g/dL — ABNORMAL LOW (ref 12.0–16.0)
MCH: 33.6 pg (ref 26.0–34.0)
MCHC: 32.5 g/dL (ref 32.0–36.0)
MCV: 103.4 fL — AB (ref 80.0–100.0)
Platelets: 86 10*3/uL — ABNORMAL LOW (ref 150–440)
RBC: 2.39 MIL/uL — ABNORMAL LOW (ref 3.80–5.20)
RDW: 17.1 % — AB (ref 11.5–14.5)
WBC: 3.9 10*3/uL (ref 3.6–11.0)

## 2016-07-21 LAB — APTT: APTT: 29 s (ref 24–36)

## 2016-07-21 LAB — COMPREHENSIVE METABOLIC PANEL
ALBUMIN: 3.6 g/dL (ref 3.5–5.0)
ALT: 200 U/L — AB (ref 14–54)
AST: 150 U/L — AB (ref 15–41)
Alkaline Phosphatase: 231 U/L — ABNORMAL HIGH (ref 38–126)
Anion gap: 7 (ref 5–15)
BUN: 9 mg/dL (ref 6–20)
CHLORIDE: 107 mmol/L (ref 101–111)
CO2: 25 mmol/L (ref 22–32)
Calcium: 9.1 mg/dL (ref 8.9–10.3)
Creatinine, Ser: 0.59 mg/dL (ref 0.44–1.00)
GFR calc Af Amer: >60 mL/min (ref >60)
GFR calc non Af Amer: >60 mL/min (ref >60)
GLUCOSE: 103 mg/dL — AB (ref 65–99)
POTASSIUM: 3.3 mmol/L — AB (ref 3.5–5.1)
Sodium: 139 mmol/L (ref 135–145)
Total Bilirubin: 0.9 mg/dL (ref 0.3–1.2)
Total Protein: 6.7 g/dL (ref 6.5–8.1)

## 2016-07-21 LAB — PROTIME-INR
INR: 1.11
Prothrombin Time: 14.5 seconds (ref 11.4–15.0)

## 2016-07-21 LAB — CK: Total CK: 2549 U/L — ABNORMAL HIGH (ref 38–234)

## 2016-07-21 MED ORDER — ONDANSETRON HCL 4 MG PO TABS: 4.0000 mg | ORAL_TABLET | Freq: Four times a day (QID) | ORAL | Status: DC | PRN

## 2016-07-21 MED ORDER — LORATADINE 10 MG PO TABS
10.0000 mg | ORAL_TABLET | Freq: Every day | ORAL | Status: DC
  Filled 2016-07-21: qty 1

## 2016-07-21 MED ORDER — POLYETHYLENE GLYCOL 3350 17 GM/SCOOP PO POWD
1.0000 | Freq: Every day | ORAL | Status: DC | PRN
  Filled 2016-07-21: qty 255

## 2016-07-21 MED ORDER — ALIVE WOMENS ENERGY PO TABS: 1.0000 | ORAL_TABLET | Freq: Every day | ORAL | Status: DC

## 2016-07-21 MED ORDER — ACETAMINOPHEN 325 MG PO TABS: 650.0000 mg | ORAL_TABLET | Freq: Four times a day (QID) | ORAL | Status: DC | PRN

## 2016-07-21 MED ORDER — SODIUM CHLORIDE 0.9 % IV SOLN
INTRAVENOUS | Status: DC
  Administered 2016-07-21 – 2016-07-23 (×5): via INTRAVENOUS

## 2016-07-21 MED ORDER — ACETAMINOPHEN 650 MG RE SUPP: 650.0000 mg | Freq: Four times a day (QID) | RECTAL | Status: DC | PRN

## 2016-07-21 MED ORDER — RIVAROXABAN 20 MG PO TABS
20.0000 mg | ORAL_TABLET | Freq: Every day | ORAL | Status: DC
  Administered 2016-07-21 – 2016-07-22 (×2): 20 mg via ORAL
  Filled 2016-07-21 (×2): qty 1

## 2016-07-21 MED ORDER — ONDANSETRON HCL 4 MG/2ML IJ SOLN: 4.0000 mg | Freq: Four times a day (QID) | INTRAMUSCULAR | Status: DC | PRN

## 2016-07-21 MED ORDER — ADULT MULTIVITAMIN W/MINERALS CH
1.0000 | ORAL_TABLET | Freq: Every day | ORAL | Status: DC
  Administered 2016-07-21 – 2016-07-23 (×3): 1 via ORAL
  Filled 2016-07-21 (×3): qty 1

## 2016-07-21 NOTE — Progress Notes (Signed)
Physical Therapy Evaluation Patient Details Name: Cassidy Bradley MRN: [id number] DOB: 07/02/1961 Today's Date: 07/21/2016   History of Present Illness  Pt is a 55 y/o female who was admitted for Rhabdomyolysis. PMH includes stage IV uterine CA and L LE DVT.  Clinical Impression  Pt is a pleasant and motivated 55 y/o female who presents with weakness and bilateral thigh pain. PLOF: Pt was independent with all community ambulation and independent with ADLs. Pt reports bilateral thigh pain began 3 weeks ago after a round of chemotherapy and has no previous history of LE pain. Pt states pain is worst in the AM with stiffness upon waking; unable to bend down to pick object off of floor due to stiffness. Pt also reports increase in pain with sit/stand transfers. Pt is independent with all bed mobility, transfers, and ambulation. Pt demonstrates good standing balance with feet apart (EO and EC), and feet together (EC) for 30 seconds each. Modified DGI:10/12 indicating pt is not a fall risk. At this time no acute needs. Outpatient ortho/sports is appropriate to address deconditioning and frontal thigh pain.     Follow Up Recommendations Outpatient PT    Equipment Recommendations       Recommendations for Other Services       Precautions / Restrictions Restrictions Weight Bearing Restrictions: No      Mobility  Bed Mobility Overal bed mobility: Independent             General bed mobility comments: Pt independent with all bed mobility and demonstrates safe technique  Transfers Overall transfer level: Independent Equipment used: None             General transfer comment: Pt able to complete sit/stand transfer independently and with safe technique. Pt reports B quad pain with sit/stand.  Ambulation/Gait Ambulation/Gait assistance: Independent Ambulation Distance (Feet): 320 Feet Assistive device: None Gait Pattern/deviations: WFL(Within Functional Limits)   Gait velocity  interpretation: at or above normal speed for age/gender General Gait Details: Pt demonstrates a step-through gait pattern with no use of AD.   Stairs            Wheelchair Mobility    Modified Rankin (Stroke Patients Only)       Balance Overall balance assessment: Independent                                           Pertinent Vitals/Pain Pain Assessment: 0-10 Pain Score: 2  Pain Location: Bilateral Thighs Pain Descriptors / Indicators: Cramping (muscle cramping") Pain Intervention(s): Monitored during session    Home Living Family/patient expects to be discharged to:: Private residence Living Arrangements: Spouse/significant other;Children (Husband and 63 y/o son.) Available Help at Discharge: Family Type of Home: House Home Access: Level entry     Home Layout: Two level Home Equipment: None Additional Comments: Pt reports main bedroom is on 1st floor. Pt is able to ascend stairs to second floor but tries to avoid it if not needed,    Prior Function Level of Independence: Independent         Comments: Pt was independent with all household and community ambulation. Pt independent with ADLs/     Hand Dominance        Extremity/Trunk Assessment   Upper Extremity Assessment: Overall WFL for tasks assessed           Lower Extremity Assessment: Overall WFL for tasks  assessed (Pt able to WB on B LE with no difficulty.)         Communication   Communication: No difficulties  Cognition Arousal/Alertness: Awake/alert Behavior During Therapy: WFL for tasks assessed/performed Overall Cognitive Status: Within Functional Limits for tasks assessed                      General Comments      Exercises        Assessment/Plan    PT Assessment Patent does not need any further PT services  PT Diagnosis Generalized weakness;Acute pain   PT Problem List Decreased strength;Decreased activity tolerance;Pain  PT Treatment  Interventions     PT Goals (Current goals can be found in the Care Plan section) Acute Rehab PT Goals Patient Stated Goal: To return home PT Goal Formulation: With patient Time For Goal Achievement: 08/04/16 Potential to Achieve Goals: Good    Frequency     Barriers to discharge        Co-evaluation               End of Session Equipment Utilized During Treatment: Gait belt Activity Tolerance: Patient tolerated treatment well Patient left: in bed;with call bell/phone within reach (Pt not a fall risk) Nurse Communication: Mobility status         Time: [id number] PT Time Calculation (min) (ACUTE ONLY): 14 min   Charges:         PT G Codes:        Cassidy Bradley 08-14-2016, 5:52 PM Cassidy Bradley, SPT [phone]

## 2016-07-21 NOTE — H&P (Signed)
[facility] [place] NAME: Cassidy Bradley    MR#:  [id number]  DATE OF BIRTH:  [birth year]  DATE OF ADMISSION:  07/21/2016  PRIMARY CARE PHYSICIAN: Maryland Pink, MD   REQUESTING/REFERRING PHYSICIAN: Dr. Nolon Stalls  CHIEF COMPLAINT:  No chief complaint on file.   HISTORY OF PRESENT ILLNESS:  Cassidy Bradley  is a 55 y.o. 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 Patient has been started on new chemotherapy with Yondelis in Feb 2017, she tolerated first three cycles without significant side effects. She started having weakness and leg aches last month and was sent to ER for same symptoms- CPK was elevated and improved with fluids. Last dose chemo was 3 weeks ago. Started having worsening pain in both thighs, weakness. CPK as outpatient was elevated- so was advised to get admitted. Denies any other complaints.   PAST MEDICAL HISTORY:   Past Medical History:  Diagnosis Date  . Breast lump 2006    both breast   . Cancer ([place])    Uterine Leiomyosarcoma  . Chest wall mass   . DVT (deep venous thrombosis) (HCC)    Left leg  . Uterine cancer ([place])     PAST SURGICAL HISTORY:   Past Surgical History:  Procedure Laterality Date  . ABDOMINAL HYSTERECTOMY  01/30/15  . ANKLE SURGERY  2005  . BACK SURGERY  2005  . BREAST BIOPSY Bilateral   . CESAREAN SECTION  1993  . CHOLECYSTECTOMY  2001  . COLONOSCOPY      SOCIAL HISTORY:   Social History  Substance Use Topics  . Smoking status: Never Smoker  . Smokeless tobacco: Never Used  . Alcohol use No    FAMILY HISTORY:   Family History  Problem Relation Age of Onset  . Breast cancer Sister 74  . Breast cancer Maternal Aunt   . Breast cancer Maternal Grandmother 60  . Stomach cancer Maternal Aunt   . Lung cancer Maternal  Grandfather   . Colon polyps Mother   . Cancer - Colon Cousin 95    first cousin  . Lupus Father   . Esophageal cancer Maternal Uncle     DRUG ALLERGIES:   Allergies  Allergen Reactions  . Pravastatin Hives and Nausea And Vomiting  . Sulfa Antibiotics Hives and Swelling  . Latex Rash  . Other Rash and Other (See Comments)    Pt states that she is allergic to spandex.    REVIEW OF SYSTEMS:   Review of Systems  Constitutional: Negative for chills, fever, malaise/fatigue and weight loss.  HENT: Negative for ear discharge, ear pain, hearing loss, nosebleeds and tinnitus.   Eyes: Negative for blurred vision, double vision and photophobia.  Respiratory: Negative for cough, hemoptysis, shortness of breath and wheezing.   Cardiovascular: Negative for chest pain, palpitations, orthopnea and leg swelling.  Gastrointestinal: Negative for abdominal pain, constipation, diarrhea, heartburn, melena, nausea and vomiting.  Genitourinary: Negative for dysuria, frequency, hematuria and urgency.  Musculoskeletal: Positive for myalgias. Negative for back pain and neck pain.  Skin: Negative for rash.  Neurological: Positive for dizziness and weakness. Negative for tingling, tremors, sensory change, speech change, focal weakness and headaches.  Endo/Heme/Allergies: Does not bruise/bleed easily.  Psychiatric/Behavioral: Negative for depression.    MEDICATIONS AT HOME:   Prior to Admission medications   Medication Sig Start Date  End Date Taking? Authorizing Provider  lidocaine-prilocaine (EMLA) cream Apply 1 application topically as needed. Apply one hour prior to chemotherapy Patient taking differently: Apply 1 application topically as needed (prior to accessing pts port). Pt applies one hour prior to chemotherapy. 05/01/15   Lequita Asal, MD  loratadine (CLARITIN) 10 MG tablet Take 10 mg by mouth daily. Reported on 07/02/2016    Historical Provider, MD  Multiple Vitamins-Minerals (ALIVE WOMENS  ENERGY) TABS Take 1 tablet by mouth daily.     Historical Provider, MD  ondansetron (ZOFRAN) 8 MG tablet Take 8 mg by mouth every 8 (eight) hours as needed for nausea or vomiting. Reported on 06/26/2016    Historical Provider, MD  polyethylene glycol powder (GLYCOLAX/MIRALAX) powder Take 1 Container by mouth daily as needed. Reported on 06/26/2016    Historical Provider, MD  XARELTO 20 MG TABS tablet Take 1 tablet by mouth  daily with supper 06/12/16   Lequita Asal, MD      VITAL SIGNS:  Height 5\' 4"  (1.626 m), weight 72.8 kg (160 lb 6.4 oz).  PHYSICAL EXAMINATION:   Physical Exam  GENERAL:  55 y.o.-year-old very pleasant patient lying in the bed with no acute distress.  EYES: Pupils equal, round, reactive to light and accommodation. No scleral icterus. Extraocular muscles intact.  HEENT: Head atraumatic, normocephalic. Oropharynx and nasopharynx clear.  NECK:  Supple, no jugular venous distention. No thyroid enlargement, no tenderness.  LUNGS: Normal breath sounds bilaterally, no wheezing, rales,rhonchi or crepitation. No use of accessory muscles of respiration.  CARDIOVASCULAR: S1, S2 normal. No murmurs, rubs, or gallops.  ABDOMEN: Soft, nontender, nondistended. Bowel sounds present. No organomegaly or mass.  EXTREMITIES: No pedal edema, cyanosis, or clubbing. Minimal tenderness to touch on the quadriceps- but no local abnormalities, swelling or erythema on inspection. NEUROLOGIC: Cranial nerves II through XII are intact. Muscle strength 5/5 in all extremities. Sensation intact. Gait not checked.  PSYCHIATRIC: The patient is alert and oriented x 3.  SKIN: No obvious rash, lesion, or ulcer.   LABORATORY PANEL:   CBC No results for input(s): WBC, HGB, HCT, PLT in the last 168 hours. ------------------------------------------------------------------------------------------------------------------  Chemistries  No results for input(s): NA, K, CL, CO2, GLUCOSE, BUN, CREATININE,  CALCIUM, MG, AST, ALT, ALKPHOS, BILITOT in the last 168 hours.  Invalid input(s): GFRCGP ------------------------------------------------------------------------------------------------------------------  Cardiac Enzymes No results for input(s): TROPONINI in the last 168 hours. ------------------------------------------------------------------------------------------------------------------  RADIOLOGY:  No results found.  EKG:   Orders placed or performed during the hospital encounter of 06/26/16  . ED EKG within 10 minutes  . ED EKG within 10 minutes  . EKG    IMPRESSION AND PLAN:   Cassidy Bradley  is a 55 y.o. 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  #1 Rhabdomyolysis- elevated CPK, recheck in the hospital -Labs are pending. IV fluids -Secondary to chemotherapy. -Physical therapy consult. Admitted under observation  #2 elevated LFTs-labs pending again. Likely secondary to chemotherapy according to oncology notes. -Monitor. Due for chemotherapy in 2 days.  #3 stage IV metastatic uterine Leiomyosarcoma-oncology consulted, with pulmonary nodules and pelvic node and subpleural left lung mass- on chemo  #4 H/o Left leg DVT- continue xarelto  Physical Therapy    All the records are reviewed and case discussed with ED provider. Management plans discussed with the patient, family and they are in agreement.  CODE STATUS:  Full code  TOTAL TIME TAKING CARE OF THIS PATIENT: 50 minutes.    Gladstone Lighter M.D on 07/21/2016 at 3:54 PM  Between 7am to 6pm - Pager - [phone]  After 6pm go to [email or web address] - password [email or web address] [facility]  [place] [facility]  Office  [phone]  CC: Primary care physician; Maryland Pink, MD

## 2016-07-21 NOTE — Progress Notes (Signed)
*  PRELIMINARY RESULTS* Echocardiogram 2D Echocardiogram has been performed.  [name] 07/21/2016, 11:40 AM

## 2016-07-22 DIAGNOSIS — M6282 Rhabdomyolysis: Secondary | ICD-10-CM | POA: Diagnosis not present

## 2016-07-22 LAB — BASIC METABOLIC PANEL
ANION GAP: 6 (ref 5–15)
BUN: 8 mg/dL (ref 6–20)
CALCIUM: 8.9 mg/dL (ref 8.9–10.3)
CO2: 25 mmol/L (ref 22–32)
CREATININE: 0.55 mg/dL (ref 0.44–1.00)
Chloride: 110 mmol/L (ref 101–111)
GFR calc Af Amer: >60 mL/min (ref >60)
GLUCOSE: 94 mg/dL (ref 65–99)
Potassium: 3.6 mmol/L (ref 3.5–5.1)
Sodium: 141 mmol/L (ref 135–145)

## 2016-07-22 LAB — CBC
HCT: 24.2 % — ABNORMAL LOW (ref 35.0–47.0)
HEMOGLOBIN: 8.2 g/dL — AB (ref 12.0–16.0)
MCH: 35.1 pg — AB (ref 26.0–34.0)
MCHC: 33.9 g/dL (ref 32.0–36.0)
MCV: 103.4 fL — ABNORMAL HIGH (ref 80.0–100.0)
PLATELETS: 75 10*3/uL — AB (ref 150–440)
RBC: 2.34 MIL/uL — ABNORMAL LOW (ref 3.80–5.20)
RDW: 17.1 % — AB (ref 11.5–14.5)
WBC: 2.7 10*3/uL — ABNORMAL LOW (ref 3.6–11.0)

## 2016-07-22 LAB — CK: CK TOTAL: 1800 U/L — AB (ref 38–234)

## 2016-07-22 NOTE — Progress Notes (Signed)
Pt states she does not like the hospital food. On regular diet, encouraged patient to have family bring food she likes to eat in. Phillip Heal crackers and peanut butter given at lunch time.

## 2016-07-22 NOTE — Progress Notes (Signed)
[facility] [place] NAME: Cassidy Bradley    MR#:  [id number]  DATE OF BIRTH:  1961-05-13  SUBJECTIVE:  CHIEF COMPLAINT:  Patient is very pleasant and resting comfortably. Muscle cramps are improving. No other complaints  REVIEW OF SYSTEMS:  CONSTITUTIONAL: No fever, fatigue or weakness.  EYES: No blurred or double vision.  EARS, NOSE, AND THROAT: No tinnitus or ear pain.  RESPIRATORY: No cough, shortness of breath, wheezing or hemoptysis.  CARDIOVASCULAR: No chest pain, orthopnea, edema.  GASTROINTESTINAL: No nausea, vomiting, diarrhea or abdominal pain.  GENITOURINARY: No dysuria, hematuria.  ENDOCRINE: No polyuria, nocturia,  HEMATOLOGY: No anemia, easy bruising or bleeding SKIN: No rash or lesion. MUSCULOSKELETAL: No joint pain or arthritis.  Muscle cramps are improving NEUROLOGIC: No tingling, numbness, weakness.  PSYCHIATRY: No anxiety or depression.   DRUG ALLERGIES:   Allergies  Allergen Reactions  . Pravastatin Hives and Nausea And Vomiting  . Sulfa Antibiotics Hives and Swelling  . Latex Rash  . Other Rash and Other (See Comments)    Pt states that she is allergic to spandex.    VITALS:  Blood pressure 116/66, pulse 98, temperature 97.6 F (36.4 C), temperature source Oral, resp. rate 18, height 5\' 4"  (1.626 m), weight 72.8 kg (160 lb 6.4 oz), SpO2 100 %.  PHYSICAL EXAMINATION:  GENERAL:  55 y.o.-year-old patient lying in the bed with no acute distress.  EYES: Pupils equal, round, reactive to light and accommodation. No scleral icterus. Extraocular muscles intact.  HEENT: Head atraumatic, normocephalic. Oropharynx and nasopharynx clear.  NECK:  Supple, no jugular venous distention. No thyroid enlargement, no tenderness.  LUNGS: Normal breath sounds bilaterally, no wheezing, rales,rhonchi or crepitation. No use of accessory muscles of respiration.  CARDIOVASCULAR: S1, S2 normal. No murmurs, rubs, or gallops.   ABDOMEN: Soft, nontender, nondistended. Bowel sounds present. No organomegaly or mass.  EXTREMITIES: No pedal edema, cyanosis, or clubbing.  NEUROLOGIC: Cranial nerves II through XII are intact. Muscle strength 5/5 in all extremities. Sensation intact. Gait not checked.  PSYCHIATRIC: The patient is alert and oriented x 3.  SKIN: No obvious rash, lesion, or ulcer.    LABORATORY PANEL:   CBC  Recent Labs Lab 07/22/16 0500  WBC 2.7*  HGB 8.2*  HCT 24.2*  PLT 75*   ------------------------------------------------------------------------------------------------------------------  Chemistries   Recent Labs Lab 07/21/16 1538 07/22/16 0500  NA 139 141  K 3.3* 3.6  CL 107 110  CO2 25 25  GLUCOSE 103* 94  BUN 9 8  CREATININE 0.59 0.55  CALCIUM 9.1 8.9  AST 150*  --   ALT 200*  --   ALKPHOS 231*  --   BILITOT 0.9  --    ------------------------------------------------------------------------------------------------------------------  Cardiac Enzymes No results for input(s): TROPONINI in the last 168 hours. ------------------------------------------------------------------------------------------------------------------  RADIOLOGY:  No results found.  EKG:   Orders placed or performed during the hospital encounter of 06/26/16  . ED EKG within 10 minutes  . ED EKG within 10 minutes  . EKG    ASSESSMENT AND PLAN:   Cassidy Bradley  is a 55 y.o. 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  #1 Rhabdomyolysis- elevated CPK,2500-1800 Trending down with IV fluids -Continue IV fluids. Repeat CPK in a.m. -Secondary to chemotherapy. -Physical therapy has recommended outpatient physical therapy  #2 elevated LFTs-labs pending again. Likely  secondary to chemotherapy according to oncology notes. -Monitor. Due for  chemotherapy in 2 days.  #3 stage IV metastatic uterine Leiomyosarcoma-oncology consulted, with pulmonary nodules and pelvic node and subpleural left lung mass- on chemo Outpatient follow-up with oncology as recommended recommended outpatient PT  #4 H/o Left leg DVT- continue xarelto  Physical Therapy     All the records are reviewed and case discussed with Care Management/Social Workerr. Management plans discussed with the patient, family and they are in agreement.  CODE STATUS: fc  TOTAL TIME TAKING CARE OF THIS PATIENT: 36 minutes.   POSSIBLE D/C IN  1-2DAYS, DEPENDING ON CLINICAL CONDITION.  Note: This dictation was prepared with Dragon dictation along with smaller phrase technology. Any transcriptional errors that result from this process are unintentional.   Nicholes Mango M.D on 07/22/2016 at 11:59 AM  Between 7am to 6pm - Pager - [phone] After 6pm go to [email or web address] - password [email or web address] [facility]  [place] [facility]  Office  [phone]  CC: Primary care physician; Maryland Pink, MD

## 2016-07-22 NOTE — Consult Note (Signed)
[facility]  Date of admission:  07/21/2016  Inpatient day:  07/21/2016  Consulting physician:  Dr. Gladstone Lighter  Reason for Consultation:  Metastatic uterine leiomyosarcoma with rhabdomyolysis  Chief Complaint: Cassidy Bradley is a 55 y.o. female with metastatic uterine leiomyosarcoma who was with rhabdomyolysis.  HPI:  The patient was diagnosed with stage IV uterine leiomyosarcoma in 02/21/2015. She has received 4 cycles of gemcitabine and Taxotere (03/20/2015 - 05/22/2015), 8 cycles of adriamycin (06/07/2015 - 12/06/2015), and 3 cycles of single agent olaratumab [name] (12/27/2015 -02/08/2016).  PET scan on 02/26/2016 revealed progression of numerous pulmonary nodules/metastasis and mild progression of right pelvic nodal metastasis.   She has received 5 cycles of trabectedin (Yondelis) (03/13/2016 - 07/02/2016) with Neulasta support. She has had mild elevation of her CPK with the prior 4 cycles.  She states that with her last cycle, she began to have an aching sensation in her anterior thighs.  The discomfort has limited her ability to bend.  She has labs drawn through [place] last week revealed a CPK of 1988.  She had thigh pain when she moved around, but went away when sitting and resting.  She was contacted about increasing fluids and repeat CPK.  Labs drawn on 07/18/2016 (availble 07/21/2016) revealed a CPK of 5000.  In addition, she had elevated LFTs (SGOT 247, SGPT 250, and alkaline phosphatase 287).  She was contacted regarding admission.  Symptomatically, she feels that her anterior thigh pain is a little better than last week.  She denies any other complaints.   Past Medical History:  Diagnosis Date  . Breast lump 2006    both breast   . Cancer ([place])    Uterine Leiomyosarcoma  . Chest wall mass   . DVT (deep venous thrombosis) (HCC)    Left leg  . Uterine cancer [facility])     Past Surgical History:  Procedure Laterality Date  .  ABDOMINAL HYSTERECTOMY  01/30/15  . ANKLE SURGERY  2005  . BACK SURGERY  2005  . BREAST BIOPSY Bilateral   . CESAREAN SECTION  1993  . CHOLECYSTECTOMY  2001  . COLONOSCOPY      Family History  Problem Relation Age of Onset  . Breast cancer Sister 19  . Breast cancer Maternal Aunt   . Breast cancer Maternal Grandmother 60  . Stomach cancer Maternal Aunt   . Lung cancer Maternal Grandfather   . Colon polyps Mother   . Cancer - Colon Cousin 42    first cousin  . Lupus Father   . Esophageal cancer Maternal Uncle     Social History:  reports that she has never smoked. She has never used smokeless tobacco. She reports that she does not drink alcohol or use drugs.  The patient is alone today.  Allergies:  Allergies  Allergen Reactions  . Pravastatin Hives and Nausea And Vomiting  . Sulfa Antibiotics Hives and Swelling  . Latex Rash  . Other Rash and Other (See Comments)    Pt states that she is allergic to spandex.    Medications Prior to Admission  Medication Sig Dispense Refill  . XARELTO 20 MG TABS tablet Take 1 tablet by mouth  daily with supper 90 tablet 1  . lidocaine-prilocaine (EMLA) cream Apply 1 application topically as needed. Apply one hour prior to chemotherapy (Patient taking differently: Apply 1 application topically as needed (prior to accessing pts port). Pt applies one hour prior to chemotherapy.) 30 g 1  . loratadine (  CLARITIN) 10 MG tablet Take 10 mg by mouth daily. Reported on 07/02/2016    . Multiple Vitamins-Minerals (ALIVE WOMENS ENERGY) TABS Take 1 tablet by mouth daily.     . ondansetron (ZOFRAN) 8 MG tablet Take 8 mg by mouth every 8 (eight) hours as needed for nausea or vomiting. Reported on 06/26/2016    . polyethylene glycol powder (GLYCOLAX/MIRALAX) powder Take 1 Container by mouth daily as needed. Reported on 06/26/2016      Review of Systems: GENERAL:  Feels good. No fevers, sweats or weight loss. PERFORMANCE STATUS (ECOG):  1 HEENT:  No visual  changes, runny nose, sore throat, mouth sores or tenderness. Lungs:  No sortness of breath or cough.  No hemoptysis. Cardiac:  No chest pain, palpitations, orthopnea, or PND. GI:  Reflux.  No nausea, vomiting, diarrhea, constipation, melena or hematochezia. GU:  No urgency, frequency, dysuria, or hematuria. Musculoskeletal:  Muscle tenderness in anterior thighs.  No back pain.  No joint pain.   Extremities:  Chronic swelling in left lower extremity s/p DVT. Skin:  No rashes or skin changes. Neuro:  No headache, numbness or weakness, balance or coordination issues. Endocrine:  No diabetes, thyroid issues, hot flashes or night sweats. Psych:  No mood changes, depression or anxiety. Pain:  No focal pain. Review of systems:  All other systems reviewed and found to be negative.  Physical Exam:  Blood pressure 125/84, pulse 99, temperature 98.1 F (36.7 C), temperature source Oral, resp. rate 20, height '5\' 4"'$  (1.626 m), weight 160 lb 6.4 oz (72.8 kg), SpO2 100 %.  GENERAL:  Well developed, well nourished, Cassidy Bradley sitting comfortably on the medical unit in no acute distress. MENTAL STATUS:  Alert and oriented to person, place and time. HEAD:  Curly gray hair.  Normocephalic, atraumatic, face symmetric, no Cushingoid features. EYES:  Glasses.  Brown eyes.  Pupils equal round and reactive to light and accomodation.  No conjunctivitis or scleral icterus. ENT:  Oropharynx clear without lesion.  Tongue normal. Mucous membranes moist.  RESPIRATORY:  Clear to auscultation without rales, wheezes or rhonchi. CARDIOVASCULAR:  Regular rate and rhythm without murmur, rub or gallop. No JVD. ABDOMEN:  Soft, non-tender, with active bowel sounds, and no hepatosplenomegaly.  No masses. SKIN:  No rashes, ulcers or lesions. EXTREMITIES:  Tender anterior thighs on palpation.  1+ left lower extremity edema below the knee (stable). LYMPH NODES: No palpable cervical, supraclavicular, axillary or inguinal adenopathy   NEUROLOGICAL: Appropriate. PSYCH:  Appropriate   Results for orders placed or performed during the hospital encounter of 07/21/16 (from the past 48 hour(s))  Comprehensive metabolic panel     Status: Abnormal   Collection Time: 07/21/16  3:38 PM  Result Value Ref Range   Sodium 139 135 - 145 mmol/L   Potassium 3.3 (L) 3.5 - 5.1 mmol/L   Chloride 107 101 - 111 mmol/L   CO2 25 22 - 32 mmol/L   Glucose, Bld 103 (H) 65 - 99 mg/dL   BUN 9 6 - 20 mg/dL   Creatinine, Ser 0.59 0.44 - 1.00 mg/dL   Calcium 9.1 8.9 - 10.3 mg/dL   Total Protein 6.7 6.5 - 8.1 g/dL   Albumin 3.6 3.5 - 5.0 g/dL   AST 150 (H) 15 - 41 U/L   ALT 200 (H) 14 - 54 U/L   Alkaline Phosphatase 231 (H) 38 - 126 U/L   Total Bilirubin 0.9 0.3 - 1.2 mg/dL   GFR calc non Af Amer >60 >60 mL/min  GFR calc Af Amer >60 >60 mL/min    Comment: (NOTE) The eGFR has been calculated using the CKD EPI equation. This calculation has not been validated in all clinical situations. eGFR's persistently <60 mL/min signify possible Chronic Kidney Disease.    Anion gap 7 5 - 15  CBC     Status: Abnormal   Collection Time: 07/21/16  3:38 PM  Result Value Ref Range   WBC 3.9 3.6 - 11.0 K/uL   RBC 2.39 (L) 3.80 - 5.20 MIL/uL   Hemoglobin 8.0 (L) 12.0 - 16.0 g/dL   HCT 24.7 (L) 35.0 - 47.0 %   MCV 103.4 (H) 80.0 - 100.0 fL   MCH 33.6 26.0 - 34.0 pg   MCHC 32.5 32.0 - 36.0 g/dL   RDW 17.1 (H) 11.5 - 14.5 %   Platelets 86 (L) 150 - 440 K/uL  Protime-INR     Status: None   Collection Time: 07/21/16  3:38 PM  Result Value Ref Range   Prothrombin Time 14.5 11.4 - 15.0 seconds   INR 1.11   APTT     Status: None   Collection Time: 07/21/16  3:38 PM  Result Value Ref Range   aPTT 29 24 - 36 seconds  CK     Status: Abnormal   Collection Time: 07/21/16  3:38 PM  Result Value Ref Range   Total CK 2,549 (H) 38 - 234 U/L  Basic metabolic panel     Status: None   Collection Time: 07/22/16  5:00 AM  Result Value Ref Range   Sodium 141  135 - 145 mmol/L   Potassium 3.6 3.5 - 5.1 mmol/L   Chloride 110 101 - 111 mmol/L   CO2 25 22 - 32 mmol/L   Glucose, Bld 94 65 - 99 mg/dL   BUN 8 6 - 20 mg/dL   Creatinine, Ser 0.55 0.44 - 1.00 mg/dL   Calcium 8.9 8.9 - 10.3 mg/dL   GFR calc non Af Amer >60 >60 mL/min   GFR calc Af Amer >60 >60 mL/min    Comment: (NOTE) The eGFR has been calculated using the CKD EPI equation. This calculation has not been validated in all clinical situations. eGFR's persistently <60 mL/min signify possible Chronic Kidney Disease.    Anion gap 6 5 - 15  CBC     Status: Abnormal   Collection Time: 07/22/16  5:00 AM  Result Value Ref Range   WBC 2.7 (L) 3.6 - 11.0 K/uL   RBC 2.34 (L) 3.80 - 5.20 MIL/uL   Hemoglobin 8.2 (L) 12.0 - 16.0 g/dL   HCT 24.2 (L) 35.0 - 47.0 %   MCV 103.4 (H) 80.0 - 100.0 fL   MCH 35.1 (H) 26.0 - 34.0 pg   MCHC 33.9 32.0 - 36.0 g/dL   RDW 17.1 (H) 11.5 - 14.5 %   Platelets 75 (L) 150 - 440 K/uL  CK     Status: Abnormal   Collection Time: 07/22/16  5:00 AM  Result Value Ref Range   Total CK 1,800 (H) 38 - 234 U/L   No results found.  Assessment:  The patient is a 55 y.o. Cassidy Bradley with metastatic uterine leiomyosarcoma currently day 20 s/p cycle #5 Yondelis.  She has rhabdomyolysis secondary to Yondelis (rare side effect) and associated mild increase in liver function tests.  CPK has increased from 251 (07/02/2016) to 5000 on 07/18/2016.  She has a history of left lower extremity DVT on Xarelto.  Symptomatically, she has tenderness  in her anterior thighs.    Plan:   1.  Oncology:  Patient is day 20 s/p Yondelis.  Secondary to her rise in muscle enzymes, she will not be able to receive any further Yondelis.  We discussed potential clinical trial for mTOR inhibitor at [facility] or pazopanib (Votrient).  She was originally scheduled for chemotherapy this week (cancelled).  Supportive care and hydration for rhabdomyolysis.  2.  Hematology:  Patient has myelosuppression from  Yondelis.  She received Neulasta with her chemotherapy.  She has not required any transfusions in the past.  Follow CBC.  She is on Xarelto for a history of left lower extremity DVT.  3.  Disposition:  Anticipate discharge once CPK has improved.   Thank you for allowing me to participate in Cassidy Bradley 's care.  I will follow her closely with you while hospitalized and after discharge in the outpatient department.  Lequita Asal, MD  07/21/2016

## 2016-07-23 ENCOUNTER — Other Ambulatory Visit: Payer: Self-pay | Admitting: Hematology and Oncology

## 2016-07-23 DIAGNOSIS — M6282 Rhabdomyolysis: Secondary | ICD-10-CM | POA: Diagnosis not present

## 2016-07-23 LAB — CK: Total CK: 997 U/L — ABNORMAL HIGH (ref 38–234)

## 2016-07-23 MED ORDER — FERROUS SULFATE 325 (65 FE) MG PO TABS: 325.0000 mg | ORAL_TABLET | Freq: Two times a day (BID) | ORAL | 0 refills | Status: AC

## 2016-07-23 MED ORDER — FERROUS SULFATE 325 (65 FE) MG PO TABS: 325.0000 mg | ORAL_TABLET | Freq: Two times a day (BID) | ORAL | 0 refills | Status: DC

## 2016-07-23 MED ORDER — FERROUS SULFATE 325 (65 FE) MG PO TABS
325.0000 mg | ORAL_TABLET | Freq: Two times a day (BID) | ORAL | Status: DC
  Administered 2016-07-23: 325 mg via ORAL
  Filled 2016-07-23: qty 1

## 2016-07-23 NOTE — Progress Notes (Signed)
Discharge paperwork reviewed with patient who verbalized understanding. Prescription for Iron given to patient. Patient's husband to transport home.

## 2016-07-23 NOTE — Progress Notes (Signed)
[facility]  Clinic day:  07/24/16  Chief Complaint: Cassidy Bradley is an 55 y.o. female with metastatic uterine leiomyosarcoma who is seen for assessment following recent hospitalization.  HPI: The patient was last seen in the medical oncology clinic on 07/02/2016.  At that time, she received cycle #5 Yondelis.  Prior to treatment, CPK was 251. She was scheduled for follow-up echocardiogram.  Echo on 07/21/2016 revealed an EF of 45-50%.  Following chemotherapy, she developed an aching sensation in her anterior thighs.  The discomfort limited her ability to bend.  She had labs drawn through [facility] last week.  CPK was 1988.  She was contacted about increasing fluids and repeat CPK.  Labs drawn on 07/18/2016 (available 07/21/2016) revealed a CPK of 5000.  LFTs were elevated with an SGOT 247, SGPT 250, and alkaline phosphatase 287.  She was admitted to [facility] from 07/21/2016 - 07/23/2016.  She received hydration and physical therapy.  CPK decreased to 2549 then 1800 then 997 on discharge.  She has done well since being home.  Her legs feel better.  She feels tired, but overall better.  She denies any change in urine output or color.   Past Medical History:  Diagnosis Date  . Breast lump 2006    both breast   . Cancer (HCC)    Uterine Leiomyosarcoma  . Chest wall mass   . DVT (deep venous thrombosis) (HCC)    Left leg  . Uterine cancer [facility])     Past Surgical History:  Procedure Laterality Date  . ABDOMINAL HYSTERECTOMY  01/30/15  . ANKLE SURGERY  2005  . BACK SURGERY  2005  . BREAST BIOPSY Bilateral   . CESAREAN SECTION  1993  . CHOLECYSTECTOMY  2001  . COLONOSCOPY     Family History  Problem Relation Age of Onset  . Breast cancer Sister 78  . Breast cancer Maternal Aunt   . Breast cancer Maternal Grandmother 60  . Stomach cancer Maternal Aunt   . Lung cancer Maternal Grandfather   . Colon polyps Mother   . Cancer - Colon Cousin 52   first cousin  . Lupus Father   . Esophageal cancer Maternal Uncle     Social History:  reports that she has never smoked. She has never used smokeless tobacco. She reports that she does not drink alcohol or use drugs.   The patient is accompanied by her husband today.  Allergies:  Allergies  Allergen Reactions  . Pravastatin Hives and Nausea And Vomiting  . Sulfa Antibiotics Hives and Swelling  . Latex Rash  . Other Rash and Other (See Comments)    Pt states that she is allergic to spandex.   Current Medications: Current Outpatient Prescriptions  Medication Sig Dispense Refill  . ferrous sulfate 325 (65 FE) MG tablet Take 1 tablet (325 mg total) by mouth 2 (two) times daily with a meal. 60 tablet 0  . lidocaine-prilocaine (EMLA) cream Apply 1 application topically as needed. Apply one hour prior to chemotherapy (Patient taking differently: Apply 1 application topically as needed (prior to accessing pts port). Pt applies one hour prior to chemotherapy.) 30 g 1  . Multiple Vitamins-Minerals (ALIVE WOMENS ENERGY) TABS Take 1 tablet by mouth daily.     . ondansetron (ZOFRAN) 8 MG tablet Take 8 mg by mouth every 8 (eight) hours as needed for nausea or vomiting. Reported on 06/26/2016    . polyethylene glycol powder (GLYCOLAX/MIRALAX) powder Take 1 Container  by mouth daily as needed. Reported on 06/26/2016    . XARELTO 20 MG TABS tablet Take 1 tablet by mouth  daily with supper 90 tablet 1  . loratadine (CLARITIN) 10 MG tablet Take 10 mg by mouth daily. Reported on 07/02/2016     No current facility-administered medications for this visit.    Facility-Administered Medications Ordered in Other Visits  Medication Dose Route Frequency Provider Last Rate Last Dose  . 0.9 %  sodium chloride infusion   Intravenous Continuous Lequita Asal, MD   Stopped at 05/01/16 1340  . 0.9 %  sodium chloride infusion   Intravenous Continuous Lequita Asal, MD   Stopped at 06/05/16 1235  . heparin lock  flush 100 unit/mL  500 Units Intracatheter Once PRN Lequita Asal, MD      . heparin lock flush 100 unit/mL  500 Units Intracatheter Once PRN Lequita Asal, MD      . heparin lock flush 100 unit/mL  500 Units Intracatheter Once PRN Lequita Asal, MD      . heparin lock flush 100 unit/mL  500 Units Intracatheter Once PRN Lequita Asal, MD      . sodium chloride 0.9 % injection 10 mL  10 mL Intracatheter PRN Lequita Asal, MD      . sodium chloride flush (NS) 0.9 % injection 10 mL  10 mL Intracatheter PRN Lequita Asal, MD   10 mL at 04/03/16 1241  . sodium chloride flush (NS) 0.9 % injection 10 mL  10 mL Intracatheter PRN Lequita Asal, MD   10 mL at 05/01/16 1225  . sodium chloride flush (NS) 0.9 % injection 10 mL  10 mL Intracatheter PRN Lequita Asal, MD   10 mL at 06/05/16 1135  . sodium chloride flush (NS) 0.9 % injection 10 mL  10 mL Intracatheter PRN Lequita Asal, MD   10 mL at 07/02/16 1050    Review of Systems:  GENERAL:  Feels "pretty good".  No fevers or sweats. Weight down 1 pound. PERFORMANCE STATUS (ECOG):  2 HEENT:  No visual changes,sore throat, mouth sores or tenderness. Lungs: Shortness of breath with exertion (stable).  No cough.  No hemoptysis. Cardiac:  No chest pain, palpitations, orthopnea, or PND. GI:  Reflux/indigestion, little.  No nausea, vomiting, constipation, melena, or hematochezia. GU:  No urgency, frequency, dysuria, or hematuria. Musculoskeletal:  No bone or back pain.  No joint pain.  Muscle tenderness in anterior thighs, improved. Extremities: Left lower extremity swelling, stable. Skin:  No rashes or skin changes. Neuro:  No headache, numbness or weakness, balance or coordination issues. Endocrine:  No diabetes, thyroid issues, hot flashes or night sweats. Psych:  No mood changes, depression or anxiety. Pain:  No focal pain. Review of systems:  All other systems reviewed and found to be negative.                         Physical Exam: Blood pressure 121/89, pulse 93, temperature 97.3 F (36.3 C), temperature source Tympanic, resp. rate 18, weight 159 lb 9.8 oz (72.4 kg).  GENERAL:  Well developed, well nourished, woman sitting comfortably in the exam room in no acute distress. MENTAL STATUS:  Alert and oriented to person, place and time. HEAD:  Curly gray hair.  Normocephalic, atraumatic, face symmetric, no Cushingoid features. EYES:  Glasses.  Brown eyes.  Pupils equal round and reactive to light and accomodation.  No  conjunctivitis or scleral icterus. ENT:  Oropharynx clear without lesion.  Tongue normal. Mucous membranes moist.  RESPIRATORY:  Clear to auscultation without rales, wheezes or rhonchi. CARDIOVASCULAR:  Regular rate and rhythm without murmur, rub or gallop. No JVD. ABDOMEN:  Soft, non-tender, with active bowel sounds, and no hepatosplenomegaly.  No masses. SKIN:  No rashes, ulcers or lesions. EXTREMITIES:  1+ left lower extremity edema below the knee (stable).  Thighs slightly tender on palpation. LYMPH NODES: No palpable cervical, supraclavicular, axillary or inguinal adenopathy  NEUROLOGICAL: Appropriate. PSYCH:  Appropriate.  Laboratory testing: [facility] labs on 02/28/2016 included a hematocrit 36.1, hemoglobin 12.1, platelets 206,000, white count 13,200 with an [place] of 11,400.  Comprehensive metabolic panel included a creatinine of 0.67. Liver function tests included an alkaline phosphatase of 122 (39-117).    [facility] labs on 03/12/2016 included a comprehensive metabolic panel included a creatinine of 0.57.  Liver function tests included an alkaline phosphatase of 118 (39-117) post Neupogen.  Phosphorus was 3.4.  Creatinine kinase was 156 (24-173).  [facility] labs on 04/02/2016 included hematocrit of 33.3, hemoglobin 11.0, platelets 229,000, WBC 2700 with an ANC of 1400.  Comprehensive metabolic panel included a creatinine of 0.61.  Liver function tests included an alkaline  phosphatase of 129 (39-117), AST 54 (0-40) and ALT 76 (0-32).  Phosphorus was 3.9.  Creatinine kinase was 120 (24-173).  [facility] labs on 04/23/2016 included hematocrit of 30.4, hemoglobin 10.0, platelets 266,000, WBC 3200 with an ANC of 1600.  Comprehensive metabolic panel included a creatinine of 0.68.  Liver function tests included an alkaline phosphatase of 195 (39-117), AST 45 (0-40) and ALT 69 (0-32).  Phosphorus was 3.2.  Creatinine kinase was 258 (24-173).  [facility] labs on 04/30/2016 included hematocrit of 32.0, hemoglobin 10.8, platelets 287,000, WBC 2100 with an ANC of 900.  Comprehensive metabolic panel included a creatinine of 0.68.  Liver function tests included an alkaline phosphatase of 166 (39-117), AST 42 (0-40) and ALT 44 (0-32).  Phosphorus was 4.0.  Creatinine kinase was 182 (24-173).  [facility] labs on 05/21/2016 included hematocrit of 28.9, hemoglobin 9.4, platelets 167,000, WBC 2500 with an ANC of 1,100.  Comprehensive metabolic panel included a creatinine of 0.68.  Liver function tests included an alkaline phosphatase of 244 (39-117), AST 63 (0-40) and ALT 86 (0-32).  Phosphorus was 4.0.  Creatinine kinase was 433 (24-173).  [facility] labs on 06/04/2016 included hematocrit of 32.7, hemoglobin 10.6, platelets 287,000, WBC 2800 with an ANC of 1,300. Comprehensive metabolic panel included a creatinine of 0.68. Liver function tests included an alkaline phosphatase of 241 (39-117), AST 64 (0-40) and ALT 67 (0-32). Phosphorus was 4.0. Creatinine kinase was 229 (24-173).  [facility] labs on 06/25/2016 included hematocrit of 28.1, hemoglobin 9.3, platelets 155,000, WBC 2500 with an ANC of 1,000.  Comprehensive metabolic panel included a creatinine of 0.62.  Liver function tests included an alkaline phosphatase of 240 (39-117), AST 72 (0-40) and ALT 80 (0-32).  Phosphorus was 3.6.  Creatinine kinase was 8.6 (0-5.3).   Appointment on 07/24/2016  Component Date Value Ref Range Status  .  Sodium 07/24/2016 136  135 - 145 mmol/L Final  . Potassium 07/24/2016 3.5  3.5 - 5.1 mmol/L Final  . Chloride 07/24/2016 107  101 - 111 mmol/L Final  . CO2 07/24/2016 23  22 - 32 mmol/L Final  . Glucose, Bld 07/24/2016 99  65 - 99 mg/dL Final  . BUN 07/24/2016 9  6 - 20 mg/dL Final  . Creatinine, Ser 07/24/2016  0.52  0.44 - 1.00 mg/dL Final  . Calcium 07/24/2016 9.3  8.9 - 10.3 mg/dL Final  . Total Protein 07/24/2016 7.0  6.5 - 8.1 g/dL Final  . Albumin 07/24/2016 3.8  3.5 - 5.0 g/dL Final  . AST 07/24/2016 89* 15 - 41 U/L Final  . ALT 07/24/2016 140* 14 - 54 U/L Final  . Alkaline Phosphatase 07/24/2016 244* 38 - 126 U/L Final  . Total Bilirubin 07/24/2016 0.7  0.3 - 1.2 mg/dL Final  . GFR calc non Af Amer 07/24/2016 >60  >60 mL/min Final  . GFR calc Af Amer 07/24/2016 >60  >60 mL/min Final   Comment: (NOTE) The eGFR has been calculated using the CKD EPI equation. This calculation has not been validated in all clinical situations. eGFR's persistently <60 mL/min signify possible Chronic Kidney Disease.   . Anion gap 07/24/2016 6  5 - 15 Final  . WBC 07/24/2016 2.6* 3.6 - 11.0 K/uL Corrected  . RBC 07/24/2016 2.41* 3.80 - 5.20 MIL/uL Corrected  . Hemoglobin 07/24/2016 8.4* 12.0 - 16.0 g/dL Corrected  . HCT 07/24/2016 24.4* 35.0 - 47.0 % Corrected  . MCV 07/24/2016 101.4* 80.0 - 100.0 fL Corrected  . MCH 07/24/2016 35.0* 26.0 - 34.0 pg Corrected  . MCHC 07/24/2016 34.6  32.0 - 36.0 g/dL Corrected  . RDW 07/24/2016 17.2* 11.5 - 14.5 % Corrected  . Platelets 07/24/2016 97* 150 - 440 K/uL Corrected  . Neutrophils Relative % 07/24/2016 55%  % Corrected  . Neutro Abs 07/24/2016 1.4  1.4 - 6.5 K/uL Corrected  . Lymphocytes Relative 07/24/2016 29%  % Corrected  . Lymphs Abs 07/24/2016 0.8* 1.0 - 3.6 K/uL Corrected  . Monocytes Relative 07/24/2016 16%  % Corrected  . Monocytes Absolute 07/24/2016 0.4  0.2 - 0.9 K/uL Corrected  . Eosinophils Relative 07/24/2016 0%  % Corrected  .  Eosinophils Absolute 07/24/2016 0.0  0 - 0.7 K/uL Corrected  . Basophils Relative 07/24/2016 0%  % Corrected  . Basophils Absolute 07/24/2016 0.0  0 - 0.1 K/uL Corrected  Admission on 07/21/2016, Discharged on 07/23/2016  Component Date Value Ref Range Status  . Sodium 07/21/2016 139  135 - 145 mmol/L Final  . Potassium 07/21/2016 3.3* 3.5 - 5.1 mmol/L Final  . Chloride 07/21/2016 107  101 - 111 mmol/L Final  . CO2 07/21/2016 25  22 - 32 mmol/L Final  . Glucose, Bld 07/21/2016 103* 65 - 99 mg/dL Final  . BUN 07/21/2016 9  6 - 20 mg/dL Final  . Creatinine, Ser 07/21/2016 0.59  0.44 - 1.00 mg/dL Final  . Calcium 07/21/2016 9.1  8.9 - 10.3 mg/dL Final  . Total Protein 07/21/2016 6.7  6.5 - 8.1 g/dL Final  . Albumin 07/21/2016 3.6  3.5 - 5.0 g/dL Final  . AST 07/21/2016 150* 15 - 41 U/L Final  . ALT 07/21/2016 200* 14 - 54 U/L Final  . Alkaline Phosphatase 07/21/2016 231* 38 - 126 U/L Final  . Total Bilirubin 07/21/2016 0.9  0.3 - 1.2 mg/dL Final  . GFR calc non Af Amer 07/21/2016 >60  >60 mL/min Final  . GFR calc Af Amer 07/21/2016 >60  >60 mL/min Final   Comment: (NOTE) The eGFR has been calculated using the CKD EPI equation. This calculation has not been validated in all clinical situations. eGFR's persistently <60 mL/min signify possible Chronic Kidney Disease.   . Anion gap 07/21/2016 7  5 - 15 Final  . WBC 07/21/2016 3.9  3.6 - 11.0 K/uL Final  .  RBC 07/21/2016 2.39* 3.80 - 5.20 MIL/uL Final  . Hemoglobin 07/21/2016 8.0* 12.0 - 16.0 g/dL Final  . HCT 07/21/2016 24.7* 35.0 - 47.0 % Final  . MCV 07/21/2016 103.4* 80.0 - 100.0 fL Final  . MCH 07/21/2016 33.6  26.0 - 34.0 pg Final  . MCHC 07/21/2016 32.5  32.0 - 36.0 g/dL Final  . RDW 07/21/2016 17.1* 11.5 - 14.5 % Final  . Platelets 07/21/2016 86* 150 - 440 K/uL Final  . Prothrombin Time 07/21/2016 14.5  11.4 - 15.0 seconds Final  . INR 07/21/2016 1.11   Final  . aPTT 07/21/2016 29  24 - 36 seconds Final  . Total CK 07/21/2016  2549* 38 - 234 U/L Final  . Sodium 07/22/2016 141  135 - 145 mmol/L Final  . Potassium 07/22/2016 3.6  3.5 - 5.1 mmol/L Final  . Chloride 07/22/2016 110  101 - 111 mmol/L Final  . CO2 07/22/2016 25  22 - 32 mmol/L Final  . Glucose, Bld 07/22/2016 94  65 - 99 mg/dL Final  . BUN 07/22/2016 8  6 - 20 mg/dL Final  . Creatinine, Ser 07/22/2016 0.55  0.44 - 1.00 mg/dL Final  . Calcium 07/22/2016 8.9  8.9 - 10.3 mg/dL Final  . GFR calc non Af Amer 07/22/2016 >60  >60 mL/min Final  . GFR calc Af Amer 07/22/2016 >60  >60 mL/min Final   Comment: (NOTE) The eGFR has been calculated using the CKD EPI equation. This calculation has not been validated in all clinical situations. eGFR's persistently <60 mL/min signify possible Chronic Kidney Disease.   . Anion gap 07/22/2016 6  5 - 15 Final  . WBC 07/22/2016 2.7* 3.6 - 11.0 K/uL Final  . RBC 07/22/2016 2.34* 3.80 - 5.20 MIL/uL Final  . Hemoglobin 07/22/2016 8.2* 12.0 - 16.0 g/dL Final  . HCT 07/22/2016 24.2* 35.0 - 47.0 % Final  . MCV 07/22/2016 103.4* 80.0 - 100.0 fL Final  . MCH 07/22/2016 35.1* 26.0 - 34.0 pg Final  . MCHC 07/22/2016 33.9  32.0 - 36.0 g/dL Final  . RDW 07/22/2016 17.1* 11.5 - 14.5 % Final  . Platelets 07/22/2016 75* 150 - 440 K/uL Final  . Total CK 07/22/2016 1800* 38 - 234 U/L Final  . Total CK 07/23/2016 997* 38 - 234 U/L Final   Assessment:  Cassidy Bradley is a 55 y.o. female 55 y.o. female with stage IV uterine leiomyosarcoma. She underwent TAH/BSO on 01/30/2015. Pathology confirmed high grade leiomyosarcoma with 2 large adjacent tumor nodules (10 cm and 5 cm) and a separate 0.5 cm serosal nodule.   Chest, abdomen, and pelvic CT scan on 02/12/2015 revealed a 1.7 x 2.3 cm right external iliac node. Pulmonary nodules were scattered throughout the lungs bilaterally. There was left internal mammary chain adenopathy measuring up to 3.6 x 2 cm.  She underwent anterior chest wall pleural-based CT guided biopsy on 02/21/2015.  Pathology was consistent with metastatic leiomyosarcoma. Bone scan on 02/28/2015 revealed no evidence of metastatic disease with indeterminate uptake in the left posterior rib.   She has a fraternal twin who developed breast cancer at age 66. Several other family members have malignancies. 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  Prechemotherapy labs from [facility] revealed leukopenia (WBC 2300 with ANC 900). Work-up to date is negative. She has a family history of leukopenia. Bone marrow on 03/08/2015 revealed no evidence of neoplasia or metastatic disease. Marrow was normocellular for age (30-50%) with  trilineage hematopoiesis. There was no increase in marrow reticulin fibers. Flow cytometry and cytogenetics were normal.  She received 4 cycles of gemcitabine and Taxotere (03/20/2015 - 05/22/2015) with Neulasta support. She received gemcitabine alone on 05/22/2015.  She was admitted on 05/24/2015 with an acute febrile illness. All cultures were negative.   Chest CT on 05/25/2015 revealed early progressive disease. Abdominal and pelvic CT scan on 05/30/2015 revealed stable to slightly increased right external iliac lymph node (1.8 x 2.5 cm).   Echocardiogram on 06/18/2015 revealed EF of 60-65%.  Echo on 10/22/2015 revealed an EF of 65%.  MUGA on 12/25/2015 revealed an EF of 52%.  Echo on 03/03/2016 revealed an EF of 50%.  Echo on 05/21/2016 revealed an EF of 55%.  Echo on 07/21/2016 revealed an EF of 45-50%.  Bilateral lower extremity duplex on 05/30/2015 was negative.  Left upper extremity duplex on 06/08/2015 was negative.  Left lower extremity duplex on 07/19/2015 documented a DVT.   Left lower extremity duplex on 01/31/2016 revealed re-cannulization of the prior DVT involving the left popliteal vein.  She is on Xarelto.   She received 8 cycles of adriamycin (06/07/2015 - 12/06/2015) with Neulasta (On-Pro)  support.  Cycle #3 was complicated by an acute  left lower extremity DVT.  Cycle #6 was postponed a week secondary to neutropenia ([place] 700).  Zinecard was added with cycle #7.  Olaratumab [name] was added with cycle #8.  Chest, abdomen, and pelvic CT scan on 08/07/2015 revealed a partial treatment response with decrease anterior left upper pleural metastasis, stable to decreased pulmonary metastases and decreased right external iliac adenopathy. There were no new sites of disease.  Chest, abdomen, and pelvic CT scan on 10/22/2015 revealed mild decrease in left anterior chest wall mass/internal mammary lymphadenopathy (3.1 x 4.6 cm to 2.5 x 4.4 cm).  There was decreased mild right external iliac lymphadenopathy (1.4 cm to 1.2 cm).  There was diffuse stable bilateral pulmonary metastases (largest 1.1 cm in anterior right lung).  There was no new or progressive metastatic disease.  Chest CT on 12/25/2015 on revealed slight progression in pulmonary nodules (index nodules: 5 mm to 11 mm and 8 mm to 11 mm).  The dominant soft tissue lesion along the left hemi-thorax was 2.2 x 4.3 cm (stable).  She received 3 cycles of single agent olaratumab [name] (12/27/2015 -02/08/2016).  She required GCSF secondary to neutropenia.  PET scan on 02/26/2016 revealed progression of numerous pulmonary nodules/metastasis and mild progression (1.2 cm with SUV 7) of right pelvic nodal metastasis.  In the right lung apex, there was a 1.3 x 1.0 cm (SUV 6.2) previously 0.9 x 0.6 cm.  In the anterior left lung was a 2.0 x 2.2 cm (SUV 12.4) previously 1.1 x 0.9 cm.  In the anterior right middle lobe was a 1.1 x 1.1 cm (SUV 2.9), previously 0.9 x 0.9 cm.  The subpleural mass along the anterior of the left upper lobe was 2.1 x 3.5 cm (not FDG avid), previously 2.2 x 3.4 cm.  Bone scan on 03/05/2016 revealed no evidence of metastatic disease.  She received 5 cycles of trabectedin (Yondelis) (03/13/2016 - 07/02/2016) with  Neulasta support.  She developed rhabdomyolysis with cycle #5 (CPK 5000).    Chest CT on 04/28/2016 revealed innumerable pulmonary metastases which had slightly increased (44m) in size since 02/26/2016.  Symptomatically, she feels better.  Bilateral anterior thigh pain is improving.  CPK is 1024.  Plan: 1.  Discuss events of hospitalization.  Discuss  no further Yondelis.  Discuss closely following CPK.  Encourage fluids.  Discuss follow-up with Dr. Angelina Ok at [facility] re: clinical trial of pazopanib (Votrient) versus mTOR inhibitor.  Discuss referral for protocol if progressive disease noted on scan, otherwise reimaging every 2 months. 2.  Labs today;  CBC with diff, CMP, CPK. 3.  Discuss anemia and planned work-up.  Suspect chemotherapy induced anemia. 4.  Forms for [facility].  Next labs on 07/28/2016:  CBC with diff, CMP, ferritin, iron studies, B12, folate.  Labs prior to next appt. 5.  Continue Xarelto. 6.  PET scan on 08/01/2016 7.  RTC on 08/04/2016 for MD review of imaging and discussion regarding direction of therapy.   Lequita Asal, MD 07/24/2016, 9:46 AM

## 2016-07-23 NOTE — Discharge Summary (Signed)
[facility] [place] NAME: Cassidy Bradley    MR#:  [id number]  DATE OF BIRTH:  1961/07/27  DATE OF ADMISSION:  07/21/2016 ADMITTING PHYSICIAN: Max Sane, MD  DATE OF DISCHARGE: 07/23/2016  2:00 PM  PRIMARY CARE PHYSICIAN: Maryland Pink, MD    ADMISSION DIAGNOSIS:  rhabdomyalosis  DISCHARGE DIAGNOSIS:  Active Problems:   Rhabdomyolysis   DVT (deep venous thrombosis) ([place])   SECONDARY DIAGNOSIS:   Past Medical History:  Diagnosis Date  . Breast lump 2006    both breast   . Cancer ([place])    Uterine Leiomyosarcoma  . Chest wall mass   . DVT (deep venous thrombosis) ([facility])    Left leg  . Uterine cancer [facility]     HOSPITAL COURSE:   1. Acute rhabdomyolysis. This was believed to be secondary to chemotherapy. Patient was given vigorous IV fluids. Patient's CPK was 2549 upon admission. Upon discharge 997. Patient was asymptomatic with regards to any muscle aches or pains. She felt well enough to be discharged home and keep up with hydration at home. 2. Elevated liver function tests. Likely secondary to chemotherapy. 3. Stage IV metastatic uterine leiomyosarcoma. Follow-up with oncology as outpatient. 4. Left leg DVT on Xarelto 5. Pancytopenia and Anemia of chronic disease. I prescribed iron. Consider Procrit as outpatient with oncology 6. Tachycardia. Since her blood pressure is on the lower side I did not want to start any rate controlling medications. Heart rate 102 upon discharge.  DISCHARGE CONDITIONS:   Satisfactory  CONSULTS OBTAINED:   oncology  DRUG ALLERGIES:   Allergies  Allergen Reactions  . Pravastatin Hives and Nausea And Vomiting  . Sulfa Antibiotics Hives and Swelling  . Latex Rash  . Other Rash and Other (See Comments)    Pt states that she is allergic to spandex.    DISCHARGE MEDICATIONS:   Discharge Medication List as of 07/23/2016 10:01 AM    CONTINUE these medications which have CHANGED   Details   ferrous sulfate 325 (65 FE) MG tablet Take 1 tablet (325 mg total) by mouth 2 (two) times daily with a meal., Starting Wed 07/23/2016, Print      CONTINUE these medications which have NOT CHANGED   Details  XARELTO 20 MG TABS tablet Take 1 tablet by mouth  daily with supper, Normal    lidocaine-prilocaine (EMLA) cream Apply 1 application topically as needed. Apply one hour prior to chemotherapy, Starting 05/01/2015, Until Discontinued, Normal    loratadine (CLARITIN) 10 MG tablet Take 10 mg by mouth daily. Reported on 07/02/2016, Until Discontinued, Historical Med    Multiple Vitamins-Minerals (ALIVE WOMENS ENERGY) TABS Take 1 tablet by mouth daily. , Until Discontinued, Historical Med    ondansetron (ZOFRAN) 8 MG tablet Take 8 mg by mouth every 8 (eight) hours as needed for nausea or vomiting. Reported on 06/26/2016, Until Discontinued, Historical Med    polyethylene glycol powder (GLYCOLAX/MIRALAX) powder Take 1 Container by mouth daily as needed. Reported on 06/26/2016, Until Discontinued, Historical Med         DISCHARGE INSTRUCTIONS:   Follow-up with oncology 1 week Follow-up PMD 2 weeks  If you experience worsening of your admission symptoms, develop shortness of breath, life threatening emergency, suicidal or homicidal thoughts you must seek medical attention immediately by calling 911 or calling your MD immediately  if symptoms less severe.  You Must read complete instructions/literature along with all the possible adverse reactions/side effects for all the Medicines you take  and that have been prescribed to you. Take any new Medicines after you have completely understood and accept all the possible adverse reactions/side effects.   Please note  You were cared for by a hospitalist during your hospital stay. If you have any questions about your discharge medications or the care you received while you were in the hospital after you are discharged, you can call the unit and asked to  speak with the hospitalist on call if the hospitalist that took care of you is not available. Once you are discharged, your primary care physician will handle any further medical issues. Please note that NO REFILLS for any discharge medications will be authorized once you are discharged, as it is imperative that you return to your primary care physician (or establish a relationship with a primary care physician if you do not have one) for your aftercare needs so that they can reassess your need for medications and monitor your lab values.    Today   CHIEF COMPLAINT:  No chief complaint on file.   HISTORY OF PRESENT ILLNESS:  Cassidy Bradley  is a 55 y.o. female sent in for rhabdomyolysis   VITAL SIGNS:  Blood pressure 125/84, pulse (!) 102, temperature 97.7 F (36.5 C), temperature source Oral, resp. rate 16, height 5\' 4"  (1.626 m), weight 72.8 kg (160 lb 6.4 oz), SpO2 100 %.    PHYSICAL EXAMINATION:  GENERAL:  55 y.o.-year-old patient lying in the bed with no acute distress.  EYES: Pupils equal, round, reactive to light and accommodation. No scleral icterus. Extraocular muscles intact.  HEENT: Head atraumatic, normocephalic. Oropharynx and nasopharynx clear.  NECK:  Supple, no jugular venous distention. No thyroid enlargement, no tenderness.  LUNGS: Normal breath sounds bilaterally, no wheezing, rales,rhonchi or crepitation. No use of accessory muscles of respiration.  CARDIOVASCULAR: S1, S2 Tachycardic. No murmurs, rubs, or gallops.  ABDOMEN: Soft, non-tender, non-distended. Bowel sounds present. No organomegaly or mass.  EXTREMITIES: No pedal edema, cyanosis, or clubbing.  NEUROLOGIC: Cranial nerves II through XII are intact. Muscle strength 5/5 in all extremities. Sensation intact. Gait not checked.  PSYCHIATRIC: The patient is alert and oriented x 3.  SKIN: No obvious rash, lesion, or ulcer.   DATA REVIEW:   CBC  Recent Labs Lab 07/22/16 0500  WBC 2.7*  HGB 8.2*  HCT  24.2*  PLT 75*    Chemistries   Recent Labs Lab 07/21/16 1538 07/22/16 0500  NA 139 141  K 3.3* 3.6  CL 107 110  CO2 25 25  GLUCOSE 103* 94  BUN 9 8  CREATININE 0.59 0.55  CALCIUM 9.1 8.9  AST 150*  --   ALT 200*  --   ALKPHOS 231*  --   BILITOT 0.9  --     Microbiology Results  Results for orders placed or performed during the hospital encounter of 05/24/15  Blood culture (routine x 2)     Status: None   Collection Time: 05/24/15 11:42 PM  Result Value Ref Range Status   Specimen Description BLOOD  Final   Special Requests NONE  Final   Culture NO GROWTH 5 DAYS  Final   Report Status 05/30/2015 FINAL  Final  Blood culture (routine x 2)     Status: None   Collection Time: 05/24/15 11:42 PM  Result Value Ref Range Status   Specimen Description BLOOD  Final   Special Requests NONE  Final   Culture NO GROWTH 5 DAYS  Final   Report Status 05/30/2015  FINAL  Final    Management plans discussed with the patient, And she is in agreement.  CODE STATUS:     Code Status Orders        Start     Ordered   07/21/16 1529  Full code  Continuous     07/21/16 1530    Code Status History    Date Active Date Inactive Code Status Order ID Comments User Context   05/25/2015  2:47 AM 05/26/2015  4:23 PM Full Code [id number]  Juluis Mire, MD Inpatient      TOTAL TIME TAKING CARE OF THIS PATIENT: 35 minutes.    Loletha Grayer M.D on 07/23/2016 at 3:05 PM  Between 7am to 6pm - Pager - [phone]  After 6pm go to [email or web address] - password [email or web address] [facility]  [phone]  CC: Primary care physician; [place] Pink, MD

## 2016-07-24 ENCOUNTER — Inpatient Hospital Stay: Payer: 59

## 2016-07-24 ENCOUNTER — Other Ambulatory Visit: Payer: Self-pay | Admitting: *Deleted

## 2016-07-24 ENCOUNTER — Inpatient Hospital Stay (HOSPITAL_BASED_OUTPATIENT_CLINIC_OR_DEPARTMENT_OTHER): Payer: 59 | Admitting: Hematology and Oncology

## 2016-07-24 ENCOUNTER — Telehealth: Payer: Self-pay

## 2016-07-24 VITALS — BP 121/89 | HR 93 | Temp 97.3°F | Resp 18 | Wt 159.6 lb

## 2016-07-24 DIAGNOSIS — Z79899 Other long term (current) drug therapy: Secondary | ICD-10-CM

## 2016-07-24 DIAGNOSIS — C7801 Secondary malignant neoplasm of right lung: Secondary | ICD-10-CM

## 2016-07-24 DIAGNOSIS — C782 Secondary malignant neoplasm of pleura: Secondary | ICD-10-CM | POA: Diagnosis not present

## 2016-07-24 DIAGNOSIS — D649 Anemia, unspecified: Secondary | ICD-10-CM

## 2016-07-24 DIAGNOSIS — C55 Malignant neoplasm of uterus, part unspecified: Secondary | ICD-10-CM

## 2016-07-24 DIAGNOSIS — Z86718 Personal history of other venous thrombosis and embolism: Secondary | ICD-10-CM

## 2016-07-24 DIAGNOSIS — C775 Secondary and unspecified malignant neoplasm of intrapelvic lymph nodes: Secondary | ICD-10-CM

## 2016-07-24 DIAGNOSIS — M6282 Rhabdomyolysis: Secondary | ICD-10-CM

## 2016-07-24 DIAGNOSIS — Z7901 Long term (current) use of anticoagulants: Secondary | ICD-10-CM

## 2016-07-24 DIAGNOSIS — C7802 Secondary malignant neoplasm of left lung: Secondary | ICD-10-CM

## 2016-07-24 DIAGNOSIS — R945 Abnormal results of liver function studies: Secondary | ICD-10-CM

## 2016-07-24 DIAGNOSIS — D72819 Decreased white blood cell count, unspecified: Secondary | ICD-10-CM

## 2016-07-24 DIAGNOSIS — R7989 Other specified abnormal findings of blood chemistry: Secondary | ICD-10-CM

## 2016-07-24 LAB — COMPREHENSIVE METABOLIC PANEL
ALT: 140 U/L — ABNORMAL HIGH (ref 14–54)
AST: 89 U/L — ABNORMAL HIGH (ref 15–41)
Albumin: 3.8 g/dL (ref 3.5–5.0)
Alkaline Phosphatase: 244 U/L — ABNORMAL HIGH (ref 38–126)
Anion gap: 6 (ref 5–15)
BUN: 9 mg/dL (ref 6–20)
CO2: 23 mmol/L (ref 22–32)
Calcium: 9.3 mg/dL (ref 8.9–10.3)
Chloride: 107 mmol/L (ref 101–111)
Creatinine, Ser: 0.52 mg/dL (ref 0.44–1.00)
GFR calc Af Amer: >60 mL/min (ref >60)
GFR calc non Af Amer: >60 mL/min (ref >60)
Glucose, Bld: 99 mg/dL (ref 65–99)
Potassium: 3.5 mmol/L (ref 3.5–5.1)
Sodium: 136 mmol/L (ref 135–145)
Total Bilirubin: 0.7 mg/dL (ref 0.3–1.2)
Total Protein: 7 g/dL (ref 6.5–8.1)

## 2016-07-24 LAB — CBC WITH DIFFERENTIAL/PLATELET
Basophils Absolute: 0 10*3/uL (ref 0–0.1)
Basophils Relative: 0 %
Eosinophils Absolute: 0 10*3/uL (ref 0–0.7)
Eosinophils Relative: 0 %
HCT: 24.4 % — ABNORMAL LOW (ref 35.0–47.0)
Hemoglobin: 8.4 g/dL — ABNORMAL LOW (ref 12.0–16.0)
Lymphocytes Relative: 29 %
Lymphs Abs: 0.8 10*3/uL — ABNORMAL LOW (ref 1.0–3.6)
MCH: 35 pg — ABNORMAL HIGH (ref 26.0–34.0)
MCHC: 34.6 g/dL (ref 32.0–36.0)
MCV: 101.4 fL — ABNORMAL HIGH (ref 80.0–100.0)
Monocytes Absolute: 0.4 10*3/uL (ref 0.2–0.9)
Monocytes Relative: 16 %
Neutro Abs: 1.4 10*3/uL (ref 1.4–6.5)
Neutrophils Relative %: 55 %
Platelets: 97 10*3/uL — ABNORMAL LOW (ref 150–440)
RBC: 2.41 MIL/uL — ABNORMAL LOW (ref 3.80–5.20)
RDW: 17.2 % — ABNORMAL HIGH (ref 11.5–14.5)
WBC: 2.6 10*3/uL — ABNORMAL LOW (ref 3.6–11.0)

## 2016-07-24 LAB — CK: Total CK: 1024 U/L — ABNORMAL HIGH (ref 38–234)

## 2016-07-24 NOTE — Progress Notes (Signed)
Patient is here for follow up, she was recently in the hospital and is working on getting her energy back. No complaints today. Needs a refill on her Xarelto.

## 2016-07-24 NOTE — Telephone Encounter (Signed)
Called patient and notified her of her results, advised her to push fluids and to call and let us know if she notes any increasing leg pain or dark urine, she is getting labs done Monday.

## 2016-07-24 NOTE — Telephone Encounter (Signed)
-----   Message from Lequita Asal, MD sent at 07/24/2016  4:44 PM EDT ----- Regarding: CPK  Encourage fluids.  CPK bumped up a little  M  ----- Message ----- From: Interface, Lab In Portal Sent: 07/24/2016  12:01 PM To: Lequita Asal, MD

## 2016-07-27 ENCOUNTER — Encounter: Payer: Self-pay | Admitting: Hematology and Oncology

## 2016-07-29 ENCOUNTER — Encounter: Payer: Self-pay | Admitting: Hematology and Oncology

## 2016-08-01 ENCOUNTER — Ambulatory Visit
Admission: RE | Admit: 2016-08-01 | Discharge: 2016-08-01 | Disposition: A | Discharge status: Home / Self Care | Payer: 59 | Source: Ambulatory Visit | Attending: Hematology and Oncology | Admitting: Hematology and Oncology

## 2016-08-01 ENCOUNTER — Encounter: Payer: Self-pay | Admitting: Hematology and Oncology

## 2016-08-01 DIAGNOSIS — R590 Localized enlarged lymph nodes: Secondary | ICD-10-CM | POA: Diagnosis not present

## 2016-08-01 DIAGNOSIS — R918 Other nonspecific abnormal finding of lung field: Secondary | ICD-10-CM | POA: Diagnosis not present

## 2016-08-01 DIAGNOSIS — C55 Malignant neoplasm of uterus, part unspecified: Secondary | ICD-10-CM | POA: Diagnosis not present

## 2016-08-01 LAB — GLUCOSE, CAPILLARY: Glucose-Capillary: 75 mg/dL (ref 65–99)

## 2016-08-01 MED ORDER — FLUDEOXYGLUCOSE F - 18 (FDG) INJECTION
12.3400 | Freq: Once | INTRAVENOUS | Status: AC | PRN
  Administered 2016-08-01: 12.34 via INTRAVENOUS

## 2016-08-02 NOTE — Progress Notes (Signed)
[facility] day:  08/04/16  Chief Complaint: Cassidy Bradley is an 55 y.o. female with metastatic uterine leiomyosarcoma who is seen for review of interval PET scan and discussion regarding direction of therapy.  HPI: The patient was last seen in the medical oncology clinic on 07/24/2016.  At that time, she was seen in follow-up after recent hospitalization for rhabdomyolysis.  Symptomatically, her legs felt better.  She felt tired, but overall better.  She denied any change in urine output or color.  CPK was 1024 (improved from a peak of 5000).  We discussed discontinuation of Yondelis and restaging studies.  We discussed potential enrollment in the clinical trial at [facility] (pazopanib (Votrient) versus new mTOR inhibitor) if disease had progressed.  PET scan on 08/01/2016 revealed interval improved CT appearance of the chest.  Most of the pulmonary nodules were smaller and demonstrated decrease in metabolic activity. However, the largest lesion in the left upper lobe had increased in metabolic activity (SUV max increased from 12.4 to 17.4) but was fairly similar in size (2.6 cm now compared to 2.3 cm).  There was no mediastinal or hilar hypermetabolic adenopathy.  The right pelvic side was stable in size (SUV max decreased from 6.9 to 2.9).  There was no new abdominal or pelvic lymphadenopathy.  Anemia work-up on 07/28/2016 revealed the following normal labs: ferritin (529), iron saturation (26%), TIBC (287), B12 (1958), and folate (>20).  Symptomatically, she feels a whole lot better.  Her legs are not hurting.   Past Medical History:  Diagnosis Date  . Breast lump 2006    both breast   . Cancer ([place])    Uterine Leiomyosarcoma  . Chest wall mass   . DVT (deep venous thrombosis) (H[facility])    Left leg  . Uterine cancer [facility])     Past Surgical History:  Procedure Laterality Date  . ABDOMINAL HYSTERECTOMY  01/30/15  . ANKLE SURGERY  2005  . BACK  SURGERY  2005  . BREAST BIOPSY Bilateral   . CESAREAN SECTION  1993  . CHOLECYSTECTOMY  2001  . COLONOSCOPY     Family History  Problem Relation Age of Onset  . Breast cancer Sister 77  . Breast cancer Maternal Aunt   . Breast cancer Maternal Grandmother 60  . Stomach cancer Maternal Aunt   . Lung cancer Maternal Grandfather   . Colon polyps Mother   . Cancer - Colon Cousin 74    first cousin  . Lupus Father   . Esophageal cancer Maternal Uncle     Social History:  reports that she has never smoked. She has never used smokeless tobacco. She reports that she does not drink alcohol or use drugs.   The patient is accompanied by her husband today.  Allergies:  Allergies  Allergen Reactions  . Pravastatin Hives and Nausea And Vomiting  . Sulfa Antibiotics Hives and Swelling  . Latex Rash  . Other Rash and Other (See Comments)    Pt states that she is allergic to spandex.   Current Medications: Current Outpatient Prescriptions  Medication Sig Dispense Refill  . ferrous sulfate 325 (65 FE) MG tablet Take 1 tablet (325 mg total) by mouth 2 (two) times daily with a meal. 60 tablet 0  . lidocaine-prilocaine (EMLA) cream Apply 1 application topically as needed. Apply one hour prior to chemotherapy (Patient taking differently: Apply 1 application topically as needed (prior to accessing pts port). Pt applies one hour prior  to chemotherapy.) 30 g 1  . loratadine (CLARITIN) 10 MG tablet Take 10 mg by mouth daily. Reported on 07/02/2016    . Multiple Vitamins-Minerals (ALIVE WOMENS ENERGY) TABS Take 1 tablet by mouth daily.     . ondansetron (ZOFRAN) 8 MG tablet Take 8 mg by mouth every 8 (eight) hours as needed for nausea or vomiting. Reported on 06/26/2016    . polyethylene glycol powder (GLYCOLAX/MIRALAX) powder Take 1 Container by mouth daily as needed. Reported on 06/26/2016    . XARELTO 20 MG TABS tablet Take 1 tablet by mouth  daily with supper 90 tablet 1   No current  facility-administered medications for this visit.    Facility-Administered Medications Ordered in Other Visits  Medication Dose Route Frequency Provider Last Rate Last Dose  . 0.9 %  sodium chloride infusion   Intravenous Continuous Lequita Asal, MD   Stopped at 05/01/16 1340  . 0.9 %  sodium chloride infusion   Intravenous Continuous Lequita Asal, MD   Stopped at 06/05/16 1235  . heparin lock flush 100 unit/mL  500 Units Intracatheter Once PRN Lequita Asal, MD      . heparin lock flush 100 unit/mL  500 Units Intracatheter Once PRN Lequita Asal, MD      . heparin lock flush 100 unit/mL  500 Units Intracatheter Once PRN Lequita Asal, MD      . heparin lock flush 100 unit/mL  500 Units Intracatheter Once PRN Lequita Asal, MD      . sodium chloride 0.9 % injection 10 mL  10 mL Intracatheter PRN Lequita Asal, MD      . sodium chloride flush (NS) 0.9 % injection 10 mL  10 mL Intracatheter PRN Lequita Asal, MD   10 mL at 04/03/16 1241  . sodium chloride flush (NS) 0.9 % injection 10 mL  10 mL Intracatheter PRN Lequita Asal, MD   10 mL at 05/01/16 1225  . sodium chloride flush (NS) 0.9 % injection 10 mL  10 mL Intracatheter PRN Lequita Asal, MD   10 mL at 06/05/16 1135  . sodium chloride flush (NS) 0.9 % injection 10 mL  10 mL Intracatheter PRN Lequita Asal, MD   10 mL at 07/02/16 1050    Review of Systems:  GENERAL:  Feels good.  No fevers or sweats. Weight down 3 pounds. PERFORMANCE STATUS (ECOG):  2 HEENT:  No visual changes,sore throat, mouth sores or tenderness. Lungs: Shortness of breath with exertion (stable).  No cough.  No hemoptysis. Cardiac:  No chest pain, palpitations, orthopnea, or PND. GI:   Mild reflux.  No nausea, vomiting, constipation, melena, or hematochezia. GU:  No urgency, frequency, dysuria, or hematuria. Musculoskeletal:  No bone or back pain.  No joint pain.  Muscle tenderness in anterior thighs,  improved. Extremities: Left lower extremity swelling, stable.  Pain in legs, resolved. Skin:  No rashes or skin changes. Neuro:  No headache, numbness or weakness, balance or coordination issues. Endocrine:  No diabetes, thyroid issues, hot flashes or night sweats. Psych:  No mood changes, depression or anxiety. Pain:  No focal pain. Review of systems:  All other systems reviewed and found to be negative.                        Physical Exam: Blood pressure 128/89, pulse (!) 107, temperature 97.4 F (36.3 C), temperature source Tympanic, resp. rate 18, weight 156  lb 15.5 oz (71.2 kg).  GENERAL:  Well developed, well nourished, woman sitting comfortably in the exam room in no acute distress. MENTAL STATUS:  Alert and oriented to person, place and time. HEAD:  Curly gray hair.  Normocephalic, atraumatic, face symmetric, no Cushingoid features. EYES:  Glasses.  Brown eyes.  Pupils equal round and reactive to light and accomodation.  No conjunctivitis or scleral icterus. ENT:  Oropharynx clear without lesion.  Tongue normal. Mucous membranes moist.  RESPIRATORY:  Clear to auscultation without rales, wheezes or rhonchi. CARDIOVASCULAR:  Regular rate and rhythm without murmur, rub or gallop. No JVD. ABDOMEN:  Soft, non-tender, with active bowel sounds, and no hepatosplenomegaly.  No masses. SKIN:  No rashes, ulcers or lesions. EXTREMITIES:  1+ left lower extremity edema below the knee (stable).  Thighs non-tender to palpation. LYMPH NODES:  No palpable cervical, supraclavicular, axillary or inguinal adenopathy  NEUROLOGICAL: Appropriate. PSYCH:  Appropriate.  Laboratory testing: [facility] labs on 02/28/2016 included a hematocrit 36.1, hemoglobin 12.1, platelets 206,000, white count 13,200 with an Tarnov of 11,400.  Comprehensive metabolic panel included a creatinine of 0.67. Liver function tests included an alkaline phosphatase of 122 (39-117).    [facility] labs on 03/12/2016 included a comprehensive  metabolic panel included a creatinine of 0.57.  Liver function tests included an alkaline phosphatase of 118 (39-117) post Neupogen.  Phosphorus was 3.4.  Creatinine kinase was 156 (24-173).  [facility] labs on 04/02/2016 included hematocrit of 33.3, hemoglobin 11.0, platelets 229,000, WBC 2700 with an ANC of 1400.  Comprehensive metabolic panel included a creatinine of 0.61.  Liver function tests included an alkaline phosphatase of 129 (39-117), AST 54 (0-40) and ALT 76 (0-32).  Phosphorus was 3.9.  Creatinine kinase was 120 (24-173).  [facility] labs on 04/23/2016 included hematocrit of 30.4, hemoglobin 10.0, platelets 266,000, WBC 3200 with an ANC of 1600.  Comprehensive metabolic panel included a creatinine of 0.68.  Liver function tests included an alkaline phosphatase of 195 (39-117), AST 45 (0-40) and ALT 69 (0-32).  Phosphorus was 3.2.  Creatinine kinase was 258 (24-173).  [facility] labs on 04/30/2016 included hematocrit of 32.0, hemoglobin 10.8, platelets 287,000, WBC 2100 with an ANC of 900.  Comprehensive metabolic panel included a creatinine of 0.68.  Liver function tests included an alkaline phosphatase of 166 (39-117), AST 42 (0-40) and ALT 44 (0-32).  Phosphorus was 4.0.  Creatinine kinase was 182 (24-173).  [facility] labs on 05/21/2016 included hematocrit of 28.9, hemoglobin 9.4, platelets 167,000, WBC 2500 with an ANC of 1,100.  Comprehensive metabolic panel included a creatinine of 0.68.  Liver function tests included an alkaline phosphatase of 244 (39-117), AST 63 (0-40) and ALT 86 (0-32).  Phosphorus was 4.0.  Creatinine kinase was 433 (24-173).  [facility] labs on 06/04/2016 included hematocrit of 32.7, hemoglobin 10.6, platelets 287,000, WBC 2800 with an ANC of 1,300. Comprehensive metabolic panel included a creatinine of 0.68. Liver function tests included an alkaline phosphatase of 241 (39-117), AST 64 (0-40) and ALT 67 (0-32). Phosphorus was 4.0. Creatinine kinase was 229  (24-173).  [facility] labs on 06/25/2016 included hematocrit of 28.1, hemoglobin 9.3, platelets 155,000, WBC 2500 with an ANC of 1,000.  Comprehensive metabolic panel included a creatinine of 0.62.  Liver function tests included an alkaline phosphatase of 240 (39-117), AST 72 (0-40) and ALT 80 (0-32).  Phosphorus was 3.6.  Creatinine kinase was 8.6 (0-5.3).   No visits with results within 3 Day(s) from this visit.  Latest known visit with results is:  Hospital Outpatient Visit on 08/01/2016  Component Date Value Ref Range Status  . Glucose-Capillary 08/01/2016 75  65 - 99 mg/dL Final   Assessment:  TATIA PETRUCCI is a 55 y.o. female 55 y.o. female with stage IV uterine leiomyosarcoma. She underwent TAH/BSO on 01/30/2015. Pathology confirmed high grade leiomyosarcoma with 2 large adjacent tumor nodules (10 cm and 5 cm) and a separate 0.5 cm serosal nodule.   Chest, abdomen, and pelvic CT scan on 02/12/2015 revealed a 1.7 x 2.3 cm right external iliac node. Pulmonary nodules were scattered throughout the lungs bilaterally. There was left internal mammary chain adenopathy measuring up to 3.6 x 2 cm.  She underwent anterior chest wall pleural-based CT guided biopsy on 02/21/2015. Pathology was consistent with metastatic leiomyosarcoma. Bone scan on 02/28/2015 revealed no evidence of metastatic disease with indeterminate uptake in the left posterior rib.   She has a fraternal twin who developed breast cancer at age 18. Several other family members have malignancies. 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  Prechemotherapy labs from [facility] revealed leukopenia (WBC 2300 with ANC 900). Work-up to date is negative. She has a family history of leukopenia. Bone marrow on 03/08/2015 revealed no evidence of neoplasia or metastatic disease. Marrow was normocellular for age (30-50%) with trilineage hematopoiesis.  There was no increase in marrow reticulin fibers. Flow cytometry and cytogenetics were normal.  She received 4 cycles of gemcitabine and Taxotere (03/20/2015 - 05/22/2015) with Neulasta support. She received gemcitabine alone on 05/22/2015.  She was admitted on 05/24/2015 with an acute febrile illness. All cultures were negative.   Chest CT on 05/25/2015 revealed early progressive disease. Abdominal and pelvic CT scan on 05/30/2015 revealed stable to slightly increased right external iliac lymph node (1.8 x 2.5 cm).   Echocardiogram on 06/18/2015 revealed EF of 60-65%.  Echo on 10/22/2015 revealed an EF of 65%.  MUGA on 12/25/2015 revealed an EF of 52%.  Echo on 03/03/2016 revealed an EF of 50%.  Echo on 05/21/2016 revealed an EF of 55%.  Echo on 07/21/2016 revealed an EF of 45-50%.  Bilateral lower extremity duplex on 05/30/2015 was negative.  Left upper extremity duplex on 06/08/2015 was negative.  Left lower extremity duplex on 07/19/2015 documented a DVT.   Left lower extremity duplex on 01/31/2016 revealed re-cannulization of the prior DVT involving the left popliteal vein.  She is on Xarelto.   She received 8 cycles of adriamycin (06/07/2015 - 12/06/2015) with Neulasta (On-Pro) support.  Cycle #3 was complicated by an acute  left lower extremity DVT.  Cycle #6 was postponed a week secondary to neutropenia ([place] 700).  Zinecard was added with cycle #7.  Olaratumab [name] was added with cycle #8.  Chest, abdomen, and pelvic CT scan on 08/07/2015 revealed a partial treatment response with decrease anterior left upper pleural metastasis, stable to decreased pulmonary metastases and decreased right external iliac adenopathy. There were no new sites of disease.  Chest, abdomen, and pelvic CT scan on 10/22/2015 revealed mild decrease in left anterior chest wall mass/internal mammary lymphadenopathy (3.1 x 4.6 cm to 2.5 x 4.4 cm).  There was decreased mild right external iliac lymphadenopathy (1.4  cm to 1.2 cm).  There was diffuse stable bilateral pulmonary metastases (largest 1.1 cm in anterior right lung).  There was no new or progressive metastatic disease.  Chest CT on 12/25/2015 on revealed slight progression in pulmonary nodules (index nodules: 5 mm to 11 mm and 8 mm  to 11 mm).  The dominant soft tissue lesion along the left hemi-thorax was 2.2 x 4.3 cm (stable).  She received 3 cycles of single agent olaratumab [name] (12/27/2015 -02/08/2016).  She required GCSF secondary to neutropenia.  PET scan on 02/26/2016 revealed progression of numerous pulmonary nodules/metastasis and mild progression (1.2 cm with SUV 7) of right pelvic nodal metastasis.  In the right lung apex, there was a 1.3 x 1.0 cm (SUV 6.2) previously 0.9 x 0.6 cm.  In the anterior left lung was a 2.0 x 2.2 cm (SUV 12.4) previously 1.1 x 0.9 cm.  In the anterior right middle lobe was a 1.1 x 1.1 cm (SUV 2.9), previously 0.9 x 0.9 cm.  The subpleural mass along the anterior of the left upper lobe was 2.1 x 3.5 cm (not FDG avid), previously 2.2 x 3.4 cm.  Bone scan on 03/05/2016 revealed no evidence of metastatic disease.  She received 5 cycles of trabectedin (Yondelis) (03/13/2016 - 07/02/2016) with Neulasta support.  She developed rhabdomyolysis with cycle #5 (CPK 5000).    Chest CT on 04/28/2016 revealed innumerable pulmonary metastases which had slightly increased (22m) in size since 02/26/2016.  PET scan on 08/01/2016 revealed that most of the pulmonary nodules were smaller and demonstrated decrease in metabolic activity. However, the largest lesion in the left upper lobe had increased in metabolic activity (SUV max increased from 12.4 to 17.4) but was fairly similar in size (2.6 cm now compared to 2.3 cm).  There was no mediastinal or hilar hypermetabolic adenopathy.  The right pelvic side wais stable in size (SUV max decreased from 6.9 to 2.9).  There was no new abdominal or pelvic lymphadenopathy.  Anemia work-up  on 07/28/2016 revealed the following normal labs: ferritin (529), iron saturation (26%), TIBC (287), B12, and folate.  Symptomatically, she feels better.  Bilateral anterior thigh pain has resolved.  CPK was 529 on 07/28/2016 and 367 (24-173) on 08/01/2016.  Plan: 1.  Review PET scan.  Discuss plan for close monitoring (scans every 2 months) with referral for protocol enrollment at [facility]. 2.  Phone follow-up with Dr. RArdelle Antonprotocol nurse. 3.  Continue Xarelto. 4.  [facility] lab slips. 5.  Schedule chest, abdomen, and pelvic CT scan on 10/01/2016. 6.  RTC in 2 months for MD assessment, review of [facility] labs and CT scans.   MLequita Asal MD 08/04/2016, 10:59 AM

## 2016-08-04 ENCOUNTER — Encounter: Payer: Self-pay | Admitting: Hematology and Oncology

## 2016-08-04 ENCOUNTER — Inpatient Hospital Stay: Payer: 59 | Attending: Hematology and Oncology | Admitting: Hematology and Oncology

## 2016-08-04 VITALS — BP 128/89 | HR 107 | Temp 97.4°F | Resp 18 | Wt 157.0 lb

## 2016-08-04 DIAGNOSIS — Z86718 Personal history of other venous thrombosis and embolism: Secondary | ICD-10-CM | POA: Diagnosis not present

## 2016-08-04 DIAGNOSIS — Z79899 Other long term (current) drug therapy: Secondary | ICD-10-CM | POA: Diagnosis not present

## 2016-08-04 DIAGNOSIS — D649 Anemia, unspecified: Secondary | ICD-10-CM

## 2016-08-04 DIAGNOSIS — D638 Anemia in other chronic diseases classified elsewhere: Secondary | ICD-10-CM

## 2016-08-04 DIAGNOSIS — C7800 Secondary malignant neoplasm of unspecified lung: Secondary | ICD-10-CM | POA: Diagnosis not present

## 2016-08-04 DIAGNOSIS — Z9221 Personal history of antineoplastic chemotherapy: Secondary | ICD-10-CM | POA: Diagnosis not present

## 2016-08-04 DIAGNOSIS — C55 Malignant neoplasm of uterus, part unspecified: Secondary | ICD-10-CM

## 2016-08-04 DIAGNOSIS — R748 Abnormal levels of other serum enzymes: Secondary | ICD-10-CM

## 2016-08-04 MED ORDER — RIVAROXABAN 20 MG PO TABS: ORAL_TABLET | ORAL | 1 refills | Status: DC

## 2016-08-04 NOTE — Progress Notes (Signed)
Patient is here for follow up, she is doing well no complaints.  Needs Rx for her xeralto to [facility]

## 2016-08-11 ENCOUNTER — Telehealth: Payer: Self-pay | Admitting: *Deleted

## 2016-08-11 NOTE — Telephone Encounter (Signed)
Asking that Judeen Hammans or Dr Mike Gip call her regarding the fact that she has not heard anything form any one regarding the clinical trial

## 2016-08-11 NOTE — Telephone Encounter (Signed)
I called pt and told her that I have been leaving messages with different people- one line even said clinical trials-sarcoma.  The number I was given ad direct number ended up being [facility] newsletter office.  Another number was Dr. Angelina Ok asst [place] and she did not know about clinical trials.  Today after all the messages I left I spoke to oncology director of clinical trials Glenis Smoker [phone] and she said the person that has sarcoma is off today but she would pass message to her supervisor and then 20 min later Dr. Angelina Ok called and he states that the pulm nodule that grew was very minimal of.3 and all other areas were stable or shrank so it would not be considered progressive disease.   He did say that if dr Mike Gip was very concerned about the one nodule she could offer ablation or radiation but would probably just monitor her every 2 months with ct scan.  The clinical trial that dr Mike Gip spoke to reidel about got temporarly closed last week due to adverse events on votrient but would probably be back open in 2 months.  I called pt and let her know that her one nodule was not considered progression and we would monitor her.  She is thinking about sec. Opinion and I told her now was a good time to get one while she is on surveillence.Marland Kitchen

## 2016-08-22 ENCOUNTER — Encounter: Payer: Self-pay | Admitting: Hematology and Oncology

## 2016-08-25 ENCOUNTER — Other Ambulatory Visit: Payer: Self-pay | Admitting: Family Medicine

## 2016-08-25 DIAGNOSIS — Z1231 Encounter for screening mammogram for malignant neoplasm of breast: Secondary | ICD-10-CM

## 2016-08-29 ENCOUNTER — Inpatient Hospital Stay: Payer: 59 | Attending: Hematology and Oncology

## 2016-08-29 DIAGNOSIS — C775 Secondary and unspecified malignant neoplasm of intrapelvic lymph nodes: Secondary | ICD-10-CM | POA: Insufficient documentation

## 2016-08-29 DIAGNOSIS — C7801 Secondary malignant neoplasm of right lung: Secondary | ICD-10-CM | POA: Diagnosis not present

## 2016-08-29 DIAGNOSIS — Z452 Encounter for adjustment and management of vascular access device: Secondary | ICD-10-CM | POA: Insufficient documentation

## 2016-08-29 DIAGNOSIS — C55 Malignant neoplasm of uterus, part unspecified: Secondary | ICD-10-CM | POA: Insufficient documentation

## 2016-08-29 DIAGNOSIS — C782 Secondary malignant neoplasm of pleura: Secondary | ICD-10-CM | POA: Insufficient documentation

## 2016-08-29 DIAGNOSIS — C7802 Secondary malignant neoplasm of left lung: Secondary | ICD-10-CM | POA: Diagnosis not present

## 2016-08-29 DIAGNOSIS — Z95828 Presence of other vascular implants and grafts: Secondary | ICD-10-CM

## 2016-08-29 MED ORDER — HEPARIN SOD (PORK) LOCK FLUSH 100 UNIT/ML IV SOLN
500.0000 [IU] | Freq: Once | INTRAVENOUS | Status: AC
  Administered 2016-08-29: 500 [IU] via INTRAVENOUS

## 2016-08-29 MED ORDER — SODIUM CHLORIDE 0.9% FLUSH
10.0000 mL | INTRAVENOUS | Status: AC | PRN
  Administered 2016-08-29: 10 mL via INTRAVENOUS
  Filled 2016-08-29: qty 10

## 2016-09-02 ENCOUNTER — Telehealth: Payer: Self-pay | Admitting: *Deleted

## 2016-09-02 NOTE — Telephone Encounter (Signed)
Called pt per request of corcoran with results  That ck is better at 237 and anc 700.  Wanted me to go over neutropenic precautions. She knows to call if she has temp. Over 100.4. Washing hands very good and when sink not available to use hand sanitizer.  She tells me she has appt with MD Ouida Sills next week. She is flying out with her son and husband.  I told her that she may want to have a mask just in case she is put near someone coughing, not feeling well or little kids and she will come by and get some just in case and I left some at the desk downstairs

## 2016-09-02 NOTE — Telephone Encounter (Signed)
-----   Message from Lequita Asal, MD sent at 09/02/2016  8:55 AM EDT ----- Regarding: Please call patient  Neutropenic precautions ([place] 700) from [facility] labs.  CK improving (235).  Almost normal (24-173).  M

## 2016-09-08 ENCOUNTER — Ambulatory Visit
Admission: RE | Admit: 2016-09-08 | Discharge: 2016-09-08 | Disposition: A | Discharge status: Home / Self Care | Payer: 59 | Source: Ambulatory Visit | Attending: Family Medicine | Admitting: Family Medicine

## 2016-09-08 ENCOUNTER — Other Ambulatory Visit: Payer: Self-pay | Admitting: Family Medicine

## 2016-09-08 DIAGNOSIS — Z1231 Encounter for screening mammogram for malignant neoplasm of breast: Secondary | ICD-10-CM

## 2016-10-01 ENCOUNTER — Ambulatory Visit: Payer: 59

## 2016-10-02 ENCOUNTER — Encounter: Payer: Self-pay | Admitting: Hematology and Oncology

## 2016-10-03 ENCOUNTER — Encounter: Payer: Self-pay | Admitting: *Deleted

## 2016-10-03 ENCOUNTER — Inpatient Hospital Stay: Payer: 59 | Attending: Hematology and Oncology | Admitting: Hematology and Oncology

## 2016-10-03 ENCOUNTER — Inpatient Hospital Stay: Payer: 59

## 2016-10-03 ENCOUNTER — Encounter: Payer: Self-pay | Admitting: Hematology and Oncology

## 2016-10-03 VITALS — BP 123/85 | HR 94 | Temp 96.6°F | Resp 18 | Wt 156.1 lb

## 2016-10-03 DIAGNOSIS — I82532 Chronic embolism and thrombosis of left popliteal vein: Secondary | ICD-10-CM

## 2016-10-03 DIAGNOSIS — Z23 Encounter for immunization: Secondary | ICD-10-CM | POA: Insufficient documentation

## 2016-10-03 DIAGNOSIS — C782 Secondary malignant neoplasm of pleura: Secondary | ICD-10-CM

## 2016-10-03 DIAGNOSIS — Z452 Encounter for adjustment and management of vascular access device: Secondary | ICD-10-CM | POA: Diagnosis not present

## 2016-10-03 DIAGNOSIS — Z7901 Long term (current) use of anticoagulants: Secondary | ICD-10-CM

## 2016-10-03 DIAGNOSIS — Z86718 Personal history of other venous thrombosis and embolism: Secondary | ICD-10-CM | POA: Insufficient documentation

## 2016-10-03 DIAGNOSIS — C7801 Secondary malignant neoplasm of right lung: Secondary | ICD-10-CM

## 2016-10-03 DIAGNOSIS — C7802 Secondary malignant neoplasm of left lung: Secondary | ICD-10-CM

## 2016-10-03 DIAGNOSIS — C7800 Secondary malignant neoplasm of unspecified lung: Secondary | ICD-10-CM

## 2016-10-03 DIAGNOSIS — C775 Secondary and unspecified malignant neoplasm of intrapelvic lymph nodes: Secondary | ICD-10-CM | POA: Diagnosis not present

## 2016-10-03 DIAGNOSIS — C55 Malignant neoplasm of uterus, part unspecified: Secondary | ICD-10-CM | POA: Diagnosis not present

## 2016-10-03 MED ORDER — INFLUENZA VAC SPLIT QUAD 0.5 ML IM SUSY
0.5000 mL | PREFILLED_SYRINGE | Freq: Once | INTRAMUSCULAR | Status: AC
  Administered 2016-10-03: 0.5 mL via INTRAMUSCULAR
  Filled 2016-10-03: qty 0.5

## 2016-10-03 NOTE — Progress Notes (Signed)
Patient brought lab and CT results from MD [facility] today.  Patient states in the mornings her hands "stiff and drawn".  States her right ring finger will draw all the way into her palm and she has to straighten it out with her left hand.  Patient also wants to get flu vaccine today.

## 2016-10-03 NOTE — Progress Notes (Signed)
[facility] day:  10/03/16  Chief Complaint: Cassidy Bradley is an 55 y.o. female with metastatic uterine leiomyosarcoma who is seen for reassessment after interval consultation at [facility].  HPI: The patient was last seen in the medical oncology clinic on 08/04/2016.  At that time, she had recovered from rhabdomyolysis from [place].  PET scan had revealed improvement in most pulmonary nodules with only a 3 mm change in the LUL pulmonary nodule, but with increased SUV (12.4 to 17.4).  She was not eligible for protocol enrollment at [facility] until clear progressive disease.  She sought second opinion at [facility].  Recommendation was for radiation therapy to the left chest wall followed by pazopanib.  Chest, abdomen, and pelvic CT scan at [facility] on 09/10/2016 revealed multiple bilateral pulmonary metastasis some of which have increased in size while others have decreased. The bilobed left upper lobe mass extended to the anterior pleura and measured 4.2 x 3.3 cm and (previously 3.3 x 2.2 cm).  There was new small liver hypodensities concerning for early metastatic disease. There was stable mildly prominent right external iliac lymph nodes.  Labs at [facility] included a hematocrit 38.4, hemoglobin 12, platelets 184,000, white count 2700 with an ANC of 1300. Metabolic panel included a creatinine of 0.72. Liver function tests included an ALT of 59, AST 55, and alkaline phosphatase 199. CK was 158.  LDH was 509.  Uric acid was 4.3  Symptomatically, she has stiff fingers in the morning.  She denies any chest pain or shortness of breath. She denies any lower extremity discomfort associated with her prior rhabdomyolysis.   Past Medical History:  Diagnosis Date  . Breast lump 2006    both breast   . Cancer ([place])    Uterine Leiomyosarcoma  . Chest wall mass   . DVT (deep venous thrombosis) (HCC)    Left leg  . Uterine cancer ([place]) 01/2015    chemo    Past Surgical History:  Procedure Laterality Date  . ABDOMINAL HYSTERECTOMY  01/30/15  . ANKLE SURGERY  2005  . BACK SURGERY  2005  . BREAST BIOPSY Bilateral 10 + yrs ago   neg  . CESAREAN SECTION  1993  . CHOLECYSTECTOMY  2001  . COLONOSCOPY     Family History  Problem Relation Age of Onset  . Colon polyps Mother   . Lupus Father   . Esophageal cancer Maternal Uncle   . Breast cancer Sister 42    fraternal twin sister  . Breast cancer Maternal Aunt   . Breast cancer Maternal Grandmother 60  . Stomach cancer Maternal Aunt   . Lung cancer Maternal Grandfather   . Cancer - Colon Cousin 46    first cousin    Social History:  reports that she has never smoked. She has never used smokeless tobacco. She reports that she does not drink alcohol or use drugs.   The patient is accompanied by her husband today.  Allergies:  Allergies  Allergen Reactions  . Pravastatin Hives and Nausea And Vomiting  . Sulfa Antibiotics Hives and Swelling  . Latex Rash  . Other Rash and Other (See Comments)    Pt states that she is allergic to spandex.   Current Medications: Current Outpatient Prescriptions  Medication Sig Dispense Refill  . ferrous sulfate 325 (65 FE) MG tablet Take 1 tablet (325 mg total) by mouth 2 (two) times daily with a meal. 60 tablet 0  .  lidocaine-prilocaine (EMLA) cream Apply 1 application topically as needed. Apply one hour prior to chemotherapy (Patient taking differently: Apply 1 application topically as needed (prior to accessing pts port). Pt applies one hour prior to chemotherapy.) 30 g 1  . loratadine (CLARITIN) 10 MG tablet Take 10 mg by mouth daily. Reported on 07/02/2016    . Multiple Vitamins-Minerals (ALIVE WOMENS ENERGY) TABS Take 1 tablet by mouth daily.     . ondansetron (ZOFRAN) 8 MG tablet Take 8 mg by mouth every 8 (eight) hours as needed for nausea or vomiting. Reported on 06/26/2016    . polyethylene glycol powder (GLYCOLAX/MIRALAX) powder Take 1  Container by mouth daily as needed. Reported on 06/26/2016    . rivaroxaban (XARELTO) 20 MG TABS tablet Take 1 tablet by mouth  daily with supper 90 tablet 1   No current facility-administered medications for this visit.    Facility-Administered Medications Ordered in Other Visits  Medication Dose Route Frequency Provider Last Rate Last Dose  . 0.9 %  sodium chloride infusion   Intravenous Continuous Lequita Asal, MD   Stopped at 05/01/16 1340  . 0.9 %  sodium chloride infusion   Intravenous Continuous Lequita Asal, MD   Stopped at 06/05/16 1235  . heparin lock flush 100 unit/mL  500 Units Intracatheter Once PRN Lequita Asal, MD      . heparin lock flush 100 unit/mL  500 Units Intracatheter Once PRN Lequita Asal, MD      . heparin lock flush 100 unit/mL  500 Units Intracatheter Once PRN Lequita Asal, MD      . heparin lock flush 100 unit/mL  500 Units Intracatheter Once PRN Lequita Asal, MD      . sodium chloride 0.9 % injection 10 mL  10 mL Intracatheter PRN Lequita Asal, MD      . sodium chloride flush (NS) 0.9 % injection 10 mL  10 mL Intracatheter PRN Lequita Asal, MD   10 mL at 04/03/16 1241  . sodium chloride flush (NS) 0.9 % injection 10 mL  10 mL Intracatheter PRN Lequita Asal, MD   10 mL at 05/01/16 1225  . sodium chloride flush (NS) 0.9 % injection 10 mL  10 mL Intracatheter PRN Lequita Asal, MD   10 mL at 06/05/16 1135  . sodium chloride flush (NS) 0.9 % injection 10 mL  10 mL Intracatheter PRN Lequita Asal, MD   10 mL at 07/02/16 1050  . sodium chloride flush (NS) 0.9 % injection 10 mL  10 mL Intravenous PRN Lequita Asal, MD   10 mL at 08/29/16 1449    Review of Systems:  GENERAL:  Feels pretty good.  No fevers or sweats. Weight stable. PERFORMANCE STATUS (ECOG):  2 HEENT:  No visual changes,sore throat, mouth sores or tenderness. Lungs: No shortness of breath or cough.  No hemoptysis. Cardiac:  No chest  pain, palpitations, orthopnea, or PND. GI:   No nausea, vomiting, constipation, melena, or hematochezia. GU:  No urgency, frequency, dysuria, or hematuria. Musculoskeletal:  No bone or back pain.  Hands stiff in morning.  Muscle tenderness in anterior thighs, resolved. Extremities: Left lower extremity swelling, stable.  Pain in legs, resolved. Skin:  No rashes or skin changes. Neuro:  No headache, numbness or weakness, balance or coordination issues. Endocrine:  No diabetes, thyroid issues, hot flashes or night sweats. Psych:  No mood changes, depression or anxiety. Pain:  No focal pain. Review  of systems:  All other systems reviewed and found to be negative.                        Physical Exam: Blood pressure 123/85, pulse 94, temperature (!) 96.6 F (35.9 C), temperature source Tympanic, resp. rate 18, weight 156 lb 1.4 oz (70.8 kg).  GENERAL:  Well developed, well nourished, woman sitting comfortably in the exam room in no acute distress. MENTAL STATUS:  Alert and oriented to person, place and time. HEAD:  Curly gray hair.  Normocephalic, atraumatic, face symmetric, no Cushingoid features. EYES:  Glasses.  Brown eyes.  Pupils equal round and reactive to light and accomodation.  No conjunctivitis or scleral icterus. ENT:  Oropharynx clear without lesion.  Tongue normal. Mucous membranes moist.  RESPIRATORY:  Clear to auscultation without rales, wheezes or rhonchi. CARDIOVASCULAR:  Regular rate and rhythm without murmur, rub or gallop. No JVD. ABDOMEN:  Soft, non-tender, with active bowel sounds, and no hepatosplenomegaly.  No masses. SKIN:  No rashes, ulcers or lesions. EXTREMITIES:  1+ left lower extremity edema below the knee (stable).  Thighs non-tender to palpation. LYMPH NODES:  No palpable cervical, supraclavicular, axillary or inguinal adenopathy  NEUROLOGICAL: Appropriate. PSYCH:  Appropriate.  Laboratory testing: [facility] labs on 02/28/2016 included a hematocrit 36.1,  hemoglobin 12.1, platelets 206,000, white count 13,200 with an Frontier of 11,400.  Comprehensive metabolic panel included a creatinine of 0.67. Liver function tests included an alkaline phosphatase of 122 (39-117).    [facility] labs on 03/12/2016 included a comprehensive metabolic panel included a creatinine of 0.57.  Liver function tests included an alkaline phosphatase of 118 (39-117) post Neupogen.  Phosphorus was 3.4.  Creatinine kinase was 156 (24-173).  [facility] labs on 04/02/2016 included hematocrit of 33.3, hemoglobin 11.0, platelets 229,000, WBC 2700 with an ANC of 1400.  Comprehensive metabolic panel included a creatinine of 0.61.  Liver function tests included an alkaline phosphatase of 129 (39-117), AST 54 (0-40) and ALT 76 (0-32).  Phosphorus was 3.9.  Creatinine kinase was 120 (24-173).  [facility] labs on 04/23/2016 included hematocrit of 30.4, hemoglobin 10.0, platelets 266,000, WBC 3200 with an ANC of 1600.  Comprehensive metabolic panel included a creatinine of 0.68.  Liver function tests included an alkaline phosphatase of 195 (39-117), AST 45 (0-40) and ALT 69 (0-32).  Phosphorus was 3.2.  Creatinine kinase was 258 (24-173).  [facility] labs on 04/30/2016 included hematocrit of 32.0, hemoglobin 10.8, platelets 287,000, WBC 2100 with an ANC of 900.  Comprehensive metabolic panel included a creatinine of 0.68.  Liver function tests included an alkaline phosphatase of 166 (39-117), AST 42 (0-40) and ALT 44 (0-32).  Phosphorus was 4.0.  Creatinine kinase was 182 (24-173).  [facility] labs on 05/21/2016 included hematocrit of 28.9, hemoglobin 9.4, platelets 167,000, WBC 2500 with an ANC of 1,100.  Comprehensive metabolic panel included a creatinine of 0.68.  Liver function tests included an alkaline phosphatase of 244 (39-117), AST 63 (0-40) and ALT 86 (0-32).  Phosphorus was 4.0.  Creatinine kinase was 433 (24-173).  [facility] labs on 06/04/2016 included hematocrit of 32.7, hemoglobin 10.6, platelets  287,000, WBC 2800 with an ANC of 1,300. Comprehensive metabolic panel included a creatinine of 0.68. Liver function tests included an alkaline phosphatase of 241 (39-117), AST 64 (0-40) and ALT 67 (0-32). Phosphorus was 4.0. Creatinine kinase was 229 (24-173).  [facility] labs on 06/25/2016 included hematocrit of 28.1, hemoglobin 9.3, platelets 155,000, WBC 2500 with an ANC of 1,000.  Comprehensive metabolic panel included a creatinine of 0.62.  Liver function tests included an alkaline phosphatase of 240 (39-117), AST 72 (0-40) and ALT 80 (0-32).  Phosphorus was 3.6.  Creatinine kinase was 8.6 (0-5.3).  [facility] labs on 10/02/2016 included a hematocrit of 37.8, hemoglobin 12.6, platelets 181,000, white count 2000 with an Braxton of 800. Comprehensive metabolic panel included a creatinine of 0.74. Liver function tests included an alkaline phosphatase of 187, SGOT 54 and SGPT 45. CK was 219 (24-173).  LDH was 193.   No visits with results within 3 Day(s) from this visit.  Latest known visit with results is:  Hospital Outpatient Visit on 08/01/2016  Component Date Value Ref Range Status  . Glucose-Capillary 08/01/2016 75  65 - 99 mg/dL Final   Assessment:  RONIESHA HOLLINGSHEAD is a 55 y.o. female 55 y.o. female with stage IV uterine leiomyosarcoma. She underwent TAH/BSO on 01/30/2015. Pathology confirmed high grade leiomyosarcoma with 2 large adjacent tumor nodules (10 cm and 5 cm) and a separate 0.5 cm serosal nodule.   Chest, abdomen, and pelvic CT scan on 02/12/2015 revealed a 1.7 x 2.3 cm right external iliac node. Pulmonary nodules were scattered throughout the lungs bilaterally. There was left internal mammary chain adenopathy measuring up to 3.6 x 2 cm.  She underwent anterior chest wall pleural-based CT guided biopsy on 02/21/2015. Pathology was consistent with metastatic leiomyosarcoma. Bone scan on 02/28/2015 revealed no evidence of metastatic disease with indeterminate uptake in the left  posterior rib.   She has a fraternal twin who developed breast cancer at age 64. Several other family members have malignancies. 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  Prechemotherapy labs from [facility] revealed leukopenia (WBC 2300 with ANC 900). Work-up to date is negative. She has a family history of leukopenia. Bone marrow on 03/08/2015 revealed no evidence of neoplasia or metastatic disease. Marrow was normocellular for age (30-50%) with trilineage hematopoiesis. There was no increase in marrow reticulin fibers. Flow cytometry and cytogenetics were normal.  She received 4 cycles of gemcitabine and Taxotere (03/20/2015 - 05/22/2015) with Neulasta support. She received gemcitabine alone on 05/22/2015.  She was admitted on 05/24/2015 with an acute febrile illness. All cultures were negative.   Chest CT on 05/25/2015 revealed early progressive disease. Abdominal and pelvic CT scan on 05/30/2015 revealed stable to slightly increased right external iliac lymph node (1.8 x 2.5 cm).   Echocardiogram on 06/18/2015 revealed EF of 60-65%.  Echo on 10/22/2015 revealed an EF of 65%.  MUGA on 12/25/2015 revealed an EF of 52%.  Echo on 03/03/2016 revealed an EF of 50%.  Echo on 05/21/2016 revealed an EF of 55%.  Echo on 07/21/2016 revealed an EF of 45-50%.  Bilateral lower extremity duplex on 05/30/2015 was negative.  Left upper extremity duplex on 06/08/2015 was negative.  Left lower extremity duplex on 07/19/2015 documented a DVT in the popliteal vein.   Left lower extremity duplex on 01/31/2016 revealed re-cannulization of the prior DVT involving the left popliteal vein.  She is on Xarelto.   She received 8 cycles of adriamycin (06/07/2015 - 12/06/2015) with Neulasta (On-Pro) support.  Cycle #3 was complicated by an acute  left lower extremity DVT.  Cycle #6 was postponed a week secondary to neutropenia ([place] 700).   Zinecard was added with cycle #7.  Olaratumab [name] was added with cycle #8.  Chest, abdomen, and pelvic CT scan on 08/07/2015 revealed a partial treatment response with decrease  anterior left upper pleural metastasis, stable to decreased pulmonary metastases and decreased right external iliac adenopathy. There were no new sites of disease.  Chest, abdomen, and pelvic CT scan on 10/22/2015 revealed mild decrease in left anterior chest wall mass/internal mammary lymphadenopathy (3.1 x 4.6 cm to 2.5 x 4.4 cm).  There was decreased mild right external iliac lymphadenopathy (1.4 cm to 1.2 cm).  There was diffuse stable bilateral pulmonary metastases (largest 1.1 cm in anterior right lung).  There was no new or progressive metastatic disease.  Chest CT on 12/25/2015 on revealed slight progression in pulmonary nodules (index nodules: 5 mm to 11 mm and 8 mm to 11 mm).  The dominant soft tissue lesion along the left hemi-thorax was 2.2 x 4.3 cm (stable).  She received 3 cycles of single agent olaratumab Drucilla Schmidt) (12/27/2015 -02/08/2016).  She required GCSF secondary to neutropenia.  PET scan on 02/26/2016 revealed progression of numerous pulmonary nodules/metastasis and mild progression (1.2 cm with SUV 7) of right pelvic nodal metastasis.  In the right lung apex, there was a 1.3 x 1.0 cm (SUV 6.2) previously 0.9 x 0.6 cm.  In the anterior left lung was a 2.0 x 2.2 cm (SUV 12.4) previously 1.1 x 0.9 cm.  In the anterior right middle lobe was a 1.1 x 1.1 cm (SUV 2.9), previously 0.9 x 0.9 cm.  The subpleural mass along the anterior of the left upper lobe was 2.1 x 3.5 cm (not FDG avid), previously 2.2 x 3.4 cm.  Bone scan on 03/05/2016 revealed no evidence of metastatic disease.  She received 5 cycles of trabectedin (Yondelis) (03/13/2016 - 07/02/2016) with Neulasta support.  She developed rhabdomyolysis with cycle #5 (CPK 5000).    Chest CT on 04/28/2016 revealed innumerable pulmonary metastases which  had slightly increased (74mm) in size since 02/26/2016.  PET scan on 08/01/2016 revealed that most of the pulmonary nodules were smaller and demonstrated decrease in metabolic activity. However, the largest lesion in the left upper lobe had increased in metabolic activity (SUV max increased from 12.4 to 17.4) but was fairly similar in size (2.6 cm now compared to 2.3 cm).  There was no mediastinal or hilar hypermetabolic adenopathy.  The right pelvic side was stable in size (SUV max decreased from 6.9 to 2.9).  There was no new abdominal or pelvic lymphadenopathy.  Chest, abdomen, and pelvic CT scan at [facility] on 09/10/2016 revealed multiple bilateral pulmonary metastasis some of which have increased in size while others have decreased. The bilobed left upper lobe mass extended to the anterior pleura and measured 4.2 x 3.3 cm and (previously 3.3 x 2.2 cm).  There was new small liver hypodensities concerning for early metastatic disease. There was stable mildly prominent right external iliac lymph nodes.  Anemia work-up on 07/28/2016 revealed the following normal labs: ferritin (529), iron saturation (26%), TIBC (287), B12, and folate.  Symptomatically, she has stiff hands in the morning.  Bilateral anterior thigh pain has resolved.    Plan: 1.  Review consult at [facility].  Discuss radiation.  As patient not symptomatic in left chest and disease is systemic, discuss treating systemically with chemotherapy.  Discuss pazopanib.  Discuss referral to [facility] for consideration of the phase II ALLIANCE study of pazopanib versus [id number]. 2.  Influenza vaccine today. 3.  Continue Xarelto. 4.  Contact Dr. Richardean Chimera office at [facility] for appt re: clinical trial 5.  RTC after appt with Dr. Waymon Amato.   Rosey Bath, MD 10/03/2016, 11:13 AM

## 2016-10-04 IMAGING — CT NM PET TUM IMG RESTAG (PS) SKULL BASE T - THIGH
1 of 10 series · 1 of 25 positions shown · non-contrast
Comparison: PET-CT 02/26/2016 and chest CT 04/28/2016

CLINICAL DATA: Subsequent treatment strategy for metastatic
leiomyosarcoma of the uterus..

EXAM:
NUCLEAR MEDICINE PET SKULL BASE TO THIGH
TECHNIQUE: 13.34 mCi F-18 FDG was injected intravenously. Full-ring PET imaging
was performed from the skull base to thigh after the radiotracer. CT
data was obtained and used for attenuation correction and anatomic
localization.
FASTING BLOOD GLUCOSE:  Value: 75 mg/dl

[Series 3: ct wb 5.0 b30f · axial · 5.0mm · 0.98mm/px · 1 of 290 slices shown]
[im 290/290  brain]
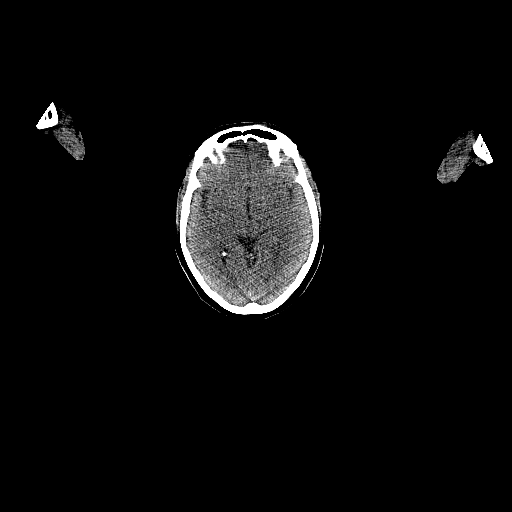

[1 of 25 positions shown; findings below may reference images not displayed]

FINDINGS: NECK

No hypermetabolic lymph nodes in the neck.

CHEST

The pleural and parenchymal left upper lobe lung mass anteriorly is
again demonstrated. The parenchymal portion measures 26 x 21 mm and
previously measured 23 x 21 mm. It is metabolically active with SUV
max of 17.4. This was 12.4 on the prior study.

Right apical nodule on the prior study measured 13 x 11 mm on image
number 33. It now measures 7 x 5 mm on image number 72. No
hypermetabolism.

Small right upper lobe pulmonary nodule on image number 80 measures
a maximum of 7 mm. It previously measured 8.5 mm. It is mildly
hypermetabolic with SUV max of 2.2.

Right upper lobe pulmonary nodule measured 12 x 12 mm on the prior
study and now measures 7 x 7 mm. No hypermetabolism.

8 mm lesions are noted in the right lower lobe on image number 96.
These now measure approximately 3 mm on image number 120.

No hypermetabolic mediastinal or hilar lymph nodes. No pleural
effusions or pleural nodules.

ABDOMEN/PELVIS

No abnormal hypermetabolic activity within the liver, pancreas,
adrenal glands, or spleen. No enlarged or hypermetabolic
retroperitoneal lymphadenopathy.

10 mm right pelvic sidewall lymph node is stable in size. SUV max is
2.9. It was previously 6.9.

SKELETON

No focal hypermetabolic activity to suggest skeletal metastasis.
IMPRESSION: 1. Interval improved CT appearance of the chest. Most of the
pulmonary nodules are smaller and demonstrate decrease in metabolic
activity. However, the largest lesion in the left upper lobe has
increased in metabolic activity but is fairly similar in size.
2. No mediastinal or hilar hypermetabolic adenopathy.
3. Right pelvic side which is stable in size but SUV max is
decreased from 6.982.9. No new abdominal or pelvic lymphadenopathy.

## 2016-10-07 ENCOUNTER — Telehealth: Payer: Self-pay | Admitting: *Deleted

## 2016-10-07 NOTE — Telephone Encounter (Signed)
Called Dr. Delfino Lovett Riedel's office to make appointment for patient.  Left voice message for a call back to schedule. Phone # [phone]

## 2016-10-14 ENCOUNTER — Inpatient Hospital Stay
Admission: RE | Admit: 2016-10-14 | Discharge: 2016-10-14 | Disposition: A | Discharge status: Home / Self Care | Payer: Self-pay | Source: Ambulatory Visit | Attending: Hematology and Oncology | Admitting: Hematology and Oncology

## 2016-10-14 ENCOUNTER — Inpatient Hospital Stay: Payer: 59

## 2016-10-14 ENCOUNTER — Other Ambulatory Visit: Payer: Self-pay | Admitting: Hematology and Oncology

## 2016-10-14 ENCOUNTER — Telehealth: Payer: Self-pay | Admitting: *Deleted

## 2016-10-14 ENCOUNTER — Ambulatory Visit
Admission: RE | Admit: 2016-10-14 | Discharge: 2016-10-14 | Disposition: A | Discharge status: Home / Self Care | Payer: Self-pay | Source: Ambulatory Visit | Attending: Hematology and Oncology | Admitting: Hematology and Oncology

## 2016-10-14 DIAGNOSIS — C55 Malignant neoplasm of uterus, part unspecified: Secondary | ICD-10-CM | POA: Diagnosis not present

## 2016-10-14 DIAGNOSIS — Z95828 Presence of other vascular implants and grafts: Secondary | ICD-10-CM

## 2016-10-14 MED ORDER — RIVAROXABAN 20 MG PO TABS: ORAL_TABLET | ORAL | 3 refills | Status: DC

## 2016-10-14 MED ORDER — HEPARIN SOD (PORK) LOCK FLUSH 100 UNIT/ML IV SOLN
500.0000 [IU] | Freq: Once | INTRAVENOUS | Status: AC
  Administered 2016-10-14: 500 [IU] via INTRAVENOUS

## 2016-10-14 MED ORDER — SODIUM CHLORIDE 0.9% FLUSH
10.0000 mL | INTRAVENOUS | Status: DC | PRN
  Administered 2016-10-14: 10 mL via INTRAVENOUS
  Filled 2016-10-14: qty 10

## 2016-10-14 NOTE — Progress Notes (Signed)
Survivorship Care Plan visit completed.  Treatment summary reviewed and given to patient.  ASCO answers booklet reviewed and given to patient.  CARE program and Cancer Transitions discussed with patient along with other resources cancer center offers to patients and caregivers.  Patient verbalized understanding.    

## 2016-10-14 NOTE — Telephone Encounter (Signed)
Called pt after I rcvd call from [place] with [facility] the new pt coordinator and her phone # is [phone].  She gave me an appt for the pt on 10/30/2016 at 11 am.  They already have the cd of images, she has already ordered info from [facility] and slides on her pathology.  The address for pt to come to for Dr. Angelina Ok is [address] [place], [place] [address]  At [address]. Gave pt all the info above and she is agreeable to the appt.  Also she needs refill of xarelto and I checked with Mike Gip and she states it is fine to refill. Refill sent electronically

## 2016-11-03 ENCOUNTER — Telehealth: Payer: Self-pay | Admitting: *Deleted

## 2016-11-03 NOTE — Telephone Encounter (Signed)
The clinical trial at [facility] was stopped and Dr Angelina Ok was to send notes to Dr Mike Gip regarding starting chemo, asking if we received info from [facility] and when she needs an appt scheduled for

## 2016-11-04 NOTE — Telephone Encounter (Signed)
Called and spoke with patient and told her Dr Mike Gip has Dr Ardelle Anton note and wants to research on drug he is suggesting and to discuss this with him. She has attempted calling him today, but was told he is unavailable. Mrs Brattain gave me a number for the Triage nurse to try to get him [phone] option [phone]

## 2016-11-06 NOTE — Telephone Encounter (Signed)
  Please call patient.  Message sent to Dr. Angelina Ok about drug choices. As soon as he and I speak, I will let her know.  M

## 2016-11-11 ENCOUNTER — Other Ambulatory Visit: Payer: Self-pay | Admitting: Hematology and Oncology

## 2016-11-11 NOTE — Progress Notes (Signed)
START OFF PATHWAY REGIMEN - Uterine  Off Pathway: Dacarbazine 1,000 mg/m2 q21 days  [id number]:Dacarbazine 1,000 mg/m2 q21 days:   A cycle is every 21 days:     Dacarbazine 1000 mg/m2 in 250 mL NS IV over 30 minutes Dose Mod: None  **Always confirm dose/schedule in your pharmacy ordering system**    Patient Characteristics: High Grade Undifferentiated/Leiomyosarcoma, Medically Inoperable, Third Line and Beyond Patient Status: Medically Inoperable Stage Grouping: IVB AJCC T Stage: X AJCC N Stage: X AJCC M Stage: X Line of therapy: Third Line and Beyond Would you be surprised if this patient died  in the next year? I would be surprised if this patient died in the next year  Intent of Therapy: Non-Curative / Palliative Intent, Discussed with Patient

## 2016-11-11 NOTE — Progress Notes (Signed)
Patient on plan of care prior to pathways. 

## 2016-11-13 ENCOUNTER — Telehealth: Payer: Self-pay | Admitting: *Deleted

## 2016-11-14 ENCOUNTER — Other Ambulatory Visit: Payer: Self-pay | Admitting: *Deleted

## 2016-11-17 ENCOUNTER — Other Ambulatory Visit: Payer: Self-pay | Admitting: *Deleted

## 2016-11-18 ENCOUNTER — Other Ambulatory Visit: Payer: Self-pay | Admitting: Hematology and Oncology

## 2016-11-24 ENCOUNTER — Telehealth: Payer: Self-pay | Admitting: *Deleted

## 2016-11-24 NOTE — Telephone Encounter (Signed)
I had spoke to her about the regimen that riedel ordered and dr Mike Gip still looking into it.  She wants another scan if it is been over a month since last time she had one. I would look into and let her know

## 2016-11-24 NOTE — Telephone Encounter (Signed)
Called pt and told her about scans and f/u appt with labs and see md and treatment.  All appts have been explained to pt and she is good with this.

## 2016-11-25 ENCOUNTER — Other Ambulatory Visit: Payer: Self-pay | Admitting: *Deleted

## 2016-11-25 ENCOUNTER — Ambulatory Visit
Admission: RE | Admit: 2016-11-25 | Discharge: 2016-11-25 | Disposition: A | Discharge status: Home / Self Care | Payer: 59 | Source: Ambulatory Visit | Attending: Hematology and Oncology | Admitting: Hematology and Oncology

## 2016-11-25 ENCOUNTER — Inpatient Hospital Stay: Payer: 59

## 2016-11-25 DIAGNOSIS — C55 Malignant neoplasm of uterus, part unspecified: Secondary | ICD-10-CM | POA: Diagnosis not present

## 2016-11-25 DIAGNOSIS — R160 Hepatomegaly, not elsewhere classified: Secondary | ICD-10-CM | POA: Diagnosis not present

## 2016-11-25 DIAGNOSIS — R918 Other nonspecific abnormal finding of lung field: Secondary | ICD-10-CM | POA: Diagnosis not present

## 2016-11-25 DIAGNOSIS — R590 Localized enlarged lymph nodes: Secondary | ICD-10-CM | POA: Diagnosis not present

## 2016-11-25 LAB — POCT I-STAT CREATININE: Creatinine, Ser: 0.7 mg/dL (ref 0.44–1.00)

## 2016-11-25 MED ORDER — IOPAMIDOL (ISOVUE-300) INJECTION 61%
100.0000 mL | Freq: Once | INTRAVENOUS | Status: AC | PRN
  Administered 2016-11-25: 100 mL via INTRAVENOUS

## 2016-11-26 ENCOUNTER — Other Ambulatory Visit: Payer: Self-pay | Admitting: Hematology and Oncology

## 2016-11-28 ENCOUNTER — Other Ambulatory Visit: Payer: Self-pay | Admitting: Hematology and Oncology

## 2016-11-28 ENCOUNTER — Inpatient Hospital Stay: Payer: 59 | Attending: Hematology and Oncology

## 2016-11-28 ENCOUNTER — Other Ambulatory Visit: Payer: Self-pay | Admitting: *Deleted

## 2016-11-28 ENCOUNTER — Encounter: Payer: Self-pay | Admitting: Hematology and Oncology

## 2016-11-28 ENCOUNTER — Inpatient Hospital Stay: Payer: 59

## 2016-11-28 ENCOUNTER — Inpatient Hospital Stay (HOSPITAL_BASED_OUTPATIENT_CLINIC_OR_DEPARTMENT_OTHER): Payer: 59 | Admitting: Hematology and Oncology

## 2016-11-28 VITALS — BP 116/80 | HR 84 | Temp 96.2°F | Resp 18 | Wt 152.6 lb

## 2016-11-28 DIAGNOSIS — Z5111 Encounter for antineoplastic chemotherapy: Secondary | ICD-10-CM | POA: Insufficient documentation

## 2016-11-28 DIAGNOSIS — C787 Secondary malignant neoplasm of liver and intrahepatic bile duct: Secondary | ICD-10-CM

## 2016-11-28 DIAGNOSIS — C7801 Secondary malignant neoplasm of right lung: Secondary | ICD-10-CM | POA: Diagnosis not present

## 2016-11-28 DIAGNOSIS — C7800 Secondary malignant neoplasm of unspecified lung: Secondary | ICD-10-CM

## 2016-11-28 DIAGNOSIS — C7802 Secondary malignant neoplasm of left lung: Secondary | ICD-10-CM | POA: Diagnosis not present

## 2016-11-28 DIAGNOSIS — Z7689 Persons encountering health services in other specified circumstances: Secondary | ICD-10-CM | POA: Insufficient documentation

## 2016-11-28 DIAGNOSIS — Z79899 Other long term (current) drug therapy: Secondary | ICD-10-CM | POA: Insufficient documentation

## 2016-11-28 DIAGNOSIS — Z86718 Personal history of other venous thrombosis and embolism: Secondary | ICD-10-CM | POA: Insufficient documentation

## 2016-11-28 DIAGNOSIS — C55 Malignant neoplasm of uterus, part unspecified: Secondary | ICD-10-CM

## 2016-11-28 DIAGNOSIS — Z7901 Long term (current) use of anticoagulants: Secondary | ICD-10-CM

## 2016-11-28 DIAGNOSIS — E876 Hypokalemia: Secondary | ICD-10-CM

## 2016-11-28 DIAGNOSIS — C775 Secondary and unspecified malignant neoplasm of intrapelvic lymph nodes: Secondary | ICD-10-CM | POA: Insufficient documentation

## 2016-11-28 DIAGNOSIS — M6282 Rhabdomyolysis: Secondary | ICD-10-CM | POA: Insufficient documentation

## 2016-11-28 DIAGNOSIS — C782 Secondary malignant neoplasm of pleura: Secondary | ICD-10-CM | POA: Insufficient documentation

## 2016-11-28 DIAGNOSIS — J069 Acute upper respiratory infection, unspecified: Secondary | ICD-10-CM | POA: Diagnosis not present

## 2016-11-28 LAB — CBC WITH DIFFERENTIAL/PLATELET
Basophils Absolute: 0 10*3/uL (ref 0–0.1)
Basophils Relative: 2 %
Eosinophils Absolute: 0.1 10*3/uL (ref 0–0.7)
Eosinophils Relative: 5 %
HCT: 37 % (ref 35.0–47.0)
Hemoglobin: 12.5 g/dL (ref 12.0–16.0)
Lymphocytes Relative: 34 %
Lymphs Abs: 0.7 10*3/uL — ABNORMAL LOW (ref 1.0–3.6)
MCH: 30.3 pg (ref 26.0–34.0)
MCHC: 33.9 g/dL (ref 32.0–36.0)
MCV: 89.4 fL (ref 80.0–100.0)
Monocytes Absolute: 0.3 10*3/uL (ref 0.2–0.9)
Monocytes Relative: 13 %
Neutro Abs: 1 10*3/uL — ABNORMAL LOW (ref 1.4–6.5)
Neutrophils Relative %: 46 %
Platelets: 185 10*3/uL (ref 150–440)
RBC: 4.14 MIL/uL (ref 3.80–5.20)
RDW: 14.9 % — ABNORMAL HIGH (ref 11.5–14.5)
WBC: 2.1 10*3/uL — ABNORMAL LOW (ref 3.6–11.0)

## 2016-11-28 LAB — COMPREHENSIVE METABOLIC PANEL
ALT: 44 U/L (ref 14–54)
AST: 51 U/L — ABNORMAL HIGH (ref 15–41)
Albumin: 3.8 g/dL (ref 3.5–5.0)
Alkaline Phosphatase: 155 U/L — ABNORMAL HIGH (ref 38–126)
Anion gap: 7 (ref 5–15)
BUN: 13 mg/dL (ref 6–20)
CO2: 25 mmol/L (ref 22–32)
Calcium: 9.5 mg/dL (ref 8.9–10.3)
Chloride: 102 mmol/L (ref 101–111)
Creatinine, Ser: 0.67 mg/dL (ref 0.44–1.00)
GFR calc Af Amer: >60 mL/min (ref >60)
GFR calc non Af Amer: >60 mL/min (ref >60)
Glucose, Bld: 125 mg/dL — ABNORMAL HIGH (ref 65–99)
Potassium: 3.3 mmol/L — ABNORMAL LOW (ref 3.5–5.1)
Sodium: 134 mmol/L — ABNORMAL LOW (ref 135–145)
Total Bilirubin: 0.8 mg/dL (ref 0.3–1.2)
Total Protein: 7.7 g/dL (ref 6.5–8.1)

## 2016-11-28 MED ORDER — PALONOSETRON HCL INJECTION 0.25 MG/5ML
0.2500 mg | Freq: Once | INTRAVENOUS | Status: AC
  Administered 2016-11-28: 0.25 mg via INTRAVENOUS
  Filled 2016-11-28: qty 5

## 2016-11-28 MED ORDER — POTASSIUM CHLORIDE ER 10 MEQ PO TBCR: 10.0000 meq | EXTENDED_RELEASE_TABLET | Freq: Two times a day (BID) | ORAL | 0 refills | Status: DC

## 2016-11-28 MED ORDER — SODIUM CHLORIDE 0.9 % IV SOLN
Freq: Once | INTRAVENOUS | Status: AC
  Administered 2016-11-28: 11:00:00 via INTRAVENOUS
  Filled 2016-11-28: qty 5

## 2016-11-28 MED ORDER — SODIUM CHLORIDE 0.9% FLUSH
10.0000 mL | INTRAVENOUS | Status: DC | PRN
  Administered 2016-11-28: 10 mL
  Filled 2016-11-28: qty 10

## 2016-11-28 MED ORDER — DEXAMETHASONE 4 MG PO TABS: ORAL_TABLET | ORAL | 1 refills | Status: DC

## 2016-11-28 MED ORDER — SODIUM CHLORIDE 0.9 % IV SOLN
Freq: Once | INTRAVENOUS | Status: AC
  Administered 2016-11-28: 11:00:00 via INTRAVENOUS
  Filled 2016-11-28: qty 1000

## 2016-11-28 MED ORDER — PEGFILGRASTIM 6 MG/0.6ML ~~LOC~~ PSKT
6.0000 mg | PREFILLED_SYRINGE | Freq: Once | SUBCUTANEOUS | Status: AC
  Administered 2016-11-28: 6 mg via SUBCUTANEOUS
  Filled 2016-11-28: qty 0.6

## 2016-11-28 MED ORDER — SODIUM CHLORIDE 0.9 % IV SOLN
1000.0000 mg/m2 | Freq: Once | INTRAVENOUS | Status: AC
  Administered 2016-11-28: 1800 mg via INTRAVENOUS
  Filled 2016-11-28: qty 90

## 2016-11-28 MED ORDER — HEPARIN SOD (PORK) LOCK FLUSH 100 UNIT/ML IV SOLN
500.0000 [IU] | Freq: Once | INTRAVENOUS | Status: AC | PRN
Start: 2016-11-28 — End: 2016-11-28
  Administered 2016-11-28: 500 [IU]
  Filled 2016-11-28 (×2): qty 5

## 2016-11-28 MED ORDER — PROCHLORPERAZINE MALEATE 10 MG PO TABS: 10.0000 mg | ORAL_TABLET | Freq: Four times a day (QID) | ORAL | 1 refills | Status: DC | PRN

## 2016-11-28 NOTE — Progress Notes (Signed)
Reviewed dosage of dacarbazine with MD.  To start on dacarbazine 1000mg /m2, to possibly increase to 1200mg /m2 if tolerated.    Also noted: pharmacy label states 200mg  to be diluted in 9.49ml.  Package insert states 19.16ml for total concentration of 10mg /ml.  Put in IT ticket to change label to reflect this.

## 2016-11-28 NOTE — Progress Notes (Addendum)
[facility] day:  11/28/16  Chief Complaint: Cassidy Bradley is an 55 y.o. female with metastatic uterine leiomyosarcoma who is seen for assessment prior to cycle #1 dacarbazine.  HPI: The patient was last seen in the medical oncology clinic on 10/03/2016.  At that time, we discussed her second opinion at [facility].  Recommendation was for left chest wall radiation followed by pazopanib.  As she was not symptomatic, we discussed chemotherapy options.  We discussed referral to Dr. Angelina Ok at [facility] for clinical trial enrollment.  She seen by Dr. Angelina Ok on 10/30/2016.  Recommendation was for no radiation.  The phase II [employer] study of pazopanib versus [id number] was temporary closed to accrual.  Discussions were held regarding alternative systemic therapy, dacarbazine every 21 days.  If and when she progresses, the trial may be an option in the future.  She underwent chest, abdomen, and pelvic CT scan on 11/25/2016.  There was interval increase in the size of the dominant mass in the left upper lobe (4.2 x 2.9 cm). There was interval increase in size of innumerable bilateral pulmonary nodules. There was interval increase in the size of the right hilar lymph node (1.5 cm). There was interval increase in the size of hepatic hypodensities (nodules 8-9 mm) highly concerning for hepatic metastasis. There was a stable small pelvic sidewall lesion (1.1 cm).  Symptomatically, she notes no pain since her CT scan.  She feels good.   Past Medical History:  Diagnosis Date  . Breast lump 2006    both breast   . Cancer ([place])    Uterine Leiomyosarcoma  . Chest wall mass   . DVT (deep venous thrombosis) ([facility])    Left leg  . Uterine cancer ([place]) 01/2015   chemo    Past Surgical History:  Procedure Laterality Date  . ABDOMINAL HYSTERECTOMY  01/30/15  . ANKLE SURGERY  2005  . BACK SURGERY  2005  . BREAST BIOPSY Bilateral 10 + yrs ago   neg  . CESAREAN SECTION   1993  . CHOLECYSTECTOMY  2001  . COLONOSCOPY     Family History  Problem Relation Age of Onset  . Colon polyps Mother   . Lupus Father   . Esophageal cancer Maternal Uncle   . Breast cancer Sister 61    fraternal twin sister  . Breast cancer Maternal Aunt   . Breast cancer Maternal Grandmother 60  . Stomach cancer Maternal Aunt   . Lung cancer Maternal Grandfather   . Cancer - Colon Cousin 27    first cousin    Social History:  reports that she has never smoked. She has never used smokeless tobacco. She reports that she does not drink alcohol or use drugs.   The patient is accompanied by her husband today.  Allergies:  Allergies  Allergen Reactions  . Pravastatin Hives and Nausea And Vomiting  . Sulfa Antibiotics Hives and Swelling  . Latex Rash  . Other Rash and Other (See Comments)    Pt states that she is allergic to spandex.   Current Medications: Current Outpatient Prescriptions  Medication Sig Dispense Refill  . dexamethasone (DECADRON) 4 MG tablet Take 2 tablets by mouth once a day on the day after chemotherapy and then take 2 tablets two times a day for 2 days. Take with food. 30 tablet 1  . lidocaine-prilocaine (EMLA) cream Apply 1 application topically as needed. Apply one hour prior to chemotherapy (Patient taking differently: Apply  1 application topically as needed (prior to accessing pts port). Pt applies one hour prior to chemotherapy.) 30 g 1  . Multiple Vitamins-Minerals (ALIVE WOMENS ENERGY) TABS Take 1 tablet by mouth daily.     . ondansetron (ZOFRAN) 8 MG tablet Take 8 mg by mouth every 8 (eight) hours as needed for nausea or vomiting. Reported on 06/26/2016    . polyethylene glycol powder (GLYCOLAX/MIRALAX) powder Take 1 Container by mouth daily as needed. Reported on 06/26/2016    . rivaroxaban (XARELTO) 20 MG TABS tablet Take 1 tablet by mouth  daily with supper 90 tablet 3  . ferrous sulfate 325 (65 FE) MG tablet Take 1 tablet (325 mg total) by mouth 2  (two) times daily with a meal. (Patient not taking: Reported on 11/28/2016) 60 tablet 0  . loratadine (CLARITIN) 10 MG tablet Take 10 mg by mouth daily. Reported on 07/02/2016    . prochlorperazine (COMPAZINE) 10 MG tablet Take 1 tablet (10 mg total) by mouth every 6 (six) hours as needed (Nausea or vomiting). (Patient not taking: Reported on 11/28/2016) 30 tablet 1   No current facility-administered medications for this visit.    Facility-Administered Medications Ordered in Other Visits  Medication Dose Route Frequency Provider Last Rate Last Dose  . 0.9 %  sodium chloride infusion   Intravenous Continuous Lequita Asal, MD   Stopped at 05/01/16 1340  . 0.9 %  sodium chloride infusion   Intravenous Continuous Lequita Asal, MD   Stopped at 06/05/16 1235  . heparin lock flush 100 unit/mL  500 Units Intracatheter Once PRN Lequita Asal, MD      . heparin lock flush 100 unit/mL  500 Units Intracatheter Once PRN Lequita Asal, MD      . heparin lock flush 100 unit/mL  500 Units Intracatheter Once PRN Lequita Asal, MD      . heparin lock flush 100 unit/mL  500 Units Intracatheter Once PRN Lequita Asal, MD      . sodium chloride 0.9 % injection 10 mL  10 mL Intracatheter PRN Lequita Asal, MD      . sodium chloride flush (NS) 0.9 % injection 10 mL  10 mL Intracatheter PRN Lequita Asal, MD   10 mL at 04/03/16 1241  . sodium chloride flush (NS) 0.9 % injection 10 mL  10 mL Intracatheter PRN Lequita Asal, MD   10 mL at 05/01/16 1225  . sodium chloride flush (NS) 0.9 % injection 10 mL  10 mL Intracatheter PRN Lequita Asal, MD   10 mL at 06/05/16 1135  . sodium chloride flush (NS) 0.9 % injection 10 mL  10 mL Intracatheter PRN Lequita Asal, MD   10 mL at 07/02/16 1050  . sodium chloride flush (NS) 0.9 % injection 10 mL  10 mL Intravenous PRN Lequita Asal, MD   10 mL at 08/29/16 1449    Review of Systems:  GENERAL:  Feels good.  No fevers or  sweats. Weight down 3 pounds. PERFORMANCE STATUS (ECOG):  2 HEENT:  No visual changes,sore throat, mouth sores or tenderness. Lungs:  No shortness of breath.  No cough.  No hemoptysis. Cardiac:  No chest pain, palpitations, orthopnea, or PND. GI:   No nausea, vomiting, constipation, melena, or hematochezia. GU:  No urgency, frequency, dysuria, or hematuria. Musculoskeletal:  No bone or back pain.  No joint pain.  Muscle tenderness in anterior thighs, improved. Extremities: Left lower extremity swelling,  stable.  No pain. Skin:  No rashes or skin changes. Neuro:  No headache, numbness or weakness, balance or coordination issues. Endocrine:  No diabetes, thyroid issues, hot flashes or night sweats. Psych:  No mood changes, depression or anxiety. Pain:  Pain resolved after CT scan. Review of systems:  All other systems reviewed and found to be negative.                        Physical Exam: Blood pressure 116/80, pulse 84, temperature (!) 96.2 F (35.7 C), temperature source Tympanic, resp. rate 18, weight 152 lb 8.9 oz (69.2 kg).  GENERAL:  Well developed, well nourished, woman sitting comfortably in the exam room in no acute distress. MENTAL STATUS:  Alert and oriented to person, place and time. HEAD:  Curly gray hair.  Normocephalic, atraumatic, face symmetric, no Cushingoid features. EYES:  Glasses.  Brown eyes.  Pupils equal round and reactive to light and accomodation.  No conjunctivitis or scleral icterus. ENT:  Oropharynx clear without lesion.  Tongue normal. Mucous membranes moist.  RESPIRATORY:  Clear to auscultation without rales, wheezes or rhonchi. CARDIOVASCULAR:  Regular rate and rhythm without murmur, rub or gallop. No JVD. ABDOMEN:  Soft, non-tender, with active bowel sounds, and no hepatosplenomegaly.  No masses. SKIN:  No rashes, ulcers or lesions. EXTREMITIES:  1+ left lower extremity edema below the knee (stable).  LYMPH NODES:  No palpable cervical, supraclavicular,  axillary or inguinal adenopathy  NEUROLOGICAL: Appropriate. PSYCH:  Appropriate.  Laboratory testing: [facility] labs on 02/28/2016 included a hematocrit 36.1, hemoglobin 12.1, platelets 206,000, white count 13,200 with an [place] of 11,400.  Comprehensive metabolic panel included a creatinine of 0.67. Liver function tests included an alkaline phosphatase of 122 (39-117).    [facility] labs on 03/12/2016 included a comprehensive metabolic panel included a creatinine of 0.57.  Liver function tests included an alkaline phosphatase of 118 (39-117) post Neupogen.  Phosphorus was 3.4.  Creatinine kinase was 156 (24-173).  [facility] labs on 04/02/2016 included hematocrit of 33.3, hemoglobin 11.0, platelets 229,000, WBC 2700 with an ANC of 1400.  Comprehensive metabolic panel included a creatinine of 0.61.  Liver function tests included an alkaline phosphatase of 129 (39-117), AST 54 (0-40) and ALT 76 (0-32).  Phosphorus was 3.9.  Creatinine kinase was 120 (24-173).  [facility] labs on 04/23/2016 included hematocrit of 30.4, hemoglobin 10.0, platelets 266,000, WBC 3200 with an ANC of 1600.  Comprehensive metabolic panel included a creatinine of 0.68.  Liver function tests included an alkaline phosphatase of 195 (39-117), AST 45 (0-40) and ALT 69 (0-32).  Phosphorus was 3.2.  Creatinine kinase was 258 (24-173).  [facility] labs on 04/30/2016 included hematocrit of 32.0, hemoglobin 10.8, platelets 287,000, WBC 2100 with an ANC of 900.  Comprehensive metabolic panel included a creatinine of 0.68.  Liver function tests included an alkaline phosphatase of 166 (39-117), AST 42 (0-40) and ALT 44 (0-32).  Phosphorus was 4.0.  Creatinine kinase was 182 (24-173).  [facility] labs on 05/21/2016 included hematocrit of 28.9, hemoglobin 9.4, platelets 167,000, WBC 2500 with an ANC of 1,100.  Comprehensive metabolic panel included a creatinine of 0.68.  Liver function tests included an alkaline phosphatase of 244 (39-117), AST 63 (0-40) and  ALT 86 (0-32).  Phosphorus was 4.0.  Creatinine kinase was 433 (24-173).  [facility] labs on 06/04/2016 included hematocrit of 32.7, hemoglobin 10.6, platelets 287,000, WBC 2800 with an ANC of 1,300. Comprehensive metabolic panel included a creatinine of 0.68.  Liver function tests included an alkaline phosphatase of 241 (39-117), AST 64 (0-40) and ALT 67 (0-32). Phosphorus was 4.0. Creatinine kinase was 229 (24-173).  [facility] labs on 06/25/2016 included hematocrit of 28.1, hemoglobin 9.3, platelets 155,000, WBC 2500 with an ANC of 1,000.  Comprehensive metabolic panel included a creatinine of 0.62.  Liver function tests included an alkaline phosphatase of 240 (39-117), AST 72 (0-40) and ALT 80 (0-32).  Phosphorus was 3.6.  Creatinine kinase was 8.6 (0-5.3).   Appointment on 11/28/2016  Component Date Value Ref Range Status  . Sodium 11/28/2016 134* 135 - 145 mmol/L Final  . Potassium 11/28/2016 3.3* 3.5 - 5.1 mmol/L Final  . Chloride 11/28/2016 102  101 - 111 mmol/L Final  . CO2 11/28/2016 25  22 - 32 mmol/L Final  . Glucose, Bld 11/28/2016 125* 65 - 99 mg/dL Final  . BUN 11/28/2016 13  6 - 20 mg/dL Final  . Creatinine, Ser 11/28/2016 0.67  0.44 - 1.00 mg/dL Final  . Calcium 11/28/2016 9.5  8.9 - 10.3 mg/dL Final  . Total Protein 11/28/2016 7.7  6.5 - 8.1 g/dL Final  . Albumin 11/28/2016 3.8  3.5 - 5.0 g/dL Final  . AST 11/28/2016 51* 15 - 41 U/L Final  . ALT 11/28/2016 44  14 - 54 U/L Final  . Alkaline Phosphatase 11/28/2016 155* 38 - 126 U/L Final  . Total Bilirubin 11/28/2016 0.8  0.3 - 1.2 mg/dL Final  . GFR calc non Af Amer 11/28/2016 >60  >60 mL/min Final  . GFR calc Af Amer 11/28/2016 >60  >60 mL/min Final   Comment: (NOTE) The eGFR has been calculated using the CKD EPI equation. This calculation has not been validated in all clinical situations. eGFR's persistently <60 mL/min signify possible Chronic Kidney Disease.   . Anion gap 11/28/2016 7  5 - 15 Final  . WBC 11/28/2016  2.1* 3.6 - 11.0 K/uL Final  . RBC 11/28/2016 4.14  3.80 - 5.20 MIL/uL Final  . Hemoglobin 11/28/2016 12.5  12.0 - 16.0 g/dL Final  . HCT 11/28/2016 37.0  35.0 - 47.0 % Final  . MCV 11/28/2016 89.4  80.0 - 100.0 fL Final  . MCH 11/28/2016 30.3  26.0 - 34.0 pg Final  . MCHC 11/28/2016 33.9  32.0 - 36.0 g/dL Final  . RDW 11/28/2016 14.9* 11.5 - 14.5 % Final  . Platelets 11/28/2016 185  150 - 440 K/uL Final  . Neutrophils Relative % 11/28/2016 46  % Final  . Neutro Abs 11/28/2016 1.0* 1.4 - 6.5 K/uL Final  . Lymphocytes Relative 11/28/2016 34  % Final  . Lymphs Abs 11/28/2016 0.7* 1.0 - 3.6 K/uL Final  . Monocytes Relative 11/28/2016 13  % Final  . Monocytes Absolute 11/28/2016 0.3  0.2 - 0.9 K/uL Final  . Eosinophils Relative 11/28/2016 5  % Final  . Eosinophils Absolute 11/28/2016 0.1  0 - 0.7 K/uL Final  . Basophils Relative 11/28/2016 2  % Final  . Basophils Absolute 11/28/2016 0.0  0 - 0.1 K/uL Final   Assessment:  Cassidy Bradley is a 55 y.o. female 55 y.o. female with stage IV uterine leiomyosarcoma. She underwent TAH/BSO on 01/30/2015. Pathology confirmed high grade leiomyosarcoma with 2 large adjacent tumor nodules (10 cm and 5 cm) and a separate 0.5 cm serosal nodule.   Chest, abdomen, and pelvic CT scan on 02/12/2015 revealed a 1.7 x 2.3 cm right external iliac node. Pulmonary nodules were scattered throughout the lungs bilaterally. There was left internal  mammary chain adenopathy measuring up to 3.6 x 2 cm.  She underwent anterior chest wall pleural-based CT guided biopsy on 02/21/2015. Pathology was consistent with metastatic leiomyosarcoma. Bone scan on 02/28/2015 revealed no evidence of metastatic disease with indeterminate uptake in the left posterior rib.   She has a fraternal twin who developed breast cancer at age 8. Several other family members have malignancies. 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  Prechemotherapy labs from [facility] revealed leukopenia (WBC 2300 with ANC 900). Work-up to date is negative. She has a family history of leukopenia. Bone marrow on 03/08/2015 revealed no evidence of neoplasia or metastatic disease. Marrow was normocellular for age (30-50%) with trilineage hematopoiesis. There was no increase in marrow reticulin fibers. Flow cytometry and cytogenetics were normal.  She received 4 cycles of gemcitabine and Taxotere (03/20/2015 - 05/22/2015) with Neulasta support. She received gemcitabine alone on 05/22/2015.  She was admitted on 05/24/2015 with an acute febrile illness. All cultures were negative.   Chest CT on 05/25/2015 revealed early progressive disease. Abdominal and pelvic CT scan on 05/30/2015 revealed stable to slightly increased right external iliac lymph node (1.8 x 2.5 cm).   Echocardiogram on 06/18/2015 revealed EF of 60-65%.  Echo on 10/22/2015 revealed an EF of 65%.  MUGA on 12/25/2015 revealed an EF of 52%.  Echo on 03/03/2016 revealed an EF of 50%.  Echo on 05/21/2016 revealed an EF of 55%.  Echo on 07/21/2016 revealed an EF of 45-50%.  Bilateral lower extremity duplex on 05/30/2015 was negative.  Left upper extremity duplex on 06/08/2015 was negative.  Left lower extremity duplex on 07/19/2015 documented a DVT in the popliteal vein.   Left lower extremity duplex on 01/31/2016 revealed re-cannulization of the prior DVT involving the left popliteal vein.  She is on Xarelto.   She received 8 cycles of adriamycin (06/07/2015 - 12/06/2015) with Neulasta (On-Pro) support.  Cycle #3 was complicated by an acute  left lower extremity DVT.  Cycle #6 was postponed a week secondary to neutropenia (Ravalli 700).  Zinecard was added with cycle #7.  Olaratumab [name] was added with cycle #8.  Chest, abdomen, and pelvic CT scan on 08/07/2015 revealed a partial treatment response with decrease anterior left upper pleural metastasis, stable to  decreased pulmonary metastases and decreased right external iliac adenopathy. There were no new sites of disease.  Chest, abdomen, and pelvic CT scan on 10/22/2015 revealed mild decrease in left anterior chest wall mass/internal mammary lymphadenopathy (3.1 x 4.6 cm to 2.5 x 4.4 cm).  There was decreased mild right external iliac lymphadenopathy (1.4 cm to 1.2 cm).  There was diffuse stable bilateral pulmonary metastases (largest 1.1 cm in anterior right lung).  There was no new or progressive metastatic disease.  Chest CT on 12/25/2015 on revealed slight progression in pulmonary nodules (index nodules: 5 mm to 11 mm and 8 mm to 11 mm).  The dominant soft tissue lesion along the left hemi-thorax was 2.2 x 4.3 cm (stable).  She received 3 cycles of single agent olaratumab [name] (12/27/2015 -02/08/2016).  She required GCSF secondary to neutropenia.  PET scan on 02/26/2016 revealed progression of numerous pulmonary nodules/metastasis and mild progression (1.2 cm with SUV 7) of right pelvic nodal metastasis.  In the right lung apex, there was a 1.3 x 1.0 cm (SUV 6.2) previously 0.9 x 0.6 cm.  In the anterior left lung was a 2.0 x 2.2 cm (SUV 12.4) previously 1.1 x  0.9 cm.  In the anterior right middle lobe was a 1.1 x 1.1 cm (SUV 2.9), previously 0.9 x 0.9 cm.  The subpleural mass along the anterior of the left upper lobe was 2.1 x 3.5 cm (not FDG avid), previously 2.2 x 3.4 cm.  Bone scan on 03/05/2016 revealed no evidence of metastatic disease.  She received 5 cycles of trabectedin (Yondelis) (03/13/2016 - 07/02/2016) with Neulasta support.  She developed rhabdomyolysis with cycle #5 (CPK 5000).    Chest CT on 04/28/2016 revealed innumerable pulmonary metastases which had slightly increased (10m) in size since 02/26/2016.  PET scan on 08/01/2016 revealed that most of the pulmonary nodules were smaller and demonstrated decrease in metabolic activity. However, the largest lesion in the left upper lobe  had increased in metabolic activity (SUV max increased from 12.4 to 17.4) but was fairly similar in size (2.6 cm now compared to 2.3 cm).  There was no mediastinal or hilar hypermetabolic adenopathy.  The right pelvic side wais stable in size (SUV max decreased from 6.9 to 2.9).  There was no new abdominal or pelvic lymphadenopathy.  Chest, abdomen, and pelvic CT scan on 11/25/2016 revealed an interval increase in the size of the dominant mass in the left upper lobe (4.2 x 2.9 cm). There was interval increase in size of innumerable bilateral pulmonary nodules. There was interval increase in the size of the right hilar lymph node (1.5 cm). There was interval increase in the size of hepatic hypodensities (nodules 8-9 mm) highly concerning for hepatic metastasis. There was a stable small pelvic sidewall lesion (1.1 cm).  Anemia work-up on 07/28/2016 revealed the following normal labs: ferritin (529), iron saturation (26%), TIBC (287), B12, and folate.  Symptomatically, she feels good.  Exam is stable. She has mild hypokalemia (3.3).  Plan: 1.  Labs today:  CBC with diff, CMP. 2.  Discuss plan for dacarbazine every 3 weeks.  Discuss dose range 850 mg/m2 - 1200 mg/m2.  Discuss cycle #1 at 1000 mg/m2.  Discuss baseline low counts and need for Neulasta.  Discuss side effects associated with chemotherapy.  Discuss anti-emetic regimen (Decadron 8 mg on day after chemotherapy then 8 mg BID on day 3 and day 4).  Discuss ondansetron every 8 hours as needed after day 2.  Discuss consideration of Compazine 10 mg po q 6 hours if needed. Discuss response rates (low) with goal of stable disease.  Patient consented to treatment. 3.  Cycle #1 dacarbazine with OnPro Neulasta today. 4.  Potassium chloride 10 meq po BID x 2 days; dis: #20. 5.  RTC on 12/08/2016 for MD assessment and labs (CBC with diff, BMP). 5.  RTC on 12/25/2016 for MD assessment, labs (CBC with diff, CMP, Mg), and cycle #2 dacarbazine.   MLequita Asal MD 11/28/2016, 10:17 AM

## 2016-11-28 NOTE — Progress Notes (Signed)
Patient has epigastric pain when lying down that is 4/10 on pain scale.

## 2016-12-01 ENCOUNTER — Ambulatory Visit: Payer: 59

## 2016-12-07 ENCOUNTER — Encounter: Payer: Self-pay | Admitting: Hematology and Oncology

## 2016-12-07 DIAGNOSIS — E876 Hypokalemia: Secondary | ICD-10-CM | POA: Insufficient documentation

## 2016-12-08 ENCOUNTER — Inpatient Hospital Stay (HOSPITAL_BASED_OUTPATIENT_CLINIC_OR_DEPARTMENT_OTHER): Payer: 59 | Admitting: Hematology and Oncology

## 2016-12-08 ENCOUNTER — Other Ambulatory Visit: Payer: Self-pay

## 2016-12-08 ENCOUNTER — Inpatient Hospital Stay: Payer: 59

## 2016-12-08 VITALS — BP 115/82 | HR 101 | Temp 98.0°F | Resp 18 | Wt 148.8 lb

## 2016-12-08 DIAGNOSIS — C782 Secondary malignant neoplasm of pleura: Secondary | ICD-10-CM

## 2016-12-08 DIAGNOSIS — C55 Malignant neoplasm of uterus, part unspecified: Secondary | ICD-10-CM

## 2016-12-08 DIAGNOSIS — C775 Secondary and unspecified malignant neoplasm of intrapelvic lymph nodes: Secondary | ICD-10-CM

## 2016-12-08 DIAGNOSIS — C7801 Secondary malignant neoplasm of right lung: Secondary | ICD-10-CM | POA: Diagnosis not present

## 2016-12-08 DIAGNOSIS — C7802 Secondary malignant neoplasm of left lung: Secondary | ICD-10-CM

## 2016-12-08 DIAGNOSIS — Z86718 Personal history of other venous thrombosis and embolism: Secondary | ICD-10-CM

## 2016-12-08 DIAGNOSIS — Z7901 Long term (current) use of anticoagulants: Secondary | ICD-10-CM

## 2016-12-08 DIAGNOSIS — J069 Acute upper respiratory infection, unspecified: Secondary | ICD-10-CM

## 2016-12-08 DIAGNOSIS — M6282 Rhabdomyolysis: Secondary | ICD-10-CM

## 2016-12-08 DIAGNOSIS — E876 Hypokalemia: Secondary | ICD-10-CM

## 2016-12-08 DIAGNOSIS — C7800 Secondary malignant neoplasm of unspecified lung: Secondary | ICD-10-CM

## 2016-12-08 DIAGNOSIS — C787 Secondary malignant neoplasm of liver and intrahepatic bile duct: Secondary | ICD-10-CM

## 2016-12-08 DIAGNOSIS — Z79899 Other long term (current) drug therapy: Secondary | ICD-10-CM

## 2016-12-08 LAB — CBC WITH DIFFERENTIAL/PLATELET
Basophils Absolute: 0 10*3/uL (ref 0–0.1)
Basophils Relative: 0 %
Eosinophils Absolute: 0.1 10*3/uL (ref 0–0.7)
Eosinophils Relative: 1 %
HCT: 39.7 % (ref 35.0–47.0)
Hemoglobin: 13.4 g/dL (ref 12.0–16.0)
Lymphocytes Relative: 12 %
Lymphs Abs: 0.7 10*3/uL — ABNORMAL LOW (ref 1.0–3.6)
MCH: 30.2 pg (ref 26.0–34.0)
MCHC: 33.8 g/dL (ref 32.0–36.0)
MCV: 89.4 fL (ref 80.0–100.0)
Monocytes Absolute: 0.5 10*3/uL (ref 0.2–0.9)
Monocytes Relative: 8 %
Neutro Abs: 4.6 10*3/uL (ref 1.4–6.5)
Neutrophils Relative %: 79 %
Platelets: 156 10*3/uL (ref 150–440)
RBC: 4.44 MIL/uL (ref 3.80–5.20)
RDW: 15.4 % — ABNORMAL HIGH (ref 11.5–14.5)
WBC: 5.9 10*3/uL (ref 3.6–11.0)

## 2016-12-08 LAB — BASIC METABOLIC PANEL
Anion gap: 7 (ref 5–15)
BUN: 14 mg/dL (ref 6–20)
CO2: 28 mmol/L (ref 22–32)
Calcium: 9.4 mg/dL (ref 8.9–10.3)
Chloride: 100 mmol/L — ABNORMAL LOW (ref 101–111)
Creatinine, Ser: 0.58 mg/dL (ref 0.44–1.00)
GFR calc Af Amer: >60 mL/min (ref >60)
GFR calc non Af Amer: >60 mL/min (ref >60)
Glucose, Bld: 95 mg/dL (ref 65–99)
Potassium: 4 mmol/L (ref 3.5–5.1)
Sodium: 135 mmol/L (ref 135–145)

## 2016-12-08 NOTE — Progress Notes (Signed)
Patient concerned today about post nasal drainage, sore throat, cough and congestion.  Patient states this all started over the weekend. Her husband has pneumonia.  Patient states she is doing well.  No n/v.  Appetite good.

## 2016-12-09 ENCOUNTER — Telehealth: Payer: Self-pay | Admitting: *Deleted

## 2016-12-09 MED ORDER — AZITHROMYCIN 250 MG PO TABS: ORAL_TABLET | ORAL | 0 refills | Status: DC

## 2016-12-09 NOTE — Telephone Encounter (Signed)
Called to report that her mucous is now green and Dr Mike Gip told her if it turns color she would start her on abx. Denies fever. Per VO Dr Grayland Ormond, Zpak. Patient informed

## 2016-12-15 ENCOUNTER — Ambulatory Visit: Payer: 59

## 2016-12-15 ENCOUNTER — Ambulatory Visit: Payer: 59 | Admitting: Hematology and Oncology

## 2016-12-15 ENCOUNTER — Other Ambulatory Visit: Payer: 59

## 2016-12-24 ENCOUNTER — Encounter: Payer: Self-pay | Admitting: Hematology and Oncology

## 2016-12-24 ENCOUNTER — Other Ambulatory Visit: Payer: Self-pay | Admitting: Hematology and Oncology

## 2016-12-24 NOTE — Progress Notes (Signed)
[facility] day:  12/08/2016  Chief Complaint: Cassidy Bradley is an 55 y.o. female with metastatic uterine leiomyosarcoma who is seen for nadir assessment on day 11 of cycle #1 dacarbazine.  HPI: The patient was last seen in the medical oncology clinic on 11/28/2016.  At that time, she was doing well.  Scans revealed progressive disease.  She received cycle #1 dacarbazine with OnPro Neulasta support.  Symptomatically, she tolerated her chemotherapy well.  She took her Decadron and Claritin.  She denied any nausea or Neulasta induced bone pain.  She has some congestion today.  She has had a cough.  She thinks she pulled a muscle secondary to her coughing.  She denies any fever.  She is feeling better today.  Her husband has pneumonia.     Past Medical History:  Diagnosis Date  . Breast lump 2006    both breast   . Cancer ([facility])    Uterine Leiomyosarcoma  . Chest wall mass   . DVT (deep venous thrombosis) ([facility])    Left leg  . Uterine cancer ([place]) 01/2015   chemo    Past Surgical History:  Procedure Laterality Date  . ABDOMINAL HYSTERECTOMY  01/30/15  . ANKLE SURGERY  2005  . BACK SURGERY  2005  . BREAST BIOPSY Bilateral 10 + yrs ago   neg  . CESAREAN SECTION  1993  . CHOLECYSTECTOMY  2001  . COLONOSCOPY     Family History  Problem Relation Age of Onset  . Colon polyps Mother   . Lupus Father   . Esophageal cancer Maternal Uncle   . Breast cancer Sister 4    fraternal twin sister  . Breast cancer Maternal Aunt   . Breast cancer Maternal Grandmother 60  . Stomach cancer Maternal Aunt   . Lung cancer Maternal Grandfather   . Cancer - Colon Cousin 37    first cousin    Social History:  reports that she has never smoked. She has never used smokeless tobacco. She reports that she does not drink alcohol or use drugs.   The patient is alone today.  Allergies:  Allergies  Allergen Reactions  . Pravastatin Hives and Nausea And  Vomiting  . Sulfa Antibiotics Hives and Swelling  . Latex Rash  . Other Rash and Other (See Comments)    Pt states that she is allergic to spandex.   Current Medications: Current Outpatient Prescriptions  Medication Sig Dispense Refill  . dexamethasone (DECADRON) 4 MG tablet Take 2 tablets by mouth once a day on the day after chemotherapy and then take 2 tablets two times a day for 2 days. Take with food. 30 tablet 1  . ferrous sulfate 325 (65 FE) MG tablet Take 1 tablet (325 mg total) by mouth 2 (two) times daily with a meal. 60 tablet 0  . lidocaine-prilocaine (EMLA) cream Apply 1 application topically as needed. Apply one hour prior to chemotherapy (Patient taking differently: Apply 1 application topically as needed (prior to accessing pts port). Pt applies one hour prior to chemotherapy.) 30 g 1  . loratadine (CLARITIN) 10 MG tablet Take 10 mg by mouth daily. Reported on 07/02/2016    . Multiple Vitamins-Minerals (ALIVE WOMENS ENERGY) TABS Take 1 tablet by mouth daily.     . ondansetron (ZOFRAN) 8 MG tablet Take 8 mg by mouth every 8 (eight) hours as needed for nausea or vomiting. Reported on 06/26/2016    . polyethylene glycol powder (  GLYCOLAX/MIRALAX) powder Take 1 Container by mouth daily as needed. Reported on 06/26/2016    . rivaroxaban (XARELTO) 20 MG TABS tablet Take 1 tablet by mouth  daily with supper 90 tablet 3  . azithromycin (ZITHROMAX) 250 MG tablet Take 2 tabs on day one and 1 tablet daily days 2 - 5 6 each 0  . potassium chloride (K-DUR) 10 MEQ tablet Take 1 tablet (10 mEq total) by mouth 2 (two) times daily. For 2 days (Patient not taking: Reported on 12/08/2016) 20 tablet 0  . prochlorperazine (COMPAZINE) 10 MG tablet Take 1 tablet (10 mg total) by mouth every 6 (six) hours as needed (Nausea or vomiting). (Patient not taking: Reported on 12/08/2016) 30 tablet 1   No current facility-administered medications for this visit.    Facility-Administered Medications Ordered in Other  Visits  Medication Dose Route Frequency Provider Last Rate Last Dose  . 0.9 %  sodium chloride infusion   Intravenous Continuous Lequita Asal, MD   Stopped at 05/01/16 1340  . 0.9 %  sodium chloride infusion   Intravenous Continuous Lequita Asal, MD   Stopped at 06/05/16 1235  . heparin lock flush 100 unit/mL  500 Units Intracatheter Once PRN Lequita Asal, MD      . heparin lock flush 100 unit/mL  500 Units Intracatheter Once PRN Lequita Asal, MD      . heparin lock flush 100 unit/mL  500 Units Intracatheter Once PRN Lequita Asal, MD      . heparin lock flush 100 unit/mL  500 Units Intracatheter Once PRN Lequita Asal, MD      . sodium chloride 0.9 % injection 10 mL  10 mL Intracatheter PRN Lequita Asal, MD      . sodium chloride flush (NS) 0.9 % injection 10 mL  10 mL Intracatheter PRN Lequita Asal, MD   10 mL at 04/03/16 1241  . sodium chloride flush (NS) 0.9 % injection 10 mL  10 mL Intracatheter PRN Lequita Asal, MD   10 mL at 05/01/16 1225  . sodium chloride flush (NS) 0.9 % injection 10 mL  10 mL Intracatheter PRN Lequita Asal, MD   10 mL at 06/05/16 1135  . sodium chloride flush (NS) 0.9 % injection 10 mL  10 mL Intracatheter PRN Lequita Asal, MD   10 mL at 07/02/16 1050  . sodium chloride flush (NS) 0.9 % injection 10 mL  10 mL Intravenous PRN Lequita Asal, MD   10 mL at 08/29/16 1449    Review of Systems:  GENERAL:  Feels good.  No fevers or sweats. Weight down 4 pounds. PERFORMANCE STATUS (ECOG):  2 HEENT:  No visual changes,sore throat, mouth sores or tenderness. Lungs:  URI symptoms.  No shortness of breath.  Cough.  No hemoptysis. Cardiac:  No chest pain, palpitations, orthopnea, or PND. GI:   No nausea, vomiting, constipation, melena, or hematochezia. GU:  No urgency, frequency, dysuria, or hematuria. Musculoskeletal:  No bone or back pain.  No joint pain.  Muscle tenderness in anterior thighs,  improved. Extremities: Mild left lower extremity swelling, stable.  No pain. Skin:  No rashes or skin changes. Neuro:  No headache, numbness or weakness, balance or coordination issues. Endocrine:  No diabetes, thyroid issues, hot flashes or night sweats. Psych:  No mood changes, depression or anxiety. Pain:  No pain. Review of systems:  All other systems reviewed and found to be negative.  Physical Exam: Blood pressure 115/82, pulse (!) 101, temperature 98 F (36.7 C), temperature source Tympanic, resp. rate 18, weight 148 lb 13 oz (67.5 kg).  GENERAL:  Well developed, well nourished, woman sitting comfortably in the exam room in no acute distress. MENTAL STATUS:  Alert and oriented to person, place and time. HEAD:  Curly gray hair.  Normocephalic, atraumatic, face symmetric, no Cushingoid features. EYES:  Glasses.  Brown eyes.  Pupils equal round and reactive to light and accomodation.  No conjunctivitis or scleral icterus. ENT:  Oropharynx clear without lesion.  Tongue normal. Mucous membranes moist.  RESPIRATORY:  Clear to auscultation without rales, wheezes or rhonchi. CARDIOVASCULAR:  Regular rate and rhythm without murmur, rub or gallop. No JVD. ABDOMEN:  Soft, non-tender, with active bowel sounds, and no hepatosplenomegaly.  No masses. SKIN:  No rashes, ulcers or lesions. EXTREMITIES:  1+ left lower extremity edema below the knee (stable).  LYMPH NODES:  No palpable cervical, supraclavicular, axillary or inguinal adenopathy  NEUROLOGICAL: Appropriate. PSYCH:  Appropriate.  Laboratory testing: [facility] labs on 02/28/2016 included a hematocrit 36.1, hemoglobin 12.1, platelets 206,000, white count 13,200 with an [place] of 11,400.  Comprehensive metabolic panel included a creatinine of 0.67. Liver function tests included an alkaline phosphatase of 122 (39-117).    [facility] labs on 03/12/2016 included a comprehensive metabolic panel included a creatinine of 0.57.   Liver function tests included an alkaline phosphatase of 118 (39-117) post Neupogen.  Phosphorus was 3.4.  Creatinine kinase was 156 (24-173).  [facility] labs on 04/02/2016 included hematocrit of 33.3, hemoglobin 11.0, platelets 229,000, WBC 2700 with an ANC of 1400.  Comprehensive metabolic panel included a creatinine of 0.61.  Liver function tests included an alkaline phosphatase of 129 (39-117), AST 54 (0-40) and ALT 76 (0-32).  Phosphorus was 3.9.  Creatinine kinase was 120 (24-173).  [facility] labs on 04/23/2016 included hematocrit of 30.4, hemoglobin 10.0, platelets 266,000, WBC 3200 with an ANC of 1600.  Comprehensive metabolic panel included a creatinine of 0.68.  Liver function tests included an alkaline phosphatase of 195 (39-117), AST 45 (0-40) and ALT 69 (0-32).  Phosphorus was 3.2.  Creatinine kinase was 258 (24-173).  [facility] labs on 04/30/2016 included hematocrit of 32.0, hemoglobin 10.8, platelets 287,000, WBC 2100 with an ANC of 900.  Comprehensive metabolic panel included a creatinine of 0.68.  Liver function tests included an alkaline phosphatase of 166 (39-117), AST 42 (0-40) and ALT 44 (0-32).  Phosphorus was 4.0.  Creatinine kinase was 182 (24-173).  [facility] labs on 05/21/2016 included hematocrit of 28.9, hemoglobin 9.4, platelets 167,000, WBC 2500 with an ANC of 1,100.  Comprehensive metabolic panel included a creatinine of 0.68.  Liver function tests included an alkaline phosphatase of 244 (39-117), AST 63 (0-40) and ALT 86 (0-32).  Phosphorus was 4.0.  Creatinine kinase was 433 (24-173).  [facility] labs on 06/04/2016 included hematocrit of 32.7, hemoglobin 10.6, platelets 287,000, WBC 2800 with an ANC of 1,300. Comprehensive metabolic panel included a creatinine of 0.68. Liver function tests included an alkaline phosphatase of 241 (39-117), AST 64 (0-40) and ALT 67 (0-32). Phosphorus was 4.0. Creatinine kinase was 229 (24-173).  [facility] labs on 06/25/2016 included hematocrit of  28.1, hemoglobin 9.3, platelets 155,000, WBC 2500 with an ANC of 1,000.  Comprehensive metabolic panel included a creatinine of 0.62.  Liver function tests included an alkaline phosphatase of 240 (39-117), AST 72 (0-40) and ALT 80 (0-32).  Phosphorus was 3.6.  Creatinine kinase was 8.6 (0-5.3).  Orders Only on 12/08/2016  Component Date Value Ref Range Status  . WBC 12/08/2016 5.9  3.6 - 11.0 K/uL Final  . RBC 12/08/2016 4.44  3.80 - 5.20 MIL/uL Final  . Hemoglobin 12/08/2016 13.4  12.0 - 16.0 g/dL Final  . HCT 12/08/2016 39.7  35.0 - 47.0 % Final  . MCV 12/08/2016 89.4  80.0 - 100.0 fL Final  . MCH 12/08/2016 30.2  26.0 - 34.0 pg Final  . MCHC 12/08/2016 33.8  32.0 - 36.0 g/dL Final  . RDW 12/08/2016 15.4* 11.5 - 14.5 % Final  . Platelets 12/08/2016 156  150 - 440 K/uL Final  . Neutrophils Relative % 12/08/2016 79  % Final  . Neutro Abs 12/08/2016 4.6  1.4 - 6.5 K/uL Final  . Lymphocytes Relative 12/08/2016 12  % Final  . Lymphs Abs 12/08/2016 0.7* 1.0 - 3.6 K/uL Final  . Monocytes Relative 12/08/2016 8  % Final  . Monocytes Absolute 12/08/2016 0.5  0.2 - 0.9 K/uL Final  . Eosinophils Relative 12/08/2016 1  % Final  . Eosinophils Absolute 12/08/2016 0.1  0 - 0.7 K/uL Final  . Basophils Relative 12/08/2016 0  % Final  . Basophils Absolute 12/08/2016 0.0  0 - 0.1 K/uL Final  . Sodium 12/08/2016 135  135 - 145 mmol/L Final  . Potassium 12/08/2016 4.0  3.5 - 5.1 mmol/L Final  . Chloride 12/08/2016 100* 101 - 111 mmol/L Final  . CO2 12/08/2016 28  22 - 32 mmol/L Final  . Glucose, Bld 12/08/2016 95  65 - 99 mg/dL Final  . BUN 12/08/2016 14  6 - 20 mg/dL Final  . Creatinine, Ser 12/08/2016 0.58  0.44 - 1.00 mg/dL Final  . Calcium 12/08/2016 9.4  8.9 - 10.3 mg/dL Final  . GFR calc non Af Amer 12/08/2016 >60  >60 mL/min Final  . GFR calc Af Amer 12/08/2016 >60  >60 mL/min Final   Comment: (NOTE) The eGFR has been calculated using the CKD EPI equation. This calculation has not been  validated in all clinical situations. eGFR's persistently <60 mL/min signify possible Chronic Kidney Disease.   Georgiann Hahn gap 12/08/2016 7  5 - 15 Final   Assessment:  Cassidy Bradley is a 55 y.o. female 55 y.o. female with stage IV uterine leiomyosarcoma. She underwent TAH/BSO on 01/30/2015. Pathology confirmed high grade leiomyosarcoma with 2 large adjacent tumor nodules (10 cm and 5 cm) and a separate 0.5 cm serosal nodule.   Chest, abdomen, and pelvic CT scan on 02/12/2015 revealed a 1.7 x 2.3 cm right external iliac node. Pulmonary nodules were scattered throughout the lungs bilaterally. There was left internal mammary chain adenopathy measuring up to 3.6 x 2 cm.  She underwent anterior chest wall pleural-based CT guided biopsy on 02/21/2015. Pathology was consistent with metastatic leiomyosarcoma. Bone scan on 02/28/2015 revealed no evidence of metastatic disease with indeterminate uptake in the left posterior rib.   She has a fraternal twin who developed breast cancer at age 63. Several other family members have malignancies. 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  Prechemotherapy labs from [facility] revealed leukopenia (WBC 2300 with ANC 900). Work-up to date is negative. She has a family history of leukopenia. Bone marrow on 03/08/2015 revealed no evidence of neoplasia or metastatic disease. Marrow was normocellular for age (30-50%) with trilineage hematopoiesis. There was no increase in marrow reticulin fibers. Flow cytometry and cytogenetics were normal.  She received 4 cycles of gemcitabine  and Taxotere (03/20/2015 - 05/22/2015) with Neulasta support. She received gemcitabine alone on 05/22/2015.  She was admitted on 05/24/2015 with an acute febrile illness. All cultures were negative.   Chest CT on 05/25/2015 revealed early progressive disease. Abdominal and pelvic CT scan on 05/30/2015  revealed stable to slightly increased right external iliac lymph node (1.8 x 2.5 cm).   Echocardiogram on 06/18/2015 revealed EF of 60-65%.  Echo on 10/22/2015 revealed an EF of 65%.  MUGA on 12/25/2015 revealed an EF of 52%.  Echo on 03/03/2016 revealed an EF of 50%.  Echo on 05/21/2016 revealed an EF of 55%.  Echo on 07/21/2016 revealed an EF of 45-50%.  Bilateral lower extremity duplex on 05/30/2015 was negative.  Left upper extremity duplex on 06/08/2015 was negative.  Left lower extremity duplex on 07/19/2015 documented a DVT in the popliteal vein.   Left lower extremity duplex on 01/31/2016 revealed re-cannulization of the prior DVT involving the left popliteal vein.  She is on Xarelto.   She received 8 cycles of adriamycin (06/07/2015 - 12/06/2015) with Neulasta (On-Pro) support.  Cycle #3 was complicated by an acute  left lower extremity DVT.  Cycle #6 was postponed a week secondary to neutropenia (ANC 700).  Zinecard was added with cycle #7.  Olaratumab Drucilla Schmidt) was added with cycle #8.  Chest, abdomen, and pelvic CT scan on 08/07/2015 revealed a partial treatment response with decrease anterior left upper pleural metastasis, stable to decreased pulmonary metastases and decreased right external iliac adenopathy. There were no new sites of disease.  Chest, abdomen, and pelvic CT scan on 10/22/2015 revealed mild decrease in left anterior chest wall mass/internal mammary lymphadenopathy (3.1 x 4.6 cm to 2.5 x 4.4 cm).  There was decreased mild right external iliac lymphadenopathy (1.4 cm to 1.2 cm).  There was diffuse stable bilateral pulmonary metastases (largest 1.1 cm in anterior right lung).  There was no new or progressive metastatic disease.  Chest CT on 12/25/2015 on revealed slight progression in pulmonary nodules (index nodules: 5 mm to 11 mm and 8 mm to 11 mm).  The dominant soft tissue lesion along the left hemi-thorax was 2.2 x 4.3 cm (stable).  She received 3 cycles of single agent  olaratumab Drucilla Schmidt) (12/27/2015 -02/08/2016).  She required GCSF secondary to neutropenia.  PET scan on 02/26/2016 revealed progression of numerous pulmonary nodules/metastasis and mild progression (1.2 cm with SUV 7) of right pelvic nodal metastasis.  In the right lung apex, there was a 1.3 x 1.0 cm (SUV 6.2) previously 0.9 x 0.6 cm.  In the anterior left lung was a 2.0 x 2.2 cm (SUV 12.4) previously 1.1 x 0.9 cm.  In the anterior right middle lobe was a 1.1 x 1.1 cm (SUV 2.9), previously 0.9 x 0.9 cm.  The subpleural mass along the anterior of the left upper lobe was 2.1 x 3.5 cm (not FDG avid), previously 2.2 x 3.4 cm.  Bone scan on 03/05/2016 revealed no evidence of metastatic disease.  She received 5 cycles of trabectedin (Yondelis) (03/13/2016 - 07/02/2016) with Neulasta support.  She developed rhabdomyolysis with cycle #5 (CPK 5000).    Chest CT on 04/28/2016 revealed innumerable pulmonary metastases which had slightly increased (39mm) in size since 02/26/2016.  PET scan on 08/01/2016 revealed that most of the pulmonary nodules were smaller and demonstrated decrease in metabolic activity. However, the largest lesion in the left upper lobe had increased in metabolic activity (SUV max increased from 12.4 to 17.4) but was fairly similar  in size (2.6 cm now compared to 2.3 cm).  There was no mediastinal or hilar hypermetabolic adenopathy.  The right pelvic side wais stable in size (SUV max decreased from 6.9 to 2.9).  There was no new abdominal or pelvic lymphadenopathy.  Chest, abdomen, and pelvic CT scan on 11/25/2016 revealed an interval increase in the size of the dominant mass in the left upper lobe (4.2 x 2.9 cm). There was interval increase in size of innumerable bilateral pulmonary nodules. There was interval increase in the size of the right hilar lymph node (1.5 cm). There was interval increase in the size of hepatic hypodensities (nodules 8-9 mm) highly concerning for hepatic metastasis.  There was a stable small pelvic sidewall lesion (1.1 cm).  She is day 11 of cycle #1 decarbazine (11/28/2016) with Neulasta support.  Counts are good.  Anemia work-up on 07/28/2016 revealed the following normal labs: ferritin (529), iron saturation (26%), TIBC (287), B12, and folate.  Symptomatically, she feels good.  She has a URI.  Exam is stable.  Plan: 1.  Labs today:  CBC with diff, BMP. 2.  Discuss counts with Neulasta.  Discuss dacarbazine every 3 weeks.  Discuss dose range 850 mg/m2 - 1200 mg/m2.  Discuss cycle #1 at 1000 mg/m2.  Discuss advancing dose if tolerated. 3.  Discuss scans every 2-3 months. 4.  RTC on 12/25/2016 for MD assessment, labs (CBC with diff, CMP, Mg), and cycle #2 dacarbazine.   Lequita Asal, MD 12/08/2016

## 2016-12-24 NOTE — Progress Notes (Signed)
[facility]-  Cancer Center  Clinic day:  12/25/2016   Chief Complaint: Cassidy Bradley is an 55 y.o. female with metastatic uterine leiomyosarcoma who is seen for assessment prior to cycle #2 dacarbazine.  HPI: The patient was last seen in the medical oncology clinic on 12/08/2016.  At that time, she was seen for nadir assessment after cycle #1 dacarbazine.  Counts were good.  She had minimal nausea.  She had a URI.  Supportive care was discussed.    During the interim, she has continued to feel "pretty good".  She went to the walk-in clinic on Sunday, 12/21/2016, for sinus symptoms.  She was prescribed Levaquin.  She feels like she pulled a muscle in her right shoulder.  She needed to take a pain pill.  She has been coughing.  She feels better today.  She denies any fever.   Past Medical History:  Diagnosis Date  . Breast lump 2006    both breast   . Cancer (HCC)    Uterine Leiomyosarcoma  . Chest wall mass   . DVT (deep venous thrombosis) (HCC)    Left leg  . Uterine cancer (HCC) 01/2015   chemo    Past Surgical History:  Procedure Laterality Date  . ABDOMINAL HYSTERECTOMY  01/30/15  . ANKLE SURGERY  2005  . BACK SURGERY  2005  . BREAST BIOPSY Bilateral 10 + yrs ago   neg  . CESAREAN SECTION  1993  . CHOLECYSTECTOMY  2001  . COLONOSCOPY     Family History  Problem Relation Age of Onset  . Colon polyps Mother   . Lupus Father   . Esophageal cancer Maternal Uncle   . Breast cancer Sister 24    fraternal twin sister  . Breast cancer Maternal Aunt   . Breast cancer Maternal Grandmother 60  . Stomach cancer Maternal Aunt   . Lung cancer Maternal Grandfather   . Cancer - Colon Cousin 59    first cousin    Social History:  reports that she has never smoked. She has never used smokeless tobacco. She reports that she does not drink alcohol or use drugs.   The patient is accompanied by her twin sister today.  Allergies:  Allergies  Allergen  Reactions  . Pravastatin Hives and Nausea And Vomiting  . Sulfa Antibiotics Hives and Swelling  . Latex Rash  . Other Rash and Other (See Comments)    Pt states that she is allergic to spandex.   Current Medications: Current Outpatient Prescriptions  Medication Sig Dispense Refill  . dexamethasone (DECADRON) 4 MG tablet Take 2 tablets by mouth once a day on the day after chemotherapy and then take 2 tablets two times a day for 2 days. Take with food. 30 tablet 1  . ferrous sulfate 325 (65 FE) MG tablet Take 1 tablet (325 mg total) by mouth 2 (two) times daily with a meal. 60 tablet 0  . levofloxacin (LEVAQUIN) 500 MG tablet     . lidocaine-prilocaine (EMLA) cream Apply 1 application topically as needed. Apply one hour prior to chemotherapy (Patient taking differently: Apply 1 application topically as needed (prior to accessing pts port). Pt applies one hour prior to chemotherapy.) 30 g 1  . loratadine (CLARITIN) 10 MG tablet Take 10 mg by mouth daily. Reported on 07/02/2016    . Multiple Vitamins-Minerals (ALIVE WOMENS ENERGY) TABS Take 1 tablet by mouth daily.     . ondansetron (ZOFRAN) 8 MG tablet Take 8  mg by mouth every 8 (eight) hours as needed for nausea or vomiting. Reported on 06/26/2016    . polyethylene glycol powder (GLYCOLAX/MIRALAX) powder Take 1 Container by mouth daily as needed. Reported on 06/26/2016    . rivaroxaban (XARELTO) 20 MG TABS tablet Take 1 tablet by mouth  daily with supper 90 tablet 3  . azithromycin (ZITHROMAX) 250 MG tablet Take 2 tabs on day one and 1 tablet daily days 2 - 5 (Patient not taking: Reported on 12/25/2016) 6 each 0  . potassium chloride (K-DUR) 10 MEQ tablet Take 1 tablet (10 mEq total) by mouth 2 (two) times daily. For 2 days (Patient not taking: Reported on 12/25/2016) 20 tablet 0  . prochlorperazine (COMPAZINE) 10 MG tablet Take 1 tablet (10 mg total) by mouth every 6 (six) hours as needed (Nausea or vomiting). (Patient not taking: Reported on  12/25/2016) 30 tablet 1   No current facility-administered medications for this visit.    Facility-Administered Medications Ordered in Other Visits  Medication Dose Route Frequency Provider Last Rate Last Dose  . 0.9 %  sodium chloride infusion   Intravenous Continuous Lequita Asal, MD   Stopped at 05/01/16 1340  . 0.9 %  sodium chloride infusion   Intravenous Continuous Lequita Asal, MD   Stopped at 06/05/16 1235  . heparin lock flush 100 unit/mL  500 Units Intracatheter Once PRN Lequita Asal, MD      . heparin lock flush 100 unit/mL  500 Units Intracatheter Once PRN Lequita Asal, MD      . heparin lock flush 100 unit/mL  500 Units Intracatheter Once PRN Lequita Asal, MD      . heparin lock flush 100 unit/mL  500 Units Intracatheter Once PRN Lequita Asal, MD      . sodium chloride 0.9 % injection 10 mL  10 mL Intracatheter PRN Lequita Asal, MD      . sodium chloride flush (NS) 0.9 % injection 10 mL  10 mL Intracatheter PRN Lequita Asal, MD   10 mL at 04/03/16 1241  . sodium chloride flush (NS) 0.9 % injection 10 mL  10 mL Intracatheter PRN Lequita Asal, MD   10 mL at 05/01/16 1225  . sodium chloride flush (NS) 0.9 % injection 10 mL  10 mL Intracatheter PRN Lequita Asal, MD   10 mL at 06/05/16 1135  . sodium chloride flush (NS) 0.9 % injection 10 mL  10 mL Intracatheter PRN Lequita Asal, MD   10 mL at 07/02/16 1050  . sodium chloride flush (NS) 0.9 % injection 10 mL  10 mL Intravenous PRN Lequita Asal, MD   10 mL at 08/29/16 1449    Review of Systems:  GENERAL:  Feels "pretty good".  No fevers or sweats. Weight up 1 pound. PERFORMANCE STATUS (ECOG):  2 HEENT:  No visual changes,sore throat, mouth sores or tenderness. Lungs:   Sinus symptoms, improving (see HPI).  No shortness of breath.  Slight cough.  No hemoptysis. Cardiac:  No chest pain, palpitations, orthopnea, or PND. GI:   No nausea, vomiting, constipation,  melena, or hematochezia. GU:  No urgency, frequency, dysuria, or hematuria. Musculoskeletal:  Right shoulder pain.  No back pain. Extremities: Mild left lower extremity swelling, stable.  No pain. Skin:  No rashes or skin changes. Neuro:  No headache, numbness or weakness, balance or coordination issues. Endocrine:  No diabetes, thyroid issues, hot flashes or night sweats. Psych:  No mood changes, depression or anxiety. Pain:  Right shoulder pain. Review of systems:  All other systems reviewed and found to be negative.                        Physical Exam: Blood pressure (!) 134/91, pulse (!) 103, temperature (!) 96.1 F (35.6 C), temperature source Tympanic, resp. rate 18, weight 149 lb 7.6 oz (67.8 kg).  GENERAL:  Well developed, well nourished, woman sitting comfortably in the exam room in no acute distress. MENTAL STATUS:  Alert and oriented to person, place and time. HEAD:  Curly gray hair.  Normocephalic, atraumatic, face symmetric, no Cushingoid features. EYES:  Glasses.  Brown eyes.  Pupils equal round and reactive to light and accomodation.  No conjunctivitis or scleral icterus. ENT:  Oropharynx clear without lesion.  Tongue normal. Mucous membranes moist.  RESPIRATORY:  Clear to auscultation without rales, wheezes or rhonchi. CARDIOVASCULAR:  Regular rate and rhythm without murmur, rub or gallop. No JVD. ABDOMEN:  Soft, non-tender, with active bowel sounds, and no hepatosplenomegaly.  No masses. SKIN:  No rashes, ulcers or lesions. EXTREMITIES:  1+ left lower extremity edema below the knee (stable).   Full range of motion right shoulder. LYMPH NODES:  No palpable cervical, supraclavicular, axillary or inguinal adenopathy  NEUROLOGICAL: Appropriate. PSYCH:  Appropriate.  Laboratory testing: [facility] labs on 02/28/2016 included a hematocrit 36.1, hemoglobin 12.1, platelets 206,000, white count 13,200 with an [place] of 11,400.  Comprehensive metabolic panel included a creatinine of  0.67. Liver function tests included an alkaline phosphatase of 122 (39-117).    [facility] labs on 03/12/2016 included a comprehensive metabolic panel included a creatinine of 0.57.  Liver function tests included an alkaline phosphatase of 118 (39-117) post Neupogen.  Phosphorus was 3.4.  Creatinine kinase was 156 (24-173).  [facility] labs on 04/02/2016 included hematocrit of 33.3, hemoglobin 11.0, platelets 229,000, WBC 2700 with an ANC of 1400.  Comprehensive metabolic panel included a creatinine of 0.61.  Liver function tests included an alkaline phosphatase of 129 (39-117), AST 54 (0-40) and ALT 76 (0-32).  Phosphorus was 3.9.  Creatinine kinase was 120 (24-173).  [facility] labs on 04/23/2016 included hematocrit of 30.4, hemoglobin 10.0, platelets 266,000, WBC 3200 with an ANC of 1600.  Comprehensive metabolic panel included a creatinine of 0.68.  Liver function tests included an alkaline phosphatase of 195 (39-117), AST 45 (0-40) and ALT 69 (0-32).  Phosphorus was 3.2.  Creatinine kinase was 258 (24-173).  [facility] labs on 04/30/2016 included hematocrit of 32.0, hemoglobin 10.8, platelets 287,000, WBC 2100 with an ANC of 900.  Comprehensive metabolic panel included a creatinine of 0.68.  Liver function tests included an alkaline phosphatase of 166 (39-117), AST 42 (0-40) and ALT 44 (0-32).  Phosphorus was 4.0.  Creatinine kinase was 182 (24-173).  [facility] labs on 05/21/2016 included hematocrit of 28.9, hemoglobin 9.4, platelets 167,000, WBC 2500 with an ANC of 1,100.  Comprehensive metabolic panel included a creatinine of 0.68.  Liver function tests included an alkaline phosphatase of 244 (39-117), AST 63 (0-40) and ALT 86 (0-32).  Phosphorus was 4.0.  Creatinine kinase was 433 (24-173).  [facility] labs on 06/04/2016 included hematocrit of 32.7, hemoglobin 10.6, platelets 287,000, WBC 2800 with an ANC of 1,300. Comprehensive metabolic panel included a creatinine of 0.68. Liver function tests included an  alkaline phosphatase of 241 (39-117), AST 64 (0-40) and ALT 67 (0-32). Phosphorus was 4.0. Creatinine kinase was 229 (24-173).  [facility] labs  on 06/25/2016 included hematocrit of 28.1, hemoglobin 9.3, platelets 155,000, WBC 2500 with an ANC of 1,000.  Comprehensive metabolic panel included a creatinine of 0.62.  Liver function tests included an alkaline phosphatase of 240 (39-117), AST 72 (0-40) and ALT 80 (0-32).  Phosphorus was 3.6.  Creatinine kinase was 8.6 (0-5.3).   Appointment on 12/25/2016  Component Date Value Ref Range Status  . WBC 12/25/2016 3.4* 3.6 - 11.0 K/uL Final  . RBC 12/25/2016 3.75* 3.80 - 5.20 MIL/uL Final  . Hemoglobin 12/25/2016 11.7* 12.0 - 16.0 g/dL Final  . HCT 12/25/2016 34.2* 35.0 - 47.0 % Final  . MCV 12/25/2016 91.1  80.0 - 100.0 fL Final  . MCH 12/25/2016 31.2  26.0 - 34.0 pg Final  . MCHC 12/25/2016 34.2  32.0 - 36.0 g/dL Final  . RDW 12/25/2016 17.2* 11.5 - 14.5 % Final  . Platelets 12/25/2016 292  150 - 440 K/uL Final  . Neutrophils Relative % 12/25/2016 59  % Final  . Neutro Abs 12/25/2016 2.0  1.4 - 6.5 K/uL Final  . Lymphocytes Relative 12/25/2016 24  % Final  . Lymphs Abs 12/25/2016 0.8* 1.0 - 3.6 K/uL Final  . Monocytes Relative 12/25/2016 14  % Final  . Monocytes Absolute 12/25/2016 0.5  0.2 - 0.9 K/uL Final  . Eosinophils Relative 12/25/2016 2  % Final  . Eosinophils Absolute 12/25/2016 0.1  0 - 0.7 K/uL Final  . Basophils Relative 12/25/2016 1  % Final  . Basophils Absolute 12/25/2016 0.0  0 - 0.1 K/uL Final  . Sodium 12/25/2016 137  135 - 145 mmol/L Final  . Potassium 12/25/2016 3.2* 3.5 - 5.1 mmol/L Final  . Chloride 12/25/2016 106  101 - 111 mmol/L Final  . CO2 12/25/2016 27  22 - 32 mmol/L Final  . Glucose, Bld 12/25/2016 105* 65 - 99 mg/dL Final  . BUN 12/25/2016 11  6 - 20 mg/dL Final  . Creatinine, Ser 12/25/2016 0.64  0.44 - 1.00 mg/dL Final  . Calcium 12/25/2016 9.2  8.9 - 10.3 mg/dL Final  . Total Protein 12/25/2016 7.6  6.5 -  8.1 g/dL Final  . Albumin 12/25/2016 3.8  3.5 - 5.0 g/dL Final  . AST 12/25/2016 63* 15 - 41 U/L Final  . ALT 12/25/2016 57* 14 - 54 U/L Final  . Alkaline Phosphatase 12/25/2016 158* 38 - 126 U/L Final  . Total Bilirubin 12/25/2016 0.6  0.3 - 1.2 mg/dL Final  . GFR calc non Af Amer 12/25/2016 >60  >60 mL/min Final  . GFR calc Af Amer 12/25/2016 >60  >60 mL/min Final   Comment: (NOTE) The eGFR has been calculated using the CKD EPI equation. This calculation has not been validated in all clinical situations. eGFR's persistently <60 mL/min signify possible Chronic Kidney Disease.   . Anion gap 12/25/2016 4* 5 - 15 Final  . Magnesium 12/25/2016 1.7  1.7 - 2.4 mg/dL Final   Assessment:  Cassidy Bradley is a 55 y.o. female 55 y.o. female with stage IV uterine leiomyosarcoma. She underwent TAH/BSO on 01/30/2015. Pathology confirmed high grade leiomyosarcoma with 2 large adjacent tumor nodules (10 cm and 5 cm) and a separate 0.5 cm serosal nodule.   Chest, abdomen, and pelvic CT scan on 02/12/2015 revealed a 1.7 x 2.3 cm right external iliac node. Pulmonary nodules were scattered throughout the lungs bilaterally. There was left internal mammary chain adenopathy measuring up to 3.6 x 2 cm.  She underwent anterior chest wall pleural-based CT guided biopsy  on 02/21/2015. Pathology was consistent with metastatic leiomyosarcoma. Bone scan on 02/28/2015 revealed no evidence of metastatic disease with indeterminate uptake in the left posterior rib.   She has a fraternal twin who developed breast cancer at age 69. Several other family members have malignancies. 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  Prechemotherapy labs from [facility] revealed leukopenia (WBC 2300 with ANC 900). Work-up to date is negative. She has a family history of leukopenia. Bone marrow on 03/08/2015 revealed no evidence of neoplasia or  metastatic disease. Marrow was normocellular for age (30-50%) with trilineage hematopoiesis. There was no increase in marrow reticulin fibers. Flow cytometry and cytogenetics were normal.  She received 4 cycles of gemcitabine and Taxotere (03/20/2015 - 05/22/2015) with Neulasta support. She received gemcitabine alone on 05/22/2015.  She was admitted on 05/24/2015 with an acute febrile illness. All cultures were negative.   Chest CT on 05/25/2015 revealed early progressive disease. Abdominal and pelvic CT scan on 05/30/2015 revealed stable to slightly increased right external iliac lymph node (1.8 x 2.5 cm).   Echocardiogram on 06/18/2015 revealed EF of 60-65%.  Echo on 10/22/2015 revealed an EF of 65%.  MUGA on 12/25/2015 revealed an EF of 52%.  Echo on 03/03/2016 revealed an EF of 50%.  Echo on 05/21/2016 revealed an EF of 55%.  Echo on 07/21/2016 revealed an EF of 45-50%.  Bilateral lower extremity duplex on 05/30/2015 was negative.  Left upper extremity duplex on 06/08/2015 was negative.  Left lower extremity duplex on 07/19/2015 documented a DVT in the popliteal vein.   Left lower extremity duplex on 01/31/2016 revealed re-cannulization of the prior DVT involving the left popliteal vein.  She is on Xarelto.   She received 8 cycles of adriamycin (06/07/2015 - 12/06/2015) with Neulasta (On-Pro) support.  Cycle #3 was complicated by an acute  left lower extremity DVT.  Cycle #6 was postponed a week secondary to neutropenia ([place] 700).  Zinecard was added with cycle #7.  Olaratumab [name] was added with cycle #8.  Chest, abdomen, and pelvic CT scan on 08/07/2015 revealed a partial treatment response with decrease anterior left upper pleural metastasis, stable to decreased pulmonary metastases and decreased right external iliac adenopathy. There were no new sites of disease.  Chest, abdomen, and pelvic CT scan on 10/22/2015 revealed mild decrease in left anterior chest wall mass/internal  mammary lymphadenopathy (3.1 x 4.6 cm to 2.5 x 4.4 cm).  There was decreased mild right external iliac lymphadenopathy (1.4 cm to 1.2 cm).  There was diffuse stable bilateral pulmonary metastases (largest 1.1 cm in anterior right lung).  There was no new or progressive metastatic disease.  Chest CT on 12/25/2015 on revealed slight progression in pulmonary nodules (index nodules: 5 mm to 11 mm and 8 mm to 11 mm).  The dominant soft tissue lesion along the left hemi-thorax was 2.2 x 4.3 cm (stable).  She received 3 cycles of single agent olaratumab [name] (12/27/2015 -02/08/2016).  She required GCSF secondary to neutropenia.  PET scan on 02/26/2016 revealed progression of numerous pulmonary nodules/metastasis and mild progression (1.2 cm with SUV 7) of right pelvic nodal metastasis.  In the right lung apex, there was a 1.3 x 1.0 cm (SUV 6.2) previously 0.9 x 0.6 cm.  In the anterior left lung was a 2.0 x 2.2 cm (SUV 12.4) previously 1.1 x 0.9 cm.  In the anterior right middle lobe was a 1.1 x 1.1 cm (SUV 2.9), previously 0.9 x  0.9 cm.  The subpleural mass along the anterior of the left upper lobe was 2.1 x 3.5 cm (not FDG avid), previously 2.2 x 3.4 cm.  Bone scan on 03/05/2016 revealed no evidence of metastatic disease.  She received 5 cycles of trabectedin (Yondelis) (03/13/2016 - 07/02/2016) with Neulasta support.  She developed rhabdomyolysis with cycle #5 (CPK 5000).    Chest CT on 04/28/2016 revealed innumerable pulmonary metastases which had slightly increased (59m) in size since 02/26/2016.  PET scan on 08/01/2016 revealed that most of the pulmonary nodules were smaller and demonstrated decrease in metabolic activity. However, the largest lesion in the left upper lobe had increased in metabolic activity (SUV max increased from 12.4 to 17.4) but was fairly similar in size (2.6 cm now compared to 2.3 cm).  There was no mediastinal or hilar hypermetabolic adenopathy.  The right pelvic side wais  stable in size (SUV max decreased from 6.9 to 2.9).  There was no new abdominal or pelvic lymphadenopathy.  Chest, abdomen, and pelvic CT scan on 11/25/2016 revealed an interval increase in the size of the dominant mass in the left upper lobe (4.2 x 2.9 cm). There was interval increase in size of innumerable bilateral pulmonary nodules. There was interval increase in the size of the right hilar lymph node (1.5 cm). There was interval increase in the size of hepatic hypodensities (nodules 8-9 mm) highly concerning for hepatic metastasis. There was a stable small pelvic sidewall lesion (1.1 cm).  She is s/p cycle #1 decarbazine (11/28/2016) with Neulasta support.  Counts were good with cycle #1 (1000 mg/m2).  Anemia work-up on 07/28/2016 revealed the following normal labs: ferritin (529), iron saturation (26%), TIBC (287), B12, and folate.  Symptomatically, she feels good.  She has right shoulder pain.  She is on Levaquin for a sinus infection.  Exam is stable.  She has hypokalemia.  Plan: 1.  Labs today:  CBC with diff, CMP, Mg. 2.  Cycle #2 decarbazine with OnPro Neulasta.  Discuss increasing dose to 1100 mg/m2. 3.  Potassium chloride 20 meq IV. 4.  [facility] lab on 01/05/2017 for CBC with diff. 5.  Chest, abdomen, and pelvic CT scan on 01/12/2017. 6.  Bone scan on 01/12/2017. 7.  Rx: oxycodone 5/acetaminophen 325 1 tablet po q 6 hours prn pain; dis: # 30. 8.  RTC in 3 weeks for MD assessment, labs (CBC with diff, CMP, Mg), review of imaging, and cycle #3 dacarbazine with OnPro Neulasta.   MLequita Asal MD 12/25/2016, 9:52 AM

## 2016-12-25 ENCOUNTER — Inpatient Hospital Stay: Payer: 59

## 2016-12-25 ENCOUNTER — Encounter: Payer: Self-pay | Admitting: Hematology and Oncology

## 2016-12-25 ENCOUNTER — Telehealth: Payer: Self-pay | Admitting: *Deleted

## 2016-12-25 ENCOUNTER — Inpatient Hospital Stay (HOSPITAL_BASED_OUTPATIENT_CLINIC_OR_DEPARTMENT_OTHER): Payer: 59 | Admitting: Hematology and Oncology

## 2016-12-25 VITALS — BP 134/91 | HR 103 | Temp 96.1°F | Resp 18 | Wt 149.5 lb

## 2016-12-25 DIAGNOSIS — Z5111 Encounter for antineoplastic chemotherapy: Secondary | ICD-10-CM

## 2016-12-25 DIAGNOSIS — C787 Secondary malignant neoplasm of liver and intrahepatic bile duct: Secondary | ICD-10-CM

## 2016-12-25 DIAGNOSIS — C7802 Secondary malignant neoplasm of left lung: Secondary | ICD-10-CM

## 2016-12-25 DIAGNOSIS — M6282 Rhabdomyolysis: Secondary | ICD-10-CM

## 2016-12-25 DIAGNOSIS — C7800 Secondary malignant neoplasm of unspecified lung: Secondary | ICD-10-CM

## 2016-12-25 DIAGNOSIS — C7801 Secondary malignant neoplasm of right lung: Secondary | ICD-10-CM

## 2016-12-25 DIAGNOSIS — Z7689 Persons encountering health services in other specified circumstances: Secondary | ICD-10-CM

## 2016-12-25 DIAGNOSIS — C55 Malignant neoplasm of uterus, part unspecified: Secondary | ICD-10-CM | POA: Diagnosis not present

## 2016-12-25 DIAGNOSIS — J069 Acute upper respiratory infection, unspecified: Secondary | ICD-10-CM

## 2016-12-25 DIAGNOSIS — Z79899 Other long term (current) drug therapy: Secondary | ICD-10-CM

## 2016-12-25 DIAGNOSIS — C782 Secondary malignant neoplasm of pleura: Secondary | ICD-10-CM

## 2016-12-25 DIAGNOSIS — Z7901 Long term (current) use of anticoagulants: Secondary | ICD-10-CM

## 2016-12-25 DIAGNOSIS — C775 Secondary and unspecified malignant neoplasm of intrapelvic lymph nodes: Secondary | ICD-10-CM

## 2016-12-25 DIAGNOSIS — E876 Hypokalemia: Secondary | ICD-10-CM

## 2016-12-25 DIAGNOSIS — M25511 Pain in right shoulder: Secondary | ICD-10-CM

## 2016-12-25 DIAGNOSIS — Z86718 Personal history of other venous thrombosis and embolism: Secondary | ICD-10-CM

## 2016-12-25 LAB — MAGNESIUM: Magnesium: 1.7 mg/dL (ref 1.7–2.4)

## 2016-12-25 LAB — CBC WITH DIFFERENTIAL/PLATELET
Basophils Absolute: 0 10*3/uL (ref 0–0.1)
Basophils Relative: 1 %
Eosinophils Absolute: 0.1 10*3/uL (ref 0–0.7)
Eosinophils Relative: 2 %
HCT: 34.2 % — ABNORMAL LOW (ref 35.0–47.0)
Hemoglobin: 11.7 g/dL — ABNORMAL LOW (ref 12.0–16.0)
Lymphocytes Relative: 24 %
Lymphs Abs: 0.8 10*3/uL — ABNORMAL LOW (ref 1.0–3.6)
MCH: 31.2 pg (ref 26.0–34.0)
MCHC: 34.2 g/dL (ref 32.0–36.0)
MCV: 91.1 fL (ref 80.0–100.0)
Monocytes Absolute: 0.5 10*3/uL (ref 0.2–0.9)
Monocytes Relative: 14 %
Neutro Abs: 2 10*3/uL (ref 1.4–6.5)
Neutrophils Relative %: 59 %
Platelets: 292 10*3/uL (ref 150–440)
RBC: 3.75 MIL/uL — ABNORMAL LOW (ref 3.80–5.20)
RDW: 17.2 % — ABNORMAL HIGH (ref 11.5–14.5)
WBC: 3.4 10*3/uL — ABNORMAL LOW (ref 3.6–11.0)

## 2016-12-25 LAB — COMPREHENSIVE METABOLIC PANEL
ALT: 57 U/L — ABNORMAL HIGH (ref 14–54)
AST: 63 U/L — ABNORMAL HIGH (ref 15–41)
Albumin: 3.8 g/dL (ref 3.5–5.0)
Alkaline Phosphatase: 158 U/L — ABNORMAL HIGH (ref 38–126)
Anion gap: 4 — ABNORMAL LOW (ref 5–15)
BUN: 11 mg/dL (ref 6–20)
CO2: 27 mmol/L (ref 22–32)
Calcium: 9.2 mg/dL (ref 8.9–10.3)
Chloride: 106 mmol/L (ref 101–111)
Creatinine, Ser: 0.64 mg/dL (ref 0.44–1.00)
GFR calc Af Amer: >60 mL/min (ref >60)
GFR calc non Af Amer: >60 mL/min (ref >60)
Glucose, Bld: 105 mg/dL — ABNORMAL HIGH (ref 65–99)
Potassium: 3.2 mmol/L — ABNORMAL LOW (ref 3.5–5.1)
Sodium: 137 mmol/L (ref 135–145)
Total Bilirubin: 0.6 mg/dL (ref 0.3–1.2)
Total Protein: 7.6 g/dL (ref 6.5–8.1)

## 2016-12-25 MED ORDER — SODIUM CHLORIDE 0.9 % IV SOLN
Freq: Once | INTRAVENOUS | Status: AC
  Administered 2016-12-25: 12:00:00 via INTRAVENOUS
  Filled 2016-12-25: qty 5

## 2016-12-25 MED ORDER — DACARBAZINE 200 MG IV SOLR
1100.0000 mg/m2 | Freq: Once | INTRAVENOUS | Status: AC
  Administered 2016-12-25: 1960 mg via INTRAVENOUS
  Filled 2016-12-25: qty 98

## 2016-12-25 MED ORDER — POTASSIUM CHLORIDE 2 MEQ/ML IV SOLN
20.0000 meq | Freq: Once | INTRAVENOUS | Status: AC
  Administered 2016-12-25: 20 meq via INTRAVENOUS
  Filled 2016-12-25: qty 100

## 2016-12-25 MED ORDER — PALONOSETRON HCL INJECTION 0.25 MG/5ML
0.2500 mg | Freq: Once | INTRAVENOUS | Status: AC
  Administered 2016-12-25: 0.25 mg via INTRAVENOUS
  Filled 2016-12-25: qty 5

## 2016-12-25 MED ORDER — SODIUM CHLORIDE 0.9 % IV SOLN
INTRAVENOUS | Status: DC
  Administered 2016-12-25: 11:00:00 via INTRAVENOUS
  Filled 2016-12-25: qty 1000

## 2016-12-25 MED ORDER — OXYCODONE-ACETAMINOPHEN 5-325 MG PO TABS: 1.0000 | ORAL_TABLET | Freq: Four times a day (QID) | ORAL | 0 refills | Status: DC | PRN

## 2016-12-25 MED ORDER — PEGFILGRASTIM 6 MG/0.6ML ~~LOC~~ PSKT
6.0000 mg | PREFILLED_SYRINGE | Freq: Once | SUBCUTANEOUS | Status: AC
  Administered 2016-12-25: 6 mg via SUBCUTANEOUS

## 2016-12-25 MED ORDER — POTASSIUM CHLORIDE 20 MEQ/100ML IV SOLN: 20.0000 meq | Freq: Once | INTRAVENOUS | Status: DC

## 2016-12-25 MED ORDER — HEPARIN SOD (PORK) LOCK FLUSH 100 UNIT/ML IV SOLN
500.0000 [IU] | Freq: Once | INTRAVENOUS | Status: AC | PRN
  Administered 2016-12-25: 500 [IU]
  Filled 2016-12-25: qty 5

## 2016-12-25 NOTE — Telephone Encounter (Signed)
-----   Message from Lequita Asal, MD sent at 12/25/2016  1:30 PM EST ----- Regarding: Please call patient  Ensure she takes her Decadron post chemo.  She should take potassium starting tomorrow- 10 meq BID x 2 days  Let's add a BMP to her [facility] slip in 10 days.  M  ----- Message ----- From: Interface, Lab In [place] Sent: 12/25/2016   9:16 AM To: Lequita Asal, MD

## 2016-12-25 NOTE — Telephone Encounter (Signed)
Called patient to inform her per VO of MD that she should take her potassium 10 meq twice a day for 2 days.  Also had her check BMP on her [facility] form for her next lab draw.  Verbalized understanding.

## 2016-12-25 NOTE — Progress Notes (Signed)
Patient went to walk in clinic last Sunday for sinus infection.  On levofloxacin 500 mg.  Patient states she thinks she pulled a muscle in her right shoulder while coughing.  Asking if she can get oxycodone/acetaminophen 5-325.  She had a prescription but it has expired.

## 2017-01-06 ENCOUNTER — Encounter: Payer: Self-pay | Admitting: Hematology and Oncology

## 2017-01-12 ENCOUNTER — Other Ambulatory Visit: Payer: 59

## 2017-01-12 ENCOUNTER — Ambulatory Visit: Admission: RE | Admit: 2017-01-12 | Payer: 59 | Source: Ambulatory Visit

## 2017-01-12 DIAGNOSIS — M25511 Pain in right shoulder: Secondary | ICD-10-CM | POA: Insufficient documentation

## 2017-01-12 DIAGNOSIS — Z5111 Encounter for antineoplastic chemotherapy: Secondary | ICD-10-CM | POA: Insufficient documentation

## 2017-01-13 ENCOUNTER — Inpatient Hospital Stay: Admit: 2017-01-13 | Payer: Self-pay

## 2017-01-13 ENCOUNTER — Ambulatory Visit
Admission: RE | Admit: 2017-01-13 | Discharge: 2017-01-13 | Disposition: A | Discharge status: Home / Self Care | Payer: 59 | Source: Ambulatory Visit | Attending: Hematology and Oncology | Admitting: Hematology and Oncology

## 2017-01-13 ENCOUNTER — Other Ambulatory Visit: Payer: 59

## 2017-01-13 DIAGNOSIS — C55 Malignant neoplasm of uterus, part unspecified: Secondary | ICD-10-CM | POA: Insufficient documentation

## 2017-01-13 DIAGNOSIS — R918 Other nonspecific abnormal finding of lung field: Secondary | ICD-10-CM | POA: Insufficient documentation

## 2017-01-13 DIAGNOSIS — C7800 Secondary malignant neoplasm of unspecified lung: Secondary | ICD-10-CM | POA: Insufficient documentation

## 2017-01-13 DIAGNOSIS — C787 Secondary malignant neoplasm of liver and intrahepatic bile duct: Secondary | ICD-10-CM | POA: Diagnosis not present

## 2017-01-13 MED ORDER — TECHNETIUM TC 99M MEDRONATE IV KIT
25.0000 | PACK | Freq: Once | INTRAVENOUS | Status: AC | PRN
  Administered 2017-01-13: 21.748 via INTRAVENOUS

## 2017-01-13 MED ORDER — IOPAMIDOL (ISOVUE-300) INJECTION 61%
100.0000 mL | Freq: Once | INTRAVENOUS | Status: AC | PRN
  Administered 2017-01-13: 100 mL via INTRAVENOUS

## 2017-01-14 ENCOUNTER — Ambulatory Visit: Admission: RE | Admit: 2017-01-14 | Payer: 59 | Source: Ambulatory Visit

## 2017-01-14 ENCOUNTER — Other Ambulatory Visit: Payer: 59

## 2017-01-15 ENCOUNTER — Inpatient Hospital Stay: Payer: 59

## 2017-01-15 ENCOUNTER — Inpatient Hospital Stay: Payer: 59 | Admitting: Hematology and Oncology

## 2017-01-15 ENCOUNTER — Other Ambulatory Visit: Payer: 59

## 2017-01-16 ENCOUNTER — Inpatient Hospital Stay: Payer: 59

## 2017-01-16 ENCOUNTER — Inpatient Hospital Stay: Payer: 59 | Attending: Hematology and Oncology | Admitting: Hematology and Oncology

## 2017-01-16 ENCOUNTER — Encounter: Payer: Self-pay | Admitting: Hematology and Oncology

## 2017-01-16 ENCOUNTER — Other Ambulatory Visit: Payer: Self-pay | Admitting: *Deleted

## 2017-01-16 VITALS — BP 138/81 | HR 111 | Temp 97.4°F | Resp 20 | Wt 157.4 lb

## 2017-01-16 DIAGNOSIS — Z7901 Long term (current) use of anticoagulants: Secondary | ICD-10-CM | POA: Insufficient documentation

## 2017-01-16 DIAGNOSIS — Z79899 Other long term (current) drug therapy: Secondary | ICD-10-CM | POA: Insufficient documentation

## 2017-01-16 DIAGNOSIS — E876 Hypokalemia: Secondary | ICD-10-CM

## 2017-01-16 DIAGNOSIS — Z86718 Personal history of other venous thrombosis and embolism: Secondary | ICD-10-CM | POA: Diagnosis not present

## 2017-01-16 DIAGNOSIS — C7971 Secondary malignant neoplasm of right adrenal gland: Secondary | ICD-10-CM | POA: Insufficient documentation

## 2017-01-16 DIAGNOSIS — C7800 Secondary malignant neoplasm of unspecified lung: Secondary | ICD-10-CM

## 2017-01-16 DIAGNOSIS — C7802 Secondary malignant neoplasm of left lung: Secondary | ICD-10-CM | POA: Diagnosis not present

## 2017-01-16 DIAGNOSIS — C782 Secondary malignant neoplasm of pleura: Secondary | ICD-10-CM

## 2017-01-16 DIAGNOSIS — C55 Malignant neoplasm of uterus, part unspecified: Secondary | ICD-10-CM | POA: Diagnosis present

## 2017-01-16 DIAGNOSIS — M6282 Rhabdomyolysis: Secondary | ICD-10-CM

## 2017-01-16 DIAGNOSIS — C775 Secondary and unspecified malignant neoplasm of intrapelvic lymph nodes: Secondary | ICD-10-CM | POA: Diagnosis not present

## 2017-01-16 DIAGNOSIS — C787 Secondary malignant neoplasm of liver and intrahepatic bile duct: Secondary | ICD-10-CM

## 2017-01-16 DIAGNOSIS — C7801 Secondary malignant neoplasm of right lung: Secondary | ICD-10-CM | POA: Diagnosis not present

## 2017-01-16 LAB — CBC WITH DIFFERENTIAL/PLATELET
Basophils Absolute: 0 10*3/uL (ref 0–0.1)
Basophils Relative: 1 %
Eosinophils Absolute: 0.3 10*3/uL (ref 0–0.7)
Eosinophils Relative: 7 %
HCT: 34 % — ABNORMAL LOW (ref 35.0–47.0)
Hemoglobin: 11.8 g/dL — ABNORMAL LOW (ref 12.0–16.0)
Lymphocytes Relative: 25 %
Lymphs Abs: 1 10*3/uL (ref 1.0–3.6)
MCH: 32.7 pg (ref 26.0–34.0)
MCHC: 34.6 g/dL (ref 32.0–36.0)
MCV: 94.5 fL (ref 80.0–100.0)
Monocytes Absolute: 0.4 10*3/uL (ref 0.2–0.9)
Monocytes Relative: 9 %
Neutro Abs: 2.3 10*3/uL (ref 1.4–6.5)
Neutrophils Relative %: 58 %
Platelets: 253 10*3/uL (ref 150–440)
RBC: 3.6 MIL/uL — ABNORMAL LOW (ref 3.80–5.20)
RDW: 19.4 % — ABNORMAL HIGH (ref 11.5–14.5)
WBC: 4.1 10*3/uL (ref 3.6–11.0)

## 2017-01-16 LAB — COMPREHENSIVE METABOLIC PANEL
ALT: 89 U/L — ABNORMAL HIGH (ref 14–54)
AST: 85 U/L — ABNORMAL HIGH (ref 15–41)
Albumin: 3.7 g/dL (ref 3.5–5.0)
Alkaline Phosphatase: 214 U/L — ABNORMAL HIGH (ref 38–126)
Anion gap: 9 (ref 5–15)
BUN: 10 mg/dL (ref 6–20)
CO2: 24 mmol/L (ref 22–32)
Calcium: 9.5 mg/dL (ref 8.9–10.3)
Chloride: 104 mmol/L (ref 101–111)
Creatinine, Ser: 0.76 mg/dL (ref 0.44–1.00)
GFR calc Af Amer: >60 mL/min (ref >60)
GFR calc non Af Amer: >60 mL/min (ref >60)
Glucose, Bld: 138 mg/dL — ABNORMAL HIGH (ref 65–99)
Potassium: 3.2 mmol/L — ABNORMAL LOW (ref 3.5–5.1)
Sodium: 137 mmol/L (ref 135–145)
Total Bilirubin: 0.5 mg/dL (ref 0.3–1.2)
Total Protein: 7.3 g/dL (ref 6.5–8.1)

## 2017-01-16 LAB — MAGNESIUM: Magnesium: 1.7 mg/dL (ref 1.7–2.4)

## 2017-01-16 MED ORDER — SODIUM CHLORIDE 0.9% FLUSH
10.0000 mL | INTRAVENOUS | Status: DC | PRN
  Administered 2017-01-16: 10 mL via INTRAVENOUS
  Filled 2017-01-16: qty 10

## 2017-01-16 MED ORDER — RIVAROXABAN 20 MG PO TABS: ORAL_TABLET | ORAL | 3 refills | Status: AC

## 2017-01-16 MED ORDER — HEPARIN SOD (PORK) LOCK FLUSH 100 UNIT/ML IV SOLN
500.0000 [IU] | Freq: Once | INTRAVENOUS | Status: AC
  Administered 2017-01-16: 500 [IU] via INTRAVENOUS
  Filled 2017-01-16: qty 5

## 2017-01-16 MED ORDER — POTASSIUM CHLORIDE ER 10 MEQ PO TBCR: 10.0000 meq | EXTENDED_RELEASE_TABLET | Freq: Every day | ORAL | 0 refills | Status: DC

## 2017-01-16 NOTE — Progress Notes (Signed)
Patient states she went to ENT about 2 weeks ago because her ears were bothering her.  States she also has a cough.  ENT visit negative.  Patient requesting refill for Xarelto.

## 2017-01-16 NOTE — Progress Notes (Signed)
[facility]-  Cancer Center  Clinic day:  01/16/2017   Chief Complaint: Cassidy Bradley is an 56 y.o. female with metastatic uterine leiomyosarcoma who is seen for review of interval scans and assessment prior to cycle #3 dacarbazine.  HPI: The patient was last seen in the medical oncology clinic on 12/25/2016.  At that time, she was had right shoulder symptoms.  She required pain medications.  She received cycle #2 dacarbazine.  Decision was made for follow-up imaging studies.  Chest, abdomen, and pelvic CT scan on 01/13/2017 revealed progression of metastatic disease with interval increase in number and size of numerous pulmonary nodules and masses bilaterally, slight increase in size of numerous hepatic metastasis, new 1.7 cm right adrenal metastasis, and probable 1.1 cm pancreatic metastasis in the proximal body of the pancreas.  The largest pulmonary nodule measured 6.1 cm x 3.8 cm in the anterior medial left upper lobe which appeared to invade the anterior chest wall.  Bone scan on 01/13/2017 was normal.  Symptomatically, she states that her right shoulder is better.  Her left side hurts.  She has had a persistent cough for weeks.  She has not required pain medications.   Past Medical History:  Diagnosis Date  . Breast lump 2006    both breast   . Cancer (HCC)    Uterine Leiomyosarcoma  . Chest wall mass   . DVT (deep venous thrombosis) (HCC)    Left leg  . Uterine cancer (HCC) 01/2015   chemo    Past Surgical History:  Procedure Laterality Date  . ABDOMINAL HYSTERECTOMY  01/30/15  . ANKLE SURGERY  2005  . BACK SURGERY  2005  . BREAST BIOPSY Bilateral 10 + yrs ago   neg  . CESAREAN SECTION  1993  . CHOLECYSTECTOMY  2001  . COLONOSCOPY     Family History  Problem Relation Age of Onset  . Colon polyps Mother   . Lupus Father   . Esophageal cancer Maternal Uncle   . Breast cancer Sister 50    fraternal twin sister  . Breast cancer Maternal Aunt    . Breast cancer Maternal Grandmother 60  . Stomach cancer Maternal Aunt   . Lung cancer Maternal Grandfather   . Cancer - Colon Cousin 25    first cousin    Social History:  reports that she has never smoked. She has never used smokeless tobacco. She reports that she does not drink alcohol or use drugs.   She lives in [place].  The patient is accompanied by her husband today.  Allergies:  Allergies  Allergen Reactions  . Pravastatin Hives and Nausea And Vomiting  . Sulfa Antibiotics Hives and Swelling  . Latex Rash  . Other Rash and Other (See Comments)    Pt states that she is allergic to spandex.   Current Medications: Current Outpatient Prescriptions  Medication Sig Dispense Refill  . dexamethasone (DECADRON) 4 MG tablet Take 2 tablets by mouth once a day on the day after chemotherapy and then take 2 tablets two times a day for 2 days. Take with food. 30 tablet 1  . ferrous sulfate 325 (65 FE) MG tablet Take 1 tablet (325 mg total) by mouth 2 (two) times daily with a meal. 60 tablet 0  . levofloxacin (LEVAQUIN) 500 MG tablet     . lidocaine-prilocaine (EMLA) cream Apply 1 application topically as needed. Apply one hour prior to chemotherapy (Patient taking differently: Apply 1 application topically as needed (prior  to accessing pts port). Pt applies one hour prior to chemotherapy.) 30 g 1  . loratadine (CLARITIN) 10 MG tablet Take 10 mg by mouth daily. Reported on 07/02/2016    . Multiple Vitamins-Minerals (ALIVE WOMENS ENERGY) TABS Take 1 tablet by mouth daily.     . ondansetron (ZOFRAN) 8 MG tablet Take 8 mg by mouth every 8 (eight) hours as needed for nausea or vomiting. Reported on 06/26/2016    . oxyCODONE-acetaminophen (PERCOCET/ROXICET) 5-325 MG tablet Take 1 tablet by mouth every 6 (six) hours as needed for severe pain. 30 tablet 0  . polyethylene glycol powder (GLYCOLAX/MIRALAX) powder Take 1 Container by mouth daily as needed. Reported on 06/26/2016    . rivaroxaban  (XARELTO) 20 MG TABS tablet Take 1 tablet by mouth  daily with supper 90 tablet 3  . azithromycin (ZITHROMAX) 250 MG tablet Take 2 tabs on day one and 1 tablet daily days 2 - 5 (Patient not taking: Reported on 12/25/2016) 6 each 0  . potassium chloride (K-DUR) 10 MEQ tablet Take 1 tablet (10 mEq total) by mouth 2 (two) times daily. For 2 days (Patient not taking: Reported on 12/25/2016) 20 tablet 0  . prochlorperazine (COMPAZINE) 10 MG tablet Take 1 tablet (10 mg total) by mouth every 6 (six) hours as needed (Nausea or vomiting). (Patient not taking: Reported on 12/25/2016) 30 tablet 1   No current facility-administered medications for this visit.    Facility-Administered Medications Ordered in Other Visits  Medication Dose Route Frequency Provider Last Rate Last Dose  . 0.9 %  sodium chloride infusion   Intravenous Continuous Lequita Asal, MD   Stopped at 05/01/16 1340  . 0.9 %  sodium chloride infusion   Intravenous Continuous Lequita Asal, MD   Stopped at 06/05/16 1235  . heparin lock flush 100 unit/mL  500 Units Intracatheter Once PRN Lequita Asal, MD      . heparin lock flush 100 unit/mL  500 Units Intracatheter Once PRN Lequita Asal, MD      . heparin lock flush 100 unit/mL  500 Units Intracatheter Once PRN Lequita Asal, MD      . heparin lock flush 100 unit/mL  500 Units Intracatheter Once PRN Lequita Asal, MD      . heparin lock flush 100 unit/mL  500 Units Intravenous Once Lequita Asal, MD      . sodium chloride 0.9 % injection 10 mL  10 mL Intracatheter PRN Lequita Asal, MD      . sodium chloride flush (NS) 0.9 % injection 10 mL  10 mL Intracatheter PRN Lequita Asal, MD   10 mL at 04/03/16 1241  . sodium chloride flush (NS) 0.9 % injection 10 mL  10 mL Intracatheter PRN Lequita Asal, MD   10 mL at 05/01/16 1225  . sodium chloride flush (NS) 0.9 % injection 10 mL  10 mL Intracatheter PRN Lequita Asal, MD   10 mL at 06/05/16  1135  . sodium chloride flush (NS) 0.9 % injection 10 mL  10 mL Intracatheter PRN Lequita Asal, MD   10 mL at 07/02/16 1050  . sodium chloride flush (NS) 0.9 % injection 10 mL  10 mL Intravenous PRN Lequita Asal, MD   10 mL at 08/29/16 1449  . sodium chloride flush (NS) 0.9 % injection 10 mL  10 mL Intravenous PRN Lequita Asal, MD   10 mL at 01/16/17 1116  Review of Systems:  GENERAL:  Feels "ok".  No fevers or sweats. Weight up 8 pounds. PERFORMANCE STATUS (ECOG):  2 HEENT:  No visual changes,sore throat, mouth sores or tenderness. Lungs:   No shortness of breath. Chronic cough.  No hemoptysis. Cardiac:  No chest pain, palpitations, orthopnea, or PND. GI:   No nausea, vomiting, constipation, melena, or hematochezia. GU:  No urgency, frequency, dysuria, or hematuria. Musculoskeletal:  Right shoulder, better.  Left sided chest wall pain.  No back pain. Extremities: Mild left lower extremity swelling, stable.  No pain. Skin:  No rashes or skin changes. Neuro:  No headache, numbness or weakness, balance or coordination issues. Endocrine:  No diabetes, thyroid issues, hot flashes or night sweats. Psych:  No mood changes, depression or anxiety. Pain:  Left sided chest wall pain. Review of systems:  All other systems reviewed and found to be negative.                        Physical Exam: Blood pressure 138/81, pulse (!) 111, temperature 97.4 F (36.3 C), temperature source Tympanic, resp. rate (!) 8, weight 157 lb 6.5 oz (71.4 kg).  GENERAL:  Well developed, well nourished, woman sitting comfortably in the exam room in no acute distress. MENTAL STATUS:  Alert and oriented to person, place and time. HEAD:  Wearing a black cap.  Curly gray hair.  Normocephalic, atraumatic, face symmetric, no Cushingoid features. EYES:  Glasses.  Brown eyes.  Pupils equal round and reactive to light and accomodation.  No conjunctivitis or scleral icterus. ENT:  Oropharynx clear without  lesion.  Tongue normal. Mucous membranes moist.  RESPIRATORY:  Clear to auscultation without rales, wheezes or rhonchi. CARDIOVASCULAR:  Regular rate and rhythm without murmur, rub or gallop. No JVD. ABDOMEN:  Soft, non-tender, with active bowel sounds, and no hepatosplenomegaly.  No masses. SKIN:  No rashes, ulcers or lesions. EXTREMITIES:  1+ left lower extremity edema below the knee (stable).  LYMPH NODES:  No palpable cervical, supraclavicular, axillary or inguinal adenopathy  NEUROLOGICAL: Appropriate. PSYCH:  Appropriate.  Laboratory testing: [facility] labs on 02/28/2016 included a hematocrit 36.1, hemoglobin 12.1, platelets 206,000, white count 13,200 with an [place] of 11,400.  Comprehensive metabolic panel included a creatinine of 0.67. Liver function tests included an alkaline phosphatase of 122 (39-117).    [facility] labs on 03/12/2016 included a comprehensive metabolic panel included a creatinine of 0.57.  Liver function tests included an alkaline phosphatase of 118 (39-117) post Neupogen.  Phosphorus was 3.4.  Creatinine kinase was 156 (24-173).  [facility] labs on 04/02/2016 included hematocrit of 33.3, hemoglobin 11.0, platelets 229,000, WBC 2700 with an ANC of 1400.  Comprehensive metabolic panel included a creatinine of 0.61.  Liver function tests included an alkaline phosphatase of 129 (39-117), AST 54 (0-40) and ALT 76 (0-32).  Phosphorus was 3.9.  Creatinine kinase was 120 (24-173).  [facility] labs on 04/23/2016 included hematocrit of 30.4, hemoglobin 10.0, platelets 266,000, WBC 3200 with an ANC of 1600.  Comprehensive metabolic panel included a creatinine of 0.68.  Liver function tests included an alkaline phosphatase of 195 (39-117), AST 45 (0-40) and ALT 69 (0-32).  Phosphorus was 3.2.  Creatinine kinase was 258 (24-173).  [facility] labs on 04/30/2016 included hematocrit of 32.0, hemoglobin 10.8, platelets 287,000, WBC 2100 with an ANC of 900.  Comprehensive metabolic panel included a  creatinine of 0.68.  Liver function tests included an alkaline phosphatase of 166 (39-117), AST 42 (0-40) and  ALT 44 (0-32).  Phosphorus was 4.0.  Creatinine kinase was 182 (24-173).  [facility] labs on 05/21/2016 included hematocrit of 28.9, hemoglobin 9.4, platelets 167,000, WBC 2500 with an ANC of 1,100.  Comprehensive metabolic panel included a creatinine of 0.68.  Liver function tests included an alkaline phosphatase of 244 (39-117), AST 63 (0-40) and ALT 86 (0-32).  Phosphorus was 4.0.  Creatinine kinase was 433 (24-173).  [facility] labs on 06/04/2016 included hematocrit of 32.7, hemoglobin 10.6, platelets 287,000, WBC 2800 with an ANC of 1,300. Comprehensive metabolic panel included a creatinine of 0.68. Liver function tests included an alkaline phosphatase of 241 (39-117), AST 64 (0-40) and ALT 67 (0-32). Phosphorus was 4.0. Creatinine kinase was 229 (24-173).  [facility] labs on 06/25/2016 included hematocrit of 28.1, hemoglobin 9.3, platelets 155,000, WBC 2500 with an ANC of 1,000.  Comprehensive metabolic panel included a creatinine of 0.62.  Liver function tests included an alkaline phosphatase of 240 (39-117), AST 72 (0-40) and ALT 80 (0-32).  Phosphorus was 3.6.  Creatinine kinase was 8.6 (0-5.3).   Infusion on 01/16/2017  Component Date Value Ref Range Status  . WBC 01/16/2017 4.1  3.6 - 11.0 K/uL Final  . RBC 01/16/2017 3.60* 3.80 - 5.20 MIL/uL Final  . Hemoglobin 01/16/2017 11.8* 12.0 - 16.0 g/dL Final  . HCT 01/16/2017 34.0* 35.0 - 47.0 % Final  . MCV 01/16/2017 94.5  80.0 - 100.0 fL Final  . MCH 01/16/2017 32.7  26.0 - 34.0 pg Final  . MCHC 01/16/2017 34.6  32.0 - 36.0 g/dL Final  . RDW 01/16/2017 19.4* 11.5 - 14.5 % Final  . Platelets 01/16/2017 253  150 - 440 K/uL Final  . Neutrophils Relative % 01/16/2017 58  % Final  . Neutro Abs 01/16/2017 2.3  1.4 - 6.5 K/uL Final  . Lymphocytes Relative 01/16/2017 25  % Final  . Lymphs Abs 01/16/2017 1.0  1.0 - 3.6 K/uL Final  .  Monocytes Relative 01/16/2017 9  % Final  . Monocytes Absolute 01/16/2017 0.4  0.2 - 0.9 K/uL Final  . Eosinophils Relative 01/16/2017 7  % Final  . Eosinophils Absolute 01/16/2017 0.3  0 - 0.7 K/uL Final  . Basophils Relative 01/16/2017 1  % Final  . Basophils Absolute 01/16/2017 0.0  0 - 0.1 K/uL Final  . Sodium 01/16/2017 137  135 - 145 mmol/L Final  . Potassium 01/16/2017 3.2* 3.5 - 5.1 mmol/L Final  . Chloride 01/16/2017 104  101 - 111 mmol/L Final  . CO2 01/16/2017 24  22 - 32 mmol/L Final  . Glucose, Bld 01/16/2017 138* 65 - 99 mg/dL Final  . BUN 01/16/2017 10  6 - 20 mg/dL Final  . Creatinine, Ser 01/16/2017 0.76  0.44 - 1.00 mg/dL Final  . Calcium 01/16/2017 9.5  8.9 - 10.3 mg/dL Final  . Total Protein 01/16/2017 7.3  6.5 - 8.1 g/dL Final  . Albumin 01/16/2017 3.7  3.5 - 5.0 g/dL Final  . AST 01/16/2017 85* 15 - 41 U/L Final  . ALT 01/16/2017 89* 14 - 54 U/L Final  . Alkaline Phosphatase 01/16/2017 214* 38 - 126 U/L Final  . Total Bilirubin 01/16/2017 0.5  0.3 - 1.2 mg/dL Final  . GFR calc non Af Amer 01/16/2017 >60  >60 mL/min Final  . GFR calc Af Amer 01/16/2017 >60  >60 mL/min Final   Comment: (NOTE) The eGFR has been calculated using the CKD EPI equation. This calculation has not been validated in all clinical situations. eGFR's persistently <60  mL/min signify possible Chronic Kidney Disease.   . Anion gap 01/16/2017 9  5 - 15 Final  . Magnesium 01/16/2017 1.7  1.7 - 2.4 mg/dL Final   Assessment:  Cassidy Bradley is a 56 y.o. female 56 y.o. female with stage IV uterine leiomyosarcoma. She underwent TAH/BSO on 01/30/2015. Pathology confirmed high grade leiomyosarcoma with 2 large adjacent tumor nodules (10 cm and 5 cm) and a separate 0.5 cm serosal nodule.   Chest, abdomen, and pelvic CT scan on 02/12/2015 revealed a 1.7 x 2.3 cm right external iliac node. Pulmonary nodules were scattered throughout the lungs bilaterally. There was left internal mammary chain  adenopathy measuring up to 3.6 x 2 cm.  She underwent anterior chest wall pleural-based CT guided biopsy on 02/21/2015. Pathology was consistent with metastatic leiomyosarcoma. Bone scan on 02/28/2015 revealed no evidence of metastatic disease with indeterminate uptake in the left posterior rib.   She has a fraternal twin who developed breast cancer at age 47. Several other family members have malignancies. 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  Prechemotherapy labs from [facility] revealed leukopenia (WBC 2300 with ANC 900). Work-up to date is negative. She has a family history of leukopenia. Bone marrow on 03/08/2015 revealed no evidence of neoplasia or metastatic disease. Marrow was normocellular for age (30-50%) with trilineage hematopoiesis. There was no increase in marrow reticulin fibers. Flow cytometry and cytogenetics were normal.  She received 4 cycles of gemcitabine and Taxotere (03/20/2015 - 05/22/2015) with Neulasta support. She received gemcitabine alone on 05/22/2015.  She was admitted on 05/24/2015 with an acute febrile illness. All cultures were negative.   Chest CT on 05/25/2015 revealed early progressive disease. Abdominal and pelvic CT scan on 05/30/2015 revealed stable to slightly increased right external iliac lymph node (1.8 x 2.5 cm).   Echocardiogram on 06/18/2015 revealed EF of 60-65%.  Echo on 10/22/2015 revealed an EF of 65%.  MUGA on 12/25/2015 revealed an EF of 52%.  Echo on 03/03/2016 revealed an EF of 50%.  Echo on 05/21/2016 revealed an EF of 55%.  Echo on 07/21/2016 revealed an EF of 45-50%.  Bilateral lower extremity duplex on 05/30/2015 was negative.  Left upper extremity duplex on 06/08/2015 was negative.  Left lower extremity duplex on 07/19/2015 documented a DVT in the popliteal vein.   Left lower extremity duplex on 01/31/2016 revealed re-cannulization of the prior DVT  involving the left popliteal vein.  She is on Xarelto.   She received 8 cycles of adriamycin (06/07/2015 - 12/06/2015) with Neulasta (On-Pro) support.  Cycle #3 was complicated by an acute  left lower extremity DVT.  Cycle #6 was postponed a week secondary to neutropenia ([place] 700).  Zinecard was added with cycle #7.  Olaratumab [name] was added with cycle #8.  Chest, abdomen, and pelvic CT scan on 08/07/2015 revealed a partial treatment response with decrease anterior left upper pleural metastasis, stable to decreased pulmonary metastases and decreased right external iliac adenopathy. There were no new sites of disease.  Chest, abdomen, and pelvic CT scan on 10/22/2015 revealed mild decrease in left anterior chest wall mass/internal mammary lymphadenopathy (3.1 x 4.6 cm to 2.5 x 4.4 cm).  There was decreased mild right external iliac lymphadenopathy (1.4 cm to 1.2 cm).  There was diffuse stable bilateral pulmonary metastases (largest 1.1 cm in anterior right lung).  There was no new or progressive metastatic disease.  Chest CT on 12/25/2015 on revealed slight progression in pulmonary nodules (  index nodules: 5 mm to 11 mm and 8 mm to 11 mm).  The dominant soft tissue lesion along the left hemi-thorax was 2.2 x 4.3 cm (stable).  She received 3 cycles of single agent olaratumab [name] (12/27/2015 -02/08/2016).  She required GCSF secondary to neutropenia.  PET scan on 02/26/2016 revealed progression of numerous pulmonary nodules/metastasis and mild progression (1.2 cm with SUV 7) of right pelvic nodal metastasis.  In the right lung apex, there was a 1.3 x 1.0 cm (SUV 6.2) previously 0.9 x 0.6 cm.  In the anterior left lung was a 2.0 x 2.2 cm (SUV 12.4) previously 1.1 x 0.9 cm.  In the anterior right middle lobe was a 1.1 x 1.1 cm (SUV 2.9), previously 0.9 x 0.9 cm.  The subpleural mass along the anterior of the left upper lobe was 2.1 x 3.5 cm (not FDG avid), previously 2.2 x 3.4 cm.  Bone scan on  03/05/2016 revealed no evidence of metastatic disease.  She received 5 cycles of trabectedin (Yondelis) (03/13/2016 - 07/02/2016) with Neulasta support.  She developed rhabdomyolysis with cycle #5 (CPK 5000).    Chest CT on 04/28/2016 revealed innumerable pulmonary metastases which had slightly increased (91m) in size since 02/26/2016.  PET scan on 08/01/2016 revealed that most of the pulmonary nodules were smaller and demonstrated decrease in metabolic activity. However, the largest lesion in the left upper lobe had increased in metabolic activity (SUV max increased from 12.4 to 17.4) but was fairly similar in size (2.6 cm now compared to 2.3 cm).  There was no mediastinal or hilar hypermetabolic adenopathy.  The right pelvic side wais stable in size (SUV max decreased from 6.9 to 2.9).  There was no new abdominal or pelvic lymphadenopathy.  Chest, abdomen, and pelvic CT scan on 11/25/2016 revealed an interval increase in the size of the dominant mass in the left upper lobe (4.2 x 2.9 cm). There was interval increase in size of innumerable bilateral pulmonary nodules. There was interval increase in the size of the right hilar lymph node (1.5 cm). There was interval increase in the size of hepatic hypodensities (nodules 8-9 mm) highly concerning for hepatic metastasis. There was a stable small pelvic sidewall lesion (1.1 cm).  She received 2 cycles of decarbazine (11/28/2016 - 12/25/2016) with Neulasta support.  Chest, abdomen, and pelvic CT scan on 01/13/2017 revealed progression of metastatic disease with interval increase in number and size of numerous pulmonary nodules and masses bilaterally, slight increase in size of numerous hepatic metastasis, new 1.7 cm right adrenal metastasis, and probable 1.1 cm pancreatic metastasis in the proximal body of the pancreas.  The largest pulmonary nodule measured 6.1 cm x 3.8 cm in the anterior medial left upper lobe which appeared to invade the anterior chest  wall.  Anemia work-up on 07/28/2016 revealed the following normal labs: ferritin (529), iron saturation (26%), TIBC (287), B12, and folate.  Symptomatically, she has left sided chest wall pain.  Exam is stable.  She has hypokalemia.  Plan: 1.  Labs today:  CBC with diff, CMP, Mg. 2.  Review restaging scans.  Discuss progression of disease.  Left sided chest wall pain secondary to invasion of chest wall.  Discuss treatment to date.  Discuss consideration of enrollment on [facility] clinical trial (phase II ALLIANCE study of pazopanib versus [id number]).  If ineligible for clinical trial, would proceed with pazopanib.  Discuss consideration of palliative radiation to chest wall if progressive disease unresponsive to therapy.  Multiple questions asked and  answered. 3.  No chemotherapy today. 4.  Patient to be seen at [facility] with Dr Angelina Ok for protocol enrollment. 5.  Potassium 10 meq po BID x 3 days. 6.  Refill Xarelto. 7.  Patient to call re: follow-up.   Lequita Asal, MD 01/16/2017, 1:09 PM

## 2017-01-19 ENCOUNTER — Other Ambulatory Visit: Payer: Self-pay | Admitting: *Deleted

## 2017-01-19 MED ORDER — POTASSIUM CHLORIDE ER 10 MEQ PO TBCR: 10.0000 meq | EXTENDED_RELEASE_TABLET | Freq: Every day | ORAL | 0 refills | Status: AC

## 2017-02-25 ENCOUNTER — Inpatient Hospital Stay: Payer: 59 | Attending: Hematology and Oncology

## 2017-02-25 DIAGNOSIS — C782 Secondary malignant neoplasm of pleura: Secondary | ICD-10-CM | POA: Diagnosis not present

## 2017-02-25 DIAGNOSIS — Z452 Encounter for adjustment and management of vascular access device: Secondary | ICD-10-CM | POA: Diagnosis not present

## 2017-02-25 DIAGNOSIS — M6282 Rhabdomyolysis: Secondary | ICD-10-CM | POA: Insufficient documentation

## 2017-02-25 DIAGNOSIS — C775 Secondary and unspecified malignant neoplasm of intrapelvic lymph nodes: Secondary | ICD-10-CM | POA: Diagnosis not present

## 2017-02-25 DIAGNOSIS — Z7901 Long term (current) use of anticoagulants: Secondary | ICD-10-CM | POA: Diagnosis not present

## 2017-02-25 DIAGNOSIS — C7802 Secondary malignant neoplasm of left lung: Secondary | ICD-10-CM | POA: Insufficient documentation

## 2017-02-25 DIAGNOSIS — Z86718 Personal history of other venous thrombosis and embolism: Secondary | ICD-10-CM | POA: Diagnosis not present

## 2017-02-25 DIAGNOSIS — C7801 Secondary malignant neoplasm of right lung: Secondary | ICD-10-CM | POA: Diagnosis not present

## 2017-02-25 DIAGNOSIS — Z79899 Other long term (current) drug therapy: Secondary | ICD-10-CM | POA: Diagnosis not present

## 2017-02-25 DIAGNOSIS — C55 Malignant neoplasm of uterus, part unspecified: Secondary | ICD-10-CM | POA: Diagnosis present

## 2017-02-25 DIAGNOSIS — Z95828 Presence of other vascular implants and grafts: Secondary | ICD-10-CM

## 2017-02-25 MED ORDER — HEPARIN SOD (PORK) LOCK FLUSH 100 UNIT/ML IV SOLN
500.0000 [IU] | Freq: Once | INTRAVENOUS | Status: AC
  Administered 2017-02-25: 500 [IU] via INTRAVENOUS

## 2017-02-25 MED ORDER — SODIUM CHLORIDE 0.9% FLUSH
10.0000 mL | INTRAVENOUS | Status: DC | PRN
  Administered 2017-02-25: 10 mL via INTRAVENOUS
  Filled 2017-02-25: qty 10

## 2017-02-26 ENCOUNTER — Other Ambulatory Visit: Payer: Self-pay | Admitting: *Deleted

## 2017-02-26 ENCOUNTER — Telehealth: Payer: Self-pay | Admitting: *Deleted

## 2017-02-26 MED ORDER — OXYCODONE-ACETAMINOPHEN 5-325 MG PO TABS: 1.0000 | ORAL_TABLET | Freq: Four times a day (QID) | ORAL | 0 refills | Status: AC | PRN

## 2017-02-26 NOTE — Telephone Encounter (Signed)
Called patient and informed her that her prescription would be ready for pickup later today, voiced understanding.

## 2017-03-24 ENCOUNTER — Emergency Department: Payer: 59

## 2017-03-24 ENCOUNTER — Encounter: Payer: Self-pay | Admitting: Emergency Medicine

## 2017-03-24 ENCOUNTER — Emergency Department
Admission: EM | Admit: 2017-03-24 | Discharge: 2017-03-25 | Disposition: A | Discharge status: Home / Self Care | Payer: 59 | Attending: Emergency Medicine | Admitting: Emergency Medicine

## 2017-03-24 DIAGNOSIS — I10 Essential (primary) hypertension: Secondary | ICD-10-CM | POA: Diagnosis not present

## 2017-03-24 DIAGNOSIS — Z79899 Other long term (current) drug therapy: Secondary | ICD-10-CM | POA: Diagnosis not present

## 2017-03-24 DIAGNOSIS — C7971 Secondary malignant neoplasm of right adrenal gland: Secondary | ICD-10-CM | POA: Insufficient documentation

## 2017-03-24 DIAGNOSIS — C799 Secondary malignant neoplasm of unspecified site: Secondary | ICD-10-CM

## 2017-03-24 DIAGNOSIS — R112 Nausea with vomiting, unspecified: Secondary | ICD-10-CM | POA: Diagnosis present

## 2017-03-24 DIAGNOSIS — R072 Precordial pain: Secondary | ICD-10-CM | POA: Insufficient documentation

## 2017-03-24 DIAGNOSIS — Z8542 Personal history of malignant neoplasm of other parts of uterus: Secondary | ICD-10-CM | POA: Diagnosis not present

## 2017-03-24 DIAGNOSIS — R197 Diarrhea, unspecified: Secondary | ICD-10-CM | POA: Insufficient documentation

## 2017-03-24 DIAGNOSIS — R1010 Upper abdominal pain, unspecified: Secondary | ICD-10-CM

## 2017-03-24 DIAGNOSIS — R079 Chest pain, unspecified: Secondary | ICD-10-CM

## 2017-03-24 LAB — CBC
HEMATOCRIT: 41.8 % (ref 35.0–47.0)
Hemoglobin: 14.2 g/dL (ref 12.0–16.0)
MCH: 31.8 pg (ref 26.0–34.0)
MCHC: 34 g/dL (ref 32.0–36.0)
MCV: 93.5 fL (ref 80.0–100.0)
Platelets: 205 10*3/uL (ref 150–440)
RBC: 4.47 MIL/uL (ref 3.80–5.20)
RDW: 14.1 % (ref 11.5–14.5)
WBC: 3.8 10*3/uL (ref 3.6–11.0)

## 2017-03-24 LAB — COMPREHENSIVE METABOLIC PANEL
ALT: 49 U/L (ref 14–54)
AST: 62 U/L — AB (ref 15–41)
Albumin: 3.7 g/dL (ref 3.5–5.0)
Alkaline Phosphatase: 309 U/L — ABNORMAL HIGH (ref 38–126)
Anion gap: 8 (ref 5–15)
CHLORIDE: 94 mmol/L — AB (ref 101–111)
CO2: 28 mmol/L (ref 22–32)
Calcium: 10 mg/dL (ref 8.9–10.3)
Creatinine, Ser: 0.68 mg/dL (ref 0.44–1.00)
GFR calc Af Amer: >60 mL/min (ref >60)
Glucose, Bld: 136 mg/dL — ABNORMAL HIGH (ref 65–99)
POTASSIUM: 3.3 mmol/L — AB (ref 3.5–5.1)
Sodium: 130 mmol/L — ABNORMAL LOW (ref 135–145)
Total Bilirubin: 0.7 mg/dL (ref 0.3–1.2)
Total Protein: 8.8 g/dL — ABNORMAL HIGH (ref 6.5–8.1)

## 2017-03-24 LAB — URINALYSIS, COMPLETE (UACMP) WITH MICROSCOPIC
BACTERIA UA: NONE SEEN
BILIRUBIN URINE: NEGATIVE
Glucose, UA: NEGATIVE mg/dL
KETONES UR: 5 mg/dL — AB
LEUKOCYTES UA: NEGATIVE
Nitrite: NEGATIVE
PROTEIN: 30 mg/dL — AB
Specific Gravity, Urine: 1.015 (ref 1.005–1.030)
pH: 7 (ref 5.0–8.0)

## 2017-03-24 LAB — TROPONIN I

## 2017-03-24 LAB — LIPASE, BLOOD

## 2017-03-24 MED ORDER — MORPHINE SULFATE (PF) 4 MG/ML IV SOLN
4.0000 mg | Freq: Once | INTRAVENOUS | Status: AC
  Administered 2017-03-25: 4 mg via INTRAVENOUS
  Filled 2017-03-24: qty 1

## 2017-03-24 MED ORDER — ONDANSETRON HCL 4 MG/2ML IJ SOLN
4.0000 mg | Freq: Once | INTRAMUSCULAR | Status: AC
  Administered 2017-03-25: 4 mg via INTRAVENOUS
  Filled 2017-03-24: qty 2

## 2017-03-24 MED ORDER — SODIUM CHLORIDE 0.9 % IV BOLUS (SEPSIS)
1000.0000 mL | Freq: Once | INTRAVENOUS | Status: AC
  Administered 2017-03-25: 1000 mL via INTRAVENOUS

## 2017-03-24 NOTE — ED Triage Notes (Signed)
Patient presents to the ED with nausea, vomiting, and diarrhea x 2 weeks.  Patient states she is in a clinical trial at [facility] for her Leiomyosarcoma x 4 weeks.  Patient states she has been in touch with staff from trial and has been taking nausea medication but she vomits every time she eats.  Patient reports 2 vomiting episodes in the past 12 hours.  Patient states, "I'm not having diarrhea any more because nothing gets to my stomach."  Patient is also complaining of new severe back pain x 2 weeks and sternal tenderness.

## 2017-03-25 ENCOUNTER — Emergency Department: Payer: 59

## 2017-03-25 MED ORDER — PROMETHAZINE HCL 12.5 MG PO TABS: 12.5000 mg | ORAL_TABLET | Freq: Four times a day (QID) | ORAL | 0 refills | Status: AC | PRN

## 2017-03-25 MED ORDER — OXYCODONE HCL 5 MG PO TABS
10.0000 mg | ORAL_TABLET | Freq: Once | ORAL | Status: AC
  Administered 2017-03-25: 10 mg via ORAL

## 2017-03-25 MED ORDER — OXYCODONE HCL 5 MG PO TABS
ORAL_TABLET | ORAL | Status: AC
  Filled 2017-03-25: qty 2

## 2017-03-25 MED ORDER — IOPAMIDOL (ISOVUE-300) INJECTION 61%
30.0000 mL | Freq: Once | INTRAVENOUS | Status: AC | PRN
  Administered 2017-03-25: 30 mL via ORAL

## 2017-03-25 MED ORDER — IOPAMIDOL (ISOVUE-300) INJECTION 61%
100.0000 mL | Freq: Once | INTRAVENOUS | Status: AC | PRN
  Administered 2017-03-25: 100 mL via INTRAVENOUS

## 2017-03-25 NOTE — ED Notes (Signed)
Patient transported to CT at this time. 

## 2017-03-25 NOTE — ED Provider Notes (Signed)
[facility] Emergency Department Provider Note  ____________________________________________   First MD Initiated Contact with Patient 03/24/17 2208     (approximate)  I have reviewed the triage vital signs and the nursing notes.   HISTORY  Chief Complaint Emesis and Back Pain   HPI Cassidy Bradley is a 56 y.o. female with a history of metastatic leiomyosarcoma who is presenting emergency department with 2 weeks of nausea vomiting and diarrhea. She is also complaining of sharp/aching pain to her lower chest and upper abdomen over the past 2 weeks. Also with the same quality of pain to the left lower thoracic back. The pain is worse with retching. Says that the pain is a 9 out of 10 at this time. The patient is taking Zofran at home as an adjunct to her experimental therapy that she is taking at [facility] for her metastatic disease. Despite using Zofran she is still vomiting.Patient says that she vomited twice today but did not have any diarrhea today.   Past Medical History:  Diagnosis Date  . Breast lump 2006    both breast   . Cancer ([place])    Uterine Leiomyosarcoma  . Chest wall mass   . DVT (deep venous thrombosis) ([facility])    Left leg  . Uterine cancer ([place]) 01/2015   chemo    Patient Active Problem List   Diagnosis Date Noted  . 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 Acute pain of right shoulder 01/12/2017  . Encounter for antineoplastic chemotherapy 01/12/2017  . Hypokalemia 12/07/2016  . Liver metastasis ([place]) 11/28/2016  . Rhabdomyolysis 07/21/2016  . DVT (deep venous thrombosis) ([place])   . Reflux 08/11/2015  . Drug reaction 05/26/2015  . Acute febrile illness 05/25/2015  . Elevated LFTs 05/25/2015  . Anemia 05/25/2015  . Sepsis ([place]) 05/25/2015  . Anemia of chronic disease 05/25/2015  . Candidiasis of mouth 05/25/2015  . Uterine leiomyosarcoma ([place]) 03/02/2015  . Pulmonary metastasis ([place]) 02/12/2015  .  Encounter for screening colonoscopy 08/25/2014  . Allergic rhinitis 08/23/2014  . Hypercholesterolemia 08/23/2014  . Climacteric 08/23/2014  . Headache, migraine 08/23/2014  . Lump or mass in breast 06/23/2013  . Essential hypertension, benign 03/30/2013  . Benign essential HTN 03/30/2013  . Benign breast cyst in female 03/29/2013  . Cyst (solitary) of breast 03/29/2013    Past Surgical History:  Procedure Laterality Date  . ABDOMINAL HYSTERECTOMY  01/30/15  . ANKLE SURGERY  2005  . BACK SURGERY  2005  . BREAST BIOPSY Bilateral 10 + yrs ago   neg  . CESAREAN SECTION  1993  . CHOLECYSTECTOMY  2001  . COLONOSCOPY      Prior to Admission medications   Medication Sig Start Date End Date Taking? Authorizing Provider  azithromycin (ZITHROMAX) 250 MG tablet Take 2 tabs on day one and 1 tablet daily days 2 - 5 Patient not taking: Reported on 12/25/2016 12/09/16   Lloyd Huger, MD  dexamethasone (DECADRON) 4 MG tablet Take 2 tablets by mouth once a day on the day after chemotherapy and then take 2 tablets two times a day for 2 days. Take with food. 11/28/16   Lequita Asal, MD  ferrous sulfate 325 (65 FE) MG tablet Take 1 tablet (325 mg total) by mouth 2 (two) times daily with a meal. 07/23/16   Loletha Grayer, MD  levofloxacin (LEVAQUIN) 500 MG tablet  12/14/16   Historical Provider, MD  lidocaine-prilocaine (EMLA) cream Apply 1 application  topically as needed. Apply one hour prior to chemotherapy Patient taking differently: Apply 1 application topically as needed (prior to accessing pts port). Pt applies one hour prior to chemotherapy. 05/01/15   Lequita Asal, MD  loratadine (CLARITIN) 10 MG tablet Take 10 mg by mouth daily. Reported on 07/02/2016    Historical Provider, MD  Multiple Vitamins-Minerals (ALIVE WOMENS ENERGY) TABS Take 1 tablet by mouth daily.     Historical Provider, MD  ondansetron (ZOFRAN) 8 MG tablet Take 8 mg by mouth every 8 (eight) hours as needed for nausea  or vomiting. Reported on 06/26/2016    Historical Provider, MD  oxyCODONE-acetaminophen (PERCOCET/ROXICET) 5-325 MG tablet Take 1 tablet by mouth every 6 (six) hours as needed for severe pain. 02/26/17   Sindy Guadeloupe, MD  polyethylene glycol powder (GLYCOLAX/MIRALAX) powder Take 1 Container by mouth daily as needed. Reported on 06/26/2016    Historical Provider, MD  potassium chloride (KLOR-CON 10) 10 MEQ tablet Take 1 tablet (10 mEq total) by mouth daily. 01/19/17   Lequita Asal, MD  prochlorperazine (COMPAZINE) 10 MG tablet Take 1 tablet (10 mg total) by mouth every 6 (six) hours as needed (Nausea or vomiting). Patient not taking: Reported on 12/25/2016 11/28/16   Lequita Asal, MD  rivaroxaban (XARELTO) 20 MG TABS tablet Take 1 tablet by mouth  daily with supper 01/16/17   Lequita Asal, MD    Allergies Pravastatin; Sulfa antibiotics; Latex; and Other  Family History  Problem Relation Age of Onset  . Colon polyps Mother   . Lupus Father   . Esophageal cancer Maternal Uncle   . Breast cancer Sister 62    fraternal twin sister  . Breast cancer Maternal Aunt   . Breast cancer Maternal Grandmother 60  . Stomach cancer Maternal Aunt   . Lung cancer Maternal Grandfather   . Cancer - Colon Cousin 79    first cousin    Social History Social History  Substance Use Topics  . Smoking status: Never Smoker  . Smokeless tobacco: Never Used  . Alcohol use No    Review of Systems Constitutional: No fever/chills Eyes: No visual changes. ENT: No sore throat. Cardiovascular: as above Respiratory: Denies shortness of breath. Gastrointestinal:  No constipation. Genitourinary: Negative for dysuria. Musculoskeletal: as above Skin: Negative for rash. Neurological: Negative for headaches, focal weakness or numbness.  10-point ROS otherwise negative.  ____________________________________________   PHYSICAL EXAM:  VITAL SIGNS: ED Triage Vitals  Enc Vitals Group     BP  03/24/17 1812 (!) 125/95     Pulse Rate 03/24/17 1812 (!) 119     Resp 03/24/17 1812 18     Temp 03/24/17 1812 98.5 F (36.9 C)     Temp Source 03/24/17 1812 Oral     SpO2 03/24/17 1812 99 %     Weight 03/24/17 1813 135 lb (61.2 kg)     Height 03/24/17 1813 5\' 3"  (1.6 m)     Head Circumference --      Peak Flow --      Pain Score 03/24/17 1812 8     Pain Loc --      Pain Edu? --      Excl. in [place]? --     Constitutional: Alert and oriented. Well appearing and in no acute distress. Eyes: Conjunctivae are normal. PERRL. EOMI. Head: Atraumatic. Nose: No congestion/rhinnorhea. Mouth/Throat: Mucous membranes are moist.   Neck: No stridor.   Cardiovascular: Tachycardic, regular rhythm. Grossly normal  heart sounds.  Visible tenderness to the distal sternum. Respiratory: Normal respiratory effort.  No retractions. Lungs CTAB. Gastrointestinal: Soft and mild epigastric tenderness to palpation. No distention. No CVA tenderness. Musculoskeletal: No lower extremity tenderness nor edema.  No joint effusions.  Mild left CVA tenderness palpation.  Neurologic:  Normal speech and language. No gross focal neurologic deficits are appreciated.  Skin:  Skin is warm, dry and intact. No rash noted. Psychiatric: Mood and affect are normal. Speech and behavior are normal.  ____________________________________________   LABS (all labs ordered are listed, but only abnormal results are displayed)  Labs Reviewed  LIPASE, BLOOD - Abnormal; Notable for the following:       Result Value   Lipase <10 (*)    All other components within normal limits  COMPREHENSIVE METABOLIC PANEL - Abnormal; Notable for the following:    Sodium 130 (*)    Potassium 3.3 (*)    Chloride 94 (*)    Glucose, Bld 136 (*)    BUN <5 (*)    Total Protein 8.8 (*)    AST 62 (*)    Alkaline Phosphatase 309 (*)    All other components within normal limits  URINALYSIS, COMPLETE (UACMP) WITH MICROSCOPIC - Abnormal; Notable for  the following:    Color, Urine YELLOW (*)    APPearance HAZY (*)    Hgb urine dipstick MODERATE (*)    Ketones, ur 5 (*)    Protein, ur 30 (*)    Squamous Epithelial / LPF 0-5 (*)    All other components within normal limits  CBC  TROPONIN I   ____________________________________________  EKG  ED ECG REPORT I, Doran Stabler, the attending physician, personally viewed and interpreted this ECG.   Date: 03/25/2017  EKG Time: 1823  Rate: 110  Rhythm: sinus tachycardia  Axis: Normal  Intervals:normal  ST&T Change: No ST segment elevation or depression. No abnormal T-wave inversion.  ____________________________________________  GYIRSWNIO  DG Chest 2 View (Final result)  Result time 03/24/17 23:15:18  Final result by Santa Lighter, MD (03/24/17 23:15:18)           Narrative:   CLINICAL DATA: Subacute onset of nausea, vomiting and diarrhea. Severe lower back pain and sternal tenderness. Initial encounter.  EXAM: CHEST 2 VIEW  COMPARISON: Chest radiograph performed 06/26/2016, and CT of the chest performed 01/13/2017  FINDINGS: The lungs are well-aerated. Diffuse bilateral metastatic disease is again noted, with scattered masses of varying size, the largest of which is seen at the anterior left lung apex. Peribronchial thickening is noted. No pleural effusion or pneumothorax is seen.  The heart is normal in size; the mediastinal contour is within normal limits. No acute osseous abnormalities are seen. Clips are noted within the right upper quadrant, reflecting prior cholecystectomy. The right-sided chest port is noted ending about the right atrium.  IMPRESSION: Diffuse bilateral metastatic disease again noted, similar in appearance to the prior CT. No definite superimposed focal airspace consolidation seen.   Electronically Signed By: Garald Balding M.D. On: 03/24/2017 23:15             ____________________________________________   PROCEDURES  Procedure(s) performed:   Procedures  Critical Care performed:   ____________________________________________   INITIAL IMPRESSION / ASSESSMENT AND PLAN / ED COURSE  Pertinent labs & imaging results that were available during my care of the patient were reviewed by me and considered in my medical decision making (see chart for details).  ----------------------------------------- 2703 PM on 03/24/2017 -----------------------------------------  Patient receiving IV fluids as well as antiemetics and pain medication. Feel the most likely diagnosis is a side effect from the patient's treatment. Signed out to Dr. Owens Shark for reevaluation. Patient may require CAT scans including CT angiography of the chest and CT abdomen and pelvis for further evaluation if the symptoms do not resolve.       ____________________________________________   FINAL CLINICAL IMPRESSION(S) / ED DIAGNOSES  Chest pain. Abdominal pain. Nausea vomiting and diarrhea.    NEW MEDICATIONS STARTED DURING THIS VISIT:  New Prescriptions   No medications on file     Note:  This document was prepared using Dragon voice recognition software and may include unintentional dictation errors.    Orbie Pyo, MD 03/25/17 [id number]

## 2017-05-26 ENCOUNTER — Emergency Department
Admission: EM | Admit: 2017-05-26 | Discharge: 2017-05-26 | Disposition: A | Discharge status: Home / Self Care | Payer: 59 | Attending: Emergency Medicine | Admitting: Emergency Medicine

## 2017-05-26 ENCOUNTER — Emergency Department: Payer: 59

## 2017-05-26 ENCOUNTER — Encounter: Payer: Self-pay | Admitting: Emergency Medicine

## 2017-05-26 DIAGNOSIS — R0602 Shortness of breath: Secondary | ICD-10-CM | POA: Insufficient documentation

## 2017-05-26 DIAGNOSIS — R0600 Dyspnea, unspecified: Secondary | ICD-10-CM

## 2017-05-26 DIAGNOSIS — C7802 Secondary malignant neoplasm of left lung: Secondary | ICD-10-CM | POA: Insufficient documentation

## 2017-05-26 DIAGNOSIS — Z79899 Other long term (current) drug therapy: Secondary | ICD-10-CM | POA: Diagnosis not present

## 2017-05-26 DIAGNOSIS — R079 Chest pain, unspecified: Secondary | ICD-10-CM

## 2017-05-26 DIAGNOSIS — R0789 Other chest pain: Secondary | ICD-10-CM | POA: Insufficient documentation

## 2017-05-26 DIAGNOSIS — I10 Essential (primary) hypertension: Secondary | ICD-10-CM | POA: Insufficient documentation

## 2017-05-26 DIAGNOSIS — C799 Secondary malignant neoplasm of unspecified site: Secondary | ICD-10-CM

## 2017-05-26 DIAGNOSIS — C7801 Secondary malignant neoplasm of right lung: Secondary | ICD-10-CM | POA: Insufficient documentation

## 2017-05-26 LAB — TROPONIN I: TROPONIN I: 0.03 ng/mL — AB (ref <0.03)

## 2017-05-26 LAB — HEPATIC FUNCTION PANEL
ALT: 32 U/L (ref 14–54)
AST: 54 U/L — ABNORMAL HIGH (ref 15–41)
Albumin: 2.8 g/dL — ABNORMAL LOW (ref 3.5–5.0)
Alkaline Phosphatase: 291 U/L — ABNORMAL HIGH (ref 38–126)
BILIRUBIN DIRECT: 0.2 mg/dL (ref 0.1–0.5)
BILIRUBIN INDIRECT: 0.7 mg/dL (ref 0.3–0.9)
Total Bilirubin: 0.9 mg/dL (ref 0.3–1.2)
Total Protein: 7.8 g/dL (ref 6.5–8.1)

## 2017-05-26 LAB — CBC
HCT: 30.9 % — ABNORMAL LOW (ref 35.0–47.0)
Hemoglobin: 10.5 g/dL — ABNORMAL LOW (ref 12.0–16.0)
MCH: 29.5 pg (ref 26.0–34.0)
MCHC: 33.9 g/dL (ref 32.0–36.0)
MCV: 86.8 fL (ref 80.0–100.0)
PLATELETS: 317 10*3/uL (ref 150–440)
RBC: 3.55 MIL/uL — ABNORMAL LOW (ref 3.80–5.20)
RDW: 17.1 % — AB (ref 11.5–14.5)
WBC: 10.2 10*3/uL (ref 3.6–11.0)

## 2017-05-26 LAB — BASIC METABOLIC PANEL
Anion gap: 10 (ref 5–15)
BUN: 8 mg/dL (ref 6–20)
CALCIUM: 9.5 mg/dL (ref 8.9–10.3)
CO2: 28 mmol/L (ref 22–32)
CREATININE: 0.48 mg/dL (ref 0.44–1.00)
Chloride: 92 mmol/L — ABNORMAL LOW (ref 101–111)
GFR calc non Af Amer: >60 mL/min (ref >60)
GLUCOSE: 122 mg/dL — AB (ref 65–99)
Potassium: 3.4 mmol/L — ABNORMAL LOW (ref 3.5–5.1)
Sodium: 130 mmol/L — ABNORMAL LOW (ref 135–145)

## 2017-05-26 LAB — LIPASE, BLOOD: LIPASE: 14 U/L (ref 11–51)

## 2017-05-26 MED ORDER — HYDROMORPHONE HCL 1 MG/ML IJ SOLN
1.0000 mg | Freq: Once | INTRAMUSCULAR | Status: AC
  Administered 2017-05-26: 1 mg via INTRAVENOUS
  Filled 2017-05-26: qty 1

## 2017-05-26 MED ORDER — IOPAMIDOL (ISOVUE-370) INJECTION 76%
75.0000 mL | Freq: Once | INTRAVENOUS | Status: AC | PRN
  Administered 2017-05-26: 75 mL via INTRAVENOUS

## 2017-05-26 MED ORDER — SODIUM CHLORIDE 0.9 % IV BOLUS (SEPSIS)
1000.0000 mL | Freq: Once | INTRAVENOUS | Status: AC
  Administered 2017-05-26: 1000 mL via INTRAVENOUS

## 2017-05-26 MED ORDER — MAGNESIUM CITRATE PO SOLN
1.0000 | Freq: Once | ORAL | Status: AC
  Administered 2017-05-26: 1 via ORAL
  Filled 2017-05-26: qty 296

## 2017-05-26 MED ORDER — HYDROMORPHONE HCL 2 MG PO TABS: 2.0000 mg | ORAL_TABLET | Freq: Two times a day (BID) | ORAL | 0 refills | Status: AC | PRN

## 2017-05-26 NOTE — ED Notes (Signed)
Date and time results received: 05/26/17 1235   Test: troponin Critical Value: 0.03  Name of Provider Notified: [place]

## 2017-05-26 NOTE — ED Triage Notes (Addendum)
C/O left lower back pain for "months".  Also c/o left sided chest pain, worse with inspiration.  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  Patient has been taking oxycodone and oxycontin for pain.  2 tabs oxycodone taken this morning at 0630 and 1 oxycontin taken today at 1030.

## 2017-05-26 NOTE — ED Notes (Signed)
Pt was given ginger ale earlier today.   Pt drinking magnesium at this time.

## 2017-05-26 NOTE — ED Notes (Signed)
Resumed care from [place] rn.  Pt alert.  Family with pt.  Sinus tach on monitor.  Iv infusing.

## 2017-05-26 NOTE — ED Notes (Signed)
Pt states back and chest pain. Pt belly sore. Pt states spitting up phlegm. No BM since Thursday. Pt has been taking oxycotin for pain without relief. Pt takes senokot and colace to help with constipation.   Alert and oriented. Family at bedside.

## 2017-05-26 NOTE — Discharge Instructions (Signed)
Take dilaudid as needed for severe pain.  This works the same way as your oxycodone and oxycontin, so you should avoid combining dilaudid with the oxycodone to prevent overdose effects.  Continue taking your oxycontin as prescribed for daily pain control.  Take dilaudid as needed for severe breakthrough pain.

## 2017-05-26 NOTE — ED Triage Notes (Signed)
Pt reports lower back pain and shortness of breath that started today.

## 2017-05-26 NOTE — ED Provider Notes (Signed)
[facility] Emergency Department Provider Note  ____________________________________________  Time seen: Approximately 1:20 PM  I have reviewed the triage vital signs and the nursing notes.   HISTORY  Chief Complaint Back Pain and Chest Pain    HPI LADONA ROSTEN is a 56 y.o. 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 Denies any new trips slips falls or unusual activity.Also complains of epigastric and left-sided chest pain that is worse with inspiration and associated with shortness of breath. Also reports a productive cough. No fever or chills. No dizziness or syncope. Not exertional. No vomiting or diaphoresis  Multiple areas of pain are not particularly connected or radiating together. Pain is moderate intensity and achy. No alleviating factors.   Past Medical History:  Diagnosis Date  . Breast lump 2006    both breast   . 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 Chest wall mass   . DVT (deep venous thrombosis) (HCC)    Left leg  . Uterine cancer ([place]) 01/2015   chemo     Patient Active Problem List   Diagnosis Date Noted  . 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 Acute pain of right shoulder 01/12/2017  . Encounter for antineoplastic chemotherapy 01/12/2017  . Hypokalemia 12/07/2016  . Liver metastasis ([place]) 11/28/2016  . Rhabdomyolysis 07/21/2016  . DVT (deep venous thrombosis) ([place])   . Reflux 08/11/2015  . Drug reaction 05/26/2015  . Acute febrile illness 05/25/2015  . Elevated LFTs 05/25/2015  . Anemia 05/25/2015  . Sepsis ([place]) 05/25/2015  . Anemia of chronic disease 05/25/2015  . Candidiasis of mouth 05/25/2015  . 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 Pulmonary metastasis ([place]) 02/12/2015  . Encounter for screening  colonoscopy 08/25/2014  . Allergic rhinitis 08/23/2014  . Hypercholesterolemia 08/23/2014  . Climacteric 08/23/2014  . Headache, migraine 08/23/2014  . Lump or mass in breast 06/23/2013  . Essential hypertension, benign 03/30/2013  . Benign essential HTN 03/30/2013  . Benign breast cyst in female 03/29/2013  . Cyst (solitary) of breast 03/29/2013     Past Surgical History:  Procedure Laterality Date  . ABDOMINAL HYSTERECTOMY  01/30/15  . ANKLE SURGERY  2005  . BACK SURGERY  2005  . BREAST BIOPSY Bilateral 10 + yrs ago   neg  . CESAREAN SECTION  1993  . CHOLECYSTECTOMY  2001  . COLONOSCOPY       Prior to Admission medications   Medication Sig Start Date End Date Taking? Authorizing Provider  docusate sodium (COLACE) 100 MG capsule Take 200 mg by mouth at bedtime.   Yes [provider]  gabapentin (NEURONTIN) 300 MG capsule Take 300 mg by mouth 3 (three) times daily. 05/06/17  Yes [provider]  lidocaine-prilocaine (EMLA) cream Apply 1 application topically as needed. Apply one hour prior to chemotherapy 05/01/15  Yes Corcoran, Drue Second, MD  magnesium hydroxide (MILK OF MAGNESIA) 400 MG/5ML suspension Take 15 mLs by mouth daily as needed for mild constipation.   Yes [provider]  Multiple Vitamins-Minerals (ALIVE WOMENS ENERGY) TABS Take 1 tablet by mouth daily.    Yes [provider]  oxyCODONE (OXY IR/ROXICODONE) 5 MG immediate release tablet Take 5-10 mg by mouth every 4 (four) hours as needed. 05/14/17  Yes [provider]  Alesia Morin  10 MG 12 hr tablet Take 10 mg by mouth 3 (three) times daily. 04/30/17  Yes [provider]  potassium chloride (KLOR-CON 10) 10 MEQ tablet Take 1 tablet (10 mEq total) by mouth daily. Patient taking differently: Take 20 mEq by mouth daily.  01/19/17  Yes Lequita Asal, MD  rivaroxaban (XARELTO) 20 MG TABS tablet Take 1 tablet by mouth  daily with supper 01/16/17  Yes Corcoran, Melissa C, MD   senna-docusate (SENOKOT-S) 8.6-50 MG tablet Take 3 tablets by mouth at bedtime.   Yes [provider]  dexamethasone (DECADRON) 4 MG tablet Take 2 tablets by mouth once a day on the day after chemotherapy and then take 2 tablets two times a day for 2 days. Take with food. Patient not taking: Reported on 05/26/2017 11/28/16   Lequita Asal, MD  ferrous sulfate 325 (65 FE) MG tablet Take 1 tablet (325 mg total) by mouth 2 (two) times daily with a meal. Patient not taking: Reported on 05/26/2017 07/23/16   Loletha Grayer, MD  HYDROmorphone (DILAUDID) 2 MG tablet Take 1 tablet (2 mg total) by mouth every 12 (twelve) hours as needed for severe pain. 05/26/17 05/26/18  Carrie Mew, MD  oxyCODONE-acetaminophen (PERCOCET/ROXICET) 5-325 MG tablet Take 1 tablet by mouth every 6 (six) hours as needed for severe pain. Patient not taking: Reported on 05/26/2017 02/26/17   Sindy Guadeloupe, MD  prochlorperazine (COMPAZINE) 10 MG tablet Take 1 tablet (10 mg total) by mouth every 6 (six) hours as needed (Nausea or vomiting). Patient not taking: Reported on 12/25/2016 11/28/16   Lequita Asal, MD  promethazine (PHENERGAN) 12.5 MG tablet Take 1 tablet (12.5 mg total) by mouth every 6 (six) hours as needed for nausea or vomiting. Patient not taking: Reported on 05/26/2017 03/25/17   Gregor Hams, MD     Allergies Pravastatin; Sulfa antibiotics; Latex; and Other   Family History  Problem Relation Age of Onset  . Colon polyps Mother   . Lupus Father   . Esophageal cancer Maternal Uncle   . Breast cancer Sister 22       fraternal twin sister  . Breast cancer Maternal Aunt   . Breast cancer Maternal Grandmother 60  . Stomach cancer Maternal Aunt   . Lung cancer Maternal Grandfather   . Cancer - Colon Cousin 64       first cousin    Social History Social History  Substance Use Topics  . Smoking status: Never Smoker  . Smokeless tobacco: Never Used  . Alcohol use No    Review  of Systems  Constitutional:   No fever or chills.  ENT:   No sore throat. No rhinorrhea. Cardiovascular:   Positive as above for chest pain without syncope. Respiratory:   Positive for shortness of breath and cough. Gastrointestinal:   Positive as above for epigastric pain without vomiting and diarrhea.  Musculoskeletal:   Negative for focal pain or swelling All other systems reviewed and are negative except as documented above in ROS and HPI.  ____________________________________________   PHYSICAL EXAM:  VITAL SIGNS: ED Triage Vitals  Enc Vitals Group     BP 05/26/17 1135 (!) 142/88     Pulse Rate 05/26/17 1135 (!) 114     Resp 05/26/17 1135 (!) 22     Temp 05/26/17 1135 98.6 F (37 C)     Temp Source 05/26/17 1135 Oral     SpO2 05/26/17 1135 100 %     Weight  05/26/17 1134 113 lb (51.3 kg)     Height 05/26/17 1134 5\' 4"  (1.626 m)     Head Circumference --      Peak Flow --      Pain Score 05/26/17 1133 9     Pain Loc --      Pain Edu? --      Excl. in [place]? --     Vital signs reviewed, nursing assessments reviewed.   Constitutional:   Alert and oriented.Not in distress. Eyes:   No scleral icterus. No conjunctival pallor. PERRL. EOMI.  No nystagmus. ENT   Head:   Normocephalic and atraumatic.   Nose:   No congestion/rhinnorhea.    Mouth/Throat:   MMM, no pharyngeal erythema. No peritonsillar mass.    Neck:   No meningismus. Full ROM Hematological/Lymphatic/Immunilogical:   No cervical lymphadenopathy. Cardiovascular:   Tachycardia heart rate 120. Symmetric bilateral radial and DP pulses.  No murmurs.  Respiratory:   Normal respiratory effort without tachypnea/retractions. Breath sounds are clear and equal bilaterally. Mild end expiratory wheezing. Gastrointestinal:   Soft with mild epigastric tenderness. Non distended. There is no CVA tenderness.  No rebound, rigidity, or guarding. Genitourinary:   deferred Musculoskeletal:   Normal range of motion in all  extremities. No joint effusions.  No lower extremity tenderness.  No edema. Neurologic:   Normal speech and language.   Motor grossly intact. No gross focal neurologic deficits are appreciated.  Skin:    Skin is warm, dry and intact. No rash noted.  No petechiae, purpura, or bullae.  ____________________________________________    LABS (pertinent positives/negatives) (all labs ordered are listed, but only abnormal results are displayed) Labs Reviewed  BASIC METABOLIC PANEL - Abnormal; Notable for the following:       Result Value   Sodium 130 (*)    Potassium 3.4 (*)    Chloride 92 (*)    Glucose, Bld 122 (*)    All other components within normal limits  CBC - Abnormal; Notable for the following:    RBC 3.55 (*)    Hemoglobin 10.5 (*)    HCT 30.9 (*)    RDW 17.1 (*)    All other components within normal limits  TROPONIN I - Abnormal; Notable for the following:    Troponin I 0.03 (*)    All other components within normal limits  HEPATIC FUNCTION PANEL - Abnormal; Notable for the following:    Albumin 2.8 (*)    AST 54 (*)    Alkaline Phosphatase 291 (*)    All other components within normal limits  LIPASE, BLOOD   ____________________________________________   EKG  Interpreted by me Sinus tachycardia rate 119, normal axis and intervals. Normal QRS ST segments and T waves  ____________________________________________    RADIOLOGY  Dg Chest 2 View  Result Date: 05/26/2017 CLINICAL DATA:  LEFT side chest wall pain and shortness of breath, history of uterine cancer, pancreatic mass, hypertension, pulmonary metastatic disease EXAM: CHEST  2 VIEW COMPARISON:  03/24/2017 FINDINGS: LEFT subclavian Port-A-Cath with tip projecting over RIGHT atrium. Normal heart size and pulmonary vascularity. Aortic arch obscured by a large medial LEFT upper lobe mass. Numerous additional pulmonary nodules are seen in both lungs consistent with metastases, appear increased in sizes and  number. No acute infiltrate, pleural effusion or pneumothorax. No definite osseous metastases identified. IMPRESSION: Increased pulmonary metastases. Minimally increased size of dominant LEFT upper lobe mass adjacent to the aortic arch. Electronically Signed   By: Lavonia Dana  M.D.   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 Multiplanar CT image reconstructions and MIPs were obtained to evaluate the vascular anatomy. Multidetector CT imaging of the abdomen and pelvis was performed using the standard protocol during bolus administration of intravenous contrast. CONTRAST:  75 cc Isovue 370 IV COMPARISON:  03/25/2017 CT abdomen and pelvis, CT chest 01/13/2017 FINDINGS: CTA CHEST FINDINGS Cardiovascular: Aorta normal caliber without aneurysm or dissection. Minimal atherosclerotic calcification aortic arch. No pericardial effusion. Pulmonary arteries well opacified and patent. No evidence of pulmonary embolism. Filling defect identified within the LEFT upper lobe pulmonary vein likely representing tumor thrombus extending inferiorly from the patient's large anterior LEFT upper lobe metastasis. Mediastinum/Nodes: Minimally enlarged RIGHT hilar lymph node. No additional thoracic adenopathy. Esophagus and base of cervical region unremarkable. Lungs/Pleura: Widespread pulmonary metastases increased in size and number since 01/13/2017. Largest tumor mass in the LEFT upper lobe likely invades the anterior chest wall and mediastinum, measures 9.2 x 5.2 x 5.5 cm. No acute infiltrate, pleural effusion or pneumothorax. Musculoskeletal: Diffuse  osseous demineralization. No osseous metastases. Review of the MIP images confirms the above findings. CT ABDOMEN and PELVIS FINDINGS Hepatobiliary: Hepatic metastases increased in sizes since previous exam. Index lesion anterior aspect lateral segment LEFT lobe 2.5 x 2.3 cm, previously 1.9 x 1.8 cm. Gallbladder surgically absent. Pancreas: Significant interval increase in size of tumor mass at the pancreatic body now 6.3 x 4.7 cm image 26 previously 2.7 x 3.0 cm. Pancreatic tail atrophy with ductal dilatation. Infiltrative changes of peripancreatic fat planes. Spleen: Normal appearance Adrenals/Urinary Tract: Small probable renal cysts. LEFT adrenal gland unremarkable. RIGHT adrenal metastasis now 4.7 x 4.0 cm previously 3.1 x 2.5 cm. Question small metastatic lesion at inferior pole of the LEFT kidney 10 mm diameter previously 6 mm. Question small metastatic lesion at upper pole of RIGHT kidney laterally image 26. No additional renal masses, hydronephrosis, or urinary tract calcification. Bladder unremarkable. Stomach/Bowel: Appendix not visualized. Stomach and bowel loops grossly unremarkable. Vascular/Lymphatic: Atherosclerotic calcification aorta without aneurysm. Large filling defect within portal vein compatible with thrombus lipoma likely tumor thrombus arising from the adjacent pancreatic mass. Reproductive: Uterus surgically absent with nonvisualization of ovaries Other: No free air or free fluid.  No hernia. Musculoskeletal: No osseous metastases. RIGHT gluteal metastasis 2.6 x 2.6 cm image 64, increased. Subcutaneous nodule LEFT flank 8 mm image 17 series 9, increased. Question tiny subcutaneous nodule posterolateral RIGHT abdominal wall image 40. Review of the MIP images confirms the above findings. IMPRESSION: No evidence of pulmonary embolism. Progressive pulmonary and hepatic metastatic disease with question new small renal metastases. Increased size of RIGHT adrenal and RIGHT gluteal metastases as  well as 2 probable small subcutaneous metastases. Tumor thrombus within LEFT upper lobe pulmonary vein and within portal vein. Findings called to Dr. Joni Fears on 05/26/2017 at 1418 hours. Electronically Signed   By: Lavonia Dana M.D.   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 Multiplanar CT image reconstructions and MIPs were obtained to evaluate the vascular anatomy. Multidetector CT imaging of the abdomen and pelvis was performed using the standard protocol during bolus administration of intravenous contrast. CONTRAST:  75 cc Isovue 370 IV COMPARISON:  03/25/2017 CT abdomen and pelvis, CT chest 01/13/2017 FINDINGS: CTA CHEST FINDINGS Cardiovascular: Aorta normal caliber without aneurysm or dissection. Minimal atherosclerotic calcification aortic arch. No pericardial effusion. Pulmonary arteries well opacified and patent. No evidence of pulmonary embolism. Filling defect identified within the LEFT upper lobe pulmonary vein likely representing tumor thrombus extending inferiorly from the patient's large anterior LEFT upper lobe metastasis. Mediastinum/Nodes: Minimally enlarged RIGHT hilar lymph node. No additional thoracic adenopathy. Esophagus and base of cervical region unremarkable. Lungs/Pleura: Widespread pulmonary metastases increased in size and number since 01/13/2017. Largest tumor mass in the LEFT upper lobe likely invades the anterior chest wall and mediastinum, measures 9.2 x 5.2 x 5.5 cm. No acute infiltrate, pleural effusion or pneumothorax. Musculoskeletal: Diffuse osseous demineralization. No osseous metastases. Review  of the MIP images confirms the above findings. CT ABDOMEN and PELVIS FINDINGS Hepatobiliary: Hepatic metastases increased in sizes since previous exam. Index lesion anterior aspect lateral segment LEFT lobe 2.5 x 2.3 cm, previously 1.9 x 1.8 cm. Gallbladder surgically absent. Pancreas: Significant interval increase in size of tumor mass at the pancreatic body now 6.3 x 4.7 cm image 26 previously 2.7 x 3.0 cm. Pancreatic tail atrophy with ductal dilatation. Infiltrative changes of peripancreatic fat planes. Spleen: Normal appearance Adrenals/Urinary Tract: Small probable renal cysts. LEFT adrenal gland unremarkable. RIGHT adrenal metastasis now 4.7 x 4.0 cm previously 3.1 x 2.5 cm. Question small metastatic lesion at inferior pole of the LEFT kidney 10 mm diameter previously 6 mm. Question small metastatic lesion at upper pole of RIGHT kidney laterally image 26. No additional renal masses, hydronephrosis, or urinary tract calcification. Bladder unremarkable. Stomach/Bowel: Appendix not visualized. Stomach and bowel loops grossly unremarkable. Vascular/Lymphatic: Atherosclerotic calcification aorta without aneurysm. Large filling defect within portal vein compatible with thrombus lipoma likely tumor thrombus arising from the adjacent pancreatic mass. Reproductive: Uterus surgically absent with nonvisualization of ovaries Other: No free air or free fluid.  No hernia. Musculoskeletal: No osseous metastases. RIGHT gluteal metastasis 2.6 x 2.6 cm image 64, increased. Subcutaneous nodule LEFT flank 8 mm image 17 series 9, increased. Question tiny subcutaneous nodule posterolateral RIGHT abdominal wall image 40. Review of the MIP images confirms the above findings. IMPRESSION: No evidence of pulmonary embolism. Progressive pulmonary and hepatic metastatic disease with question new small renal metastases. Increased size of RIGHT adrenal and RIGHT gluteal metastases as well as 2 probable small subcutaneous metastases. Tumor  thrombus within LEFT upper lobe pulmonary vein and within portal vein. Findings called to Dr. Joni Fears on 05/26/2017 at 1418 hours. Electronically Signed   By: Lavonia Dana M.D.   On: 05/26/2017 14:21    ____________________________________________   PROCEDURES Procedures  ____________________________________________   INITIAL IMPRESSION / ASSESSMENT AND PLAN / ED COURSE  Pertinent labs & imaging results that were available during my care of the patient were reviewed by me and considered in my medical decision making (see chart for details).   Clinical Course as of May 27 1551  Tue May 26, 2017  1153 Patient presents with pleuritic chest pain and shortness of breath and tachycardia in the setting of metastatic sarcoma. We'll need a CT angiogram to evaluate for  PE. Also has epigastric pain and left lower quadrant pain. Low suspicion for aortic dissection or AAA or mesenteric ischemia, but we will also get a CT scan of the abdomen and pelvis to further evaluate the sources of pain. Patient does appear to be dehydrated and will be given Dilaudid and IV fluid bolus meantime.  [PS]  1444 Pain well controlled by dilaudid initially. Now recurring. Will give repeat dose.  Contacted [facility] to page Dr. Angelina Ok for further follow up recommendations given today's CT findings.   [PS]    Clinical Course User Index [PS] Carrie Mew, MD    ----------------------------------------- 3:51 PM on 05/26/2017 -----------------------------------------  Case discussed with Dr. Carlis Abbott of [place]. He agrees that symptoms appear to be due to progressive metastatic disease. Agrees with follow-up in 2 days with Dr. Angelina Ok the patient is otherwise stable. Tachycardia is improved. No PE or dissection. Her cancer does appear to be severe and end-stage. Patient comfortable with the pain control provided by Dilaudid and outpatient follow-up. We'll discharge with return precautions. Counseled her on  constipation with the opiate pain medications. She has that with this before. We'll give magnesium citrate for now and recommend she continue stool softener and MiraLAX.   ____________________________________________   FINAL CLINICAL IMPRESSION(S) / ED DIAGNOSES  Final diagnoses:  Dyspnea  Chest pain  Metastatic cancer (HCC)      New Prescriptions   HYDROMORPHONE (DILAUDID) 2 MG TABLET    Take 1 tablet (2 mg total) by mouth every 12 (twelve) hours as needed for severe pain.     Portions of this note were generated with dragon dictation software. Dictation errors may occur despite best attempts at proofreading.    Carrie Mew, MD 05/26/17 [phone]

## 2017-05-26 NOTE — ED Notes (Signed)
Pt in scans. °

## 2017-05-29 ENCOUNTER — Encounter: Payer: Self-pay | Admitting: Hematology and Oncology

## 2017-05-29 ENCOUNTER — Inpatient Hospital Stay: Payer: 59 | Attending: Hematology and Oncology | Admitting: Hematology and Oncology

## 2017-05-29 VITALS — BP 107/77 | HR 103 | Temp 97.5°F | Resp 18 | Wt 115.2 lb

## 2017-05-29 DIAGNOSIS — R112 Nausea with vomiting, unspecified: Secondary | ICD-10-CM | POA: Diagnosis not present

## 2017-05-29 DIAGNOSIS — C55 Malignant neoplasm of uterus, part unspecified: Secondary | ICD-10-CM | POA: Diagnosis not present

## 2017-05-29 DIAGNOSIS — R634 Abnormal weight loss: Secondary | ICD-10-CM

## 2017-05-29 DIAGNOSIS — K5900 Constipation, unspecified: Secondary | ICD-10-CM | POA: Diagnosis not present

## 2017-05-29 DIAGNOSIS — C782 Secondary malignant neoplasm of pleura: Secondary | ICD-10-CM | POA: Diagnosis not present

## 2017-05-29 DIAGNOSIS — C787 Secondary malignant neoplasm of liver and intrahepatic bile duct: Secondary | ICD-10-CM

## 2017-05-29 DIAGNOSIS — Z7901 Long term (current) use of anticoagulants: Secondary | ICD-10-CM | POA: Diagnosis not present

## 2017-05-29 DIAGNOSIS — C7801 Secondary malignant neoplasm of right lung: Secondary | ICD-10-CM | POA: Diagnosis not present

## 2017-05-29 DIAGNOSIS — C775 Secondary and unspecified malignant neoplasm of intrapelvic lymph nodes: Secondary | ICD-10-CM | POA: Insufficient documentation

## 2017-05-29 DIAGNOSIS — G893 Neoplasm related pain (acute) (chronic): Secondary | ICD-10-CM | POA: Diagnosis not present

## 2017-05-29 DIAGNOSIS — Z79899 Other long term (current) drug therapy: Secondary | ICD-10-CM | POA: Diagnosis not present

## 2017-05-29 DIAGNOSIS — R630 Anorexia: Secondary | ICD-10-CM

## 2017-05-29 DIAGNOSIS — Z7189 Other specified counseling: Secondary | ICD-10-CM

## 2017-05-29 DIAGNOSIS — M6282 Rhabdomyolysis: Secondary | ICD-10-CM | POA: Diagnosis not present

## 2017-05-29 DIAGNOSIS — Z86718 Personal history of other venous thrombosis and embolism: Secondary | ICD-10-CM | POA: Diagnosis not present

## 2017-05-29 DIAGNOSIS — C7802 Secondary malignant neoplasm of left lung: Secondary | ICD-10-CM | POA: Insufficient documentation

## 2017-05-29 DIAGNOSIS — C7800 Secondary malignant neoplasm of unspecified lung: Secondary | ICD-10-CM

## 2017-05-29 MED ORDER — MEGESTROL ACETATE 400 MG/10ML PO SUSP: 200.0000 mg | Freq: Every day | ORAL | 0 refills | Status: AC

## 2017-05-29 NOTE — Progress Notes (Signed)
Patient states she went to [facility] yesterday.  Today is in a lot of pain in her back 8/10.  Patient states she was given hydromorphone in the ED.  Asking for another prescription, since she is almost out and this seems to work well for her.  Pain went to ED on Tuesday for pain.  She got IVF's and pain medication.  Patient states she is having a lot of pain everyday.  Also states she is having pain where the mass is. HR 103.

## 2017-05-29 NOTE — Progress Notes (Signed)
[facility] day:  05/29/2017   Chief Complaint: Cassidy Bradley is an 56 y.o. female with metastatic uterine leiomyosarcoma who is seen for reassessment after interval protocol enrollment at [facility].  HPI: The patient was last seen in the medical oncology clinic on 01/16/2017.  At that time, she had left sided chest wall pain secondary to tumor invasion of the chest wall.  Restaging scans revealed progression of disease.  We discussed consideration of enrollment on [facility] clinical trial (phase II ALLIANCE study of pazopanib versus [id number]).  If ineligible for clinical trial, we discussed initiation of pazopanib.  We discussed consideration of palliative radiation to chest wall if progressive disease unresponsive to therapy.   She signed consent for ALLIANCE phase II study (pazopanib vs. [id number]) on 02/10/2017. New baseline imaging was performed on 02/26/2017. Randomized to [id number]. Cycle #1 day 1 was on 03/05/2017. Study drug held 04/15/2017 for grade 3 nausea.  Chest, abdomen, and pelvic CT on 04/30/2017 revealed progressive disease.   She was seen by Dr. Angelina Ok at [facility] on 05/14/2017 and 05/28/2017. RECIST confirmed progressive disease. She was removed from the ALLIANCE study. Pazopanib as the next course of therapy was recommended.  Pain management was first improved.  Once controlled, recommendation was starting pazopanib at 400 mg daily and titrating, as tolerated, to 600 mg and then 800 mg daily (over an ~2 week time period).   She has had cancer related pain. Oxycontin was increased to 20 mg q12h.  She continued oxycodone 5 mg q 3hrs prn. She has kept a pain diary.  She has been on gabapentin 300 mg 3 times a day for 4 weeks. She has had issues with constipation.  She has used a daily stool softener and motility agent (ie. Senokot S).  She uses Miralax as needed.  She was seen in the [facility] ER on 05/26/2017 for pain. She was given a prescription for  hydromorphone 2 mg every 12 hours. She has continued her oxycodone 5 mg 1-2 every 3 hours. She notes pain in her back and epigastric area.  She is waking up at night with pain.  She has tried setting her alarm clock at night to take pain pills preemptively.  Oral intake has been poor. She is drinking boost and eating bananas. Food tastes bad.   Past Medical History:  Diagnosis Date  . Breast lump 2006    both breast   . Cancer ([place])    Uterine Leiomyosarcoma  . Chest wall mass   . DVT (deep venous thrombosis) ([facility])    Left leg  . Uterine cancer ([place]) 01/2015   chemo    Past Surgical History:  Procedure Laterality Date  . ABDOMINAL HYSTERECTOMY  01/30/15  . ANKLE SURGERY  2005  . BACK SURGERY  2005  . BREAST BIOPSY Bilateral 10 + yrs ago   neg  . CESAREAN SECTION  1993  . CHOLECYSTECTOMY  2001  . COLONOSCOPY     Family History  Problem Relation Age of Onset  . Colon polyps Mother   . Lupus Father   . Esophageal cancer Maternal Uncle   . Breast cancer Sister 32       fraternal twin sister  . Breast cancer Maternal Aunt   . Breast cancer Maternal Grandmother 60  . Stomach cancer Maternal Aunt   . Lung cancer Maternal Grandfather   . Cancer - Colon Cousin 2       first cousin  Social History:  reports that she has never smoked. She has never used smokeless tobacco. She reports that she does not drink alcohol or use drugs.   She lives in [place].  The patient is accompanied by her her friend, Cassidy Bradley, today.  Allergies:  Allergies  Allergen Reactions  . Pravastatin Hives and Nausea And Vomiting  . Sulfa Antibiotics Hives and Swelling  . Latex Rash  . Other Rash and Other (See Comments)    Pt states that she is allergic to spandex.   Current Medications: Current Outpatient Prescriptions  Medication Sig Dispense Refill  . docusate sodium (COLACE) 100 MG capsule Take 200 mg by mouth at bedtime.    . gabapentin (NEURONTIN) 300 MG capsule Take 300 mg by mouth 3  (three) times daily.    Marland Kitchen HYDROmorphone (DILAUDID) 2 MG tablet Take 1 tablet (2 mg total) by mouth every 12 (twelve) hours as needed for severe pain. 20 tablet 0  . lidocaine-prilocaine (EMLA) cream Apply 1 application topically as needed. Apply one hour prior to chemotherapy 30 g 1  . magnesium citrate SOLN Take 1 Bottle by mouth once.    . magnesium hydroxide (MILK OF MAGNESIA) 400 MG/5ML suspension Take 15 mLs by mouth daily as needed for mild constipation.    . Multiple Vitamins-Minerals (ALIVE WOMENS ENERGY) TABS Take 1 tablet by mouth daily.     . OXYCONTIN 10 MG 12 hr tablet Take 10 mg by mouth 3 (three) times daily.    . potassium chloride (KLOR-CON 10) 10 MEQ tablet Take 1 tablet (10 mEq total) by mouth daily. (Patient taking differently: Take 20 mEq by mouth daily. ) 30 tablet 0  . rivaroxaban (XARELTO) 20 MG TABS tablet Take 1 tablet by mouth  daily with supper 90 tablet 3  . senna-docusate (SENOKOT-S) 8.6-50 MG tablet Take 3 tablets by mouth at bedtime.    Marland Kitchen dexamethasone (DECADRON) 4 MG tablet Take 2 tablets by mouth once a day on the day after chemotherapy and then take 2 tablets two times a day for 2 days. Take with food. (Patient not taking: Reported on 05/26/2017) 30 tablet 1  . ferrous sulfate 325 (65 FE) MG tablet Take 1 tablet (325 mg total) by mouth 2 (two) times daily with a meal. (Patient not taking: Reported on 05/26/2017) 60 tablet 0  . oxyCODONE (OXY IR/ROXICODONE) 5 MG immediate release tablet Take 5-10 mg by mouth every 4 (four) hours as needed.    Marland Kitchen oxyCODONE-acetaminophen (PERCOCET/ROXICET) 5-325 MG tablet Take 1 tablet by mouth every 6 (six) hours as needed for severe pain. (Patient not taking: Reported on 05/26/2017) 30 tablet 0  . prochlorperazine (COMPAZINE) 10 MG tablet Take 1 tablet (10 mg total) by mouth every 6 (six) hours as needed (Nausea or vomiting). (Patient not taking: Reported on 12/25/2016) 30 tablet 1  . promethazine (PHENERGAN) 12.5 MG tablet Take 1 tablet  (12.5 mg total) by mouth every 6 (six) hours as needed for nausea or vomiting. (Patient not taking: Reported on 05/26/2017) 30 tablet 0  . VOTRIENT 200 MG tablet Take 200 mg by mouth 4 (four) times daily - after meals and at bedtime.     No current facility-administered medications for this visit.    Facility-Administered Medications Ordered in Other Visits  Medication Dose Route Frequency Provider Last Rate Last Dose  . 0.9 %  sodium chloride infusion   Intravenous Continuous Lequita Asal, MD   Stopped at 05/01/16 1340  . 0.9 %  sodium chloride  infusion   Intravenous Continuous Lequita Asal, MD   Stopped at 06/05/16 1235  . heparin lock flush 100 unit/mL  500 Units Intracatheter Once PRN Nolon Stalls C, MD      . heparin lock flush 100 unit/mL  500 Units Intracatheter Once PRN Nolon Stalls C, MD      . heparin lock flush 100 unit/mL  500 Units Intracatheter Once PRN Nolon Stalls C, MD      . heparin lock flush 100 unit/mL  500 Units Intracatheter Once PRN Nolon Stalls C, MD      . sodium chloride 0.9 % injection 10 mL  10 mL Intracatheter PRN Corcoran, Melissa C, MD      . sodium chloride flush (NS) 0.9 % injection 10 mL  10 mL Intracatheter PRN Nolon Stalls C, MD   10 mL at 04/03/16 1241  . sodium chloride flush (NS) 0.9 % injection 10 mL  10 mL Intracatheter PRN Nolon Stalls C, MD   10 mL at 05/01/16 1225  . sodium chloride flush (NS) 0.9 % injection 10 mL  10 mL Intracatheter PRN Nolon Stalls C, MD   10 mL at 06/05/16 1135  . sodium chloride flush (NS) 0.9 % injection 10 mL  10 mL Intracatheter PRN Nolon Stalls C, MD   10 mL at 07/02/16 1050  . sodium chloride flush (NS) 0.9 % injection 10 mL  10 mL Intravenous PRN Lequita Asal, MD   10 mL at 08/29/16 1449    Review of Systems:  GENERAL:  Fatigue.  No fevers or sweats. Weight up 8 pounds. PERFORMANCE STATUS (ECOG):  2 HEENT:  No visual changes,sore throat, mouth sores or  tenderness. Lungs:   No shortness of breath. Chronic cough.  No hemoptysis. Cardiac:  No chest pain, palpitations, orthopnea, or PND. GI:   No nausea, vomiting, constipation, melena, or hematochezia. GU:  No urgency, frequency, dysuria, or hematuria. Musculoskeletal:  Right shoulder, better.  Left sided chest wall pain.  No back pain. Extremities: Mild left lower extremity swelling, stable.  No pain. Skin:  No rashes or skin changes. Neuro:  No headache, numbness or weakness, balance or coordination issues. Endocrine:  No diabetes, thyroid issues, hot flashes or night sweats. Psych:  No mood changes, depression or anxiety. Pain:  Left sided chest wall pain. Review of systems:  All other systems reviewed and found to be negative.                        Physical Exam: Blood pressure 107/77, pulse (!) 103, temperature 97.5 F (36.4 C), temperature source Tympanic, resp. rate 18, weight 115 lb 4 oz (52.3 kg).  GENERAL: Thin, fatigued appearing woman sitting comfortably in a wheelchair in the exam room in no acute distress. MENTAL STATUS:  Alert and oriented to person, place and time. HEAD:  Braided short graying hair.  Normocephalic, atraumatic, face symmetric, no Cushingoid features. EYES:  Glasses.  Brown eyes.  Pupils equal round and reactive to light and accomodation.  No conjunctivitis or scleral icterus. ENT:  Oropharynx clear without lesion.  Tongue normal. Mucous membranes moist.  RESPIRATORY:  Clear to auscultation without rales, wheezes or rhonchi. CARDIOVASCULAR:  Regular rate and rhythm without murmur, rub or gallop. No JVD. ABDOMEN:  Soft, non-tender, with active bowel sounds, and no hepatosplenomegaly.  No masses. SKIN:  No rashes, ulcers or lesions. EXTREMITIES:  No lower extremity edema or palpable cords. LYMPH NODES:  No palpable cervical, supraclavicular,  axillary or inguinal adenopathy  NEUROLOGICAL: Appropriate. PSYCH:  Appropriate.   No visits with results within 3  Day(s) from this visit.  Latest known visit with results is:  Admission on 05/26/2017, Discharged on 05/26/2017  Component Date Value Ref Range Status  . Sodium 05/26/2017 130* 135 - 145 mmol/L Final  . Potassium 05/26/2017 3.4* 3.5 - 5.1 mmol/L Final  . Chloride 05/26/2017 92* 101 - 111 mmol/L Final  . CO2 05/26/2017 28  22 - 32 mmol/L Final  . Glucose, Bld 05/26/2017 122* 65 - 99 mg/dL Final  . BUN 05/26/2017 8  6 - 20 mg/dL Final  . Creatinine, Ser 05/26/2017 0.48  0.44 - 1.00 mg/dL Final  . Calcium 05/26/2017 9.5  8.9 - 10.3 mg/dL Final  . GFR calc non Af Amer 05/26/2017 >60  >60 mL/min Final  . GFR calc Af Amer 05/26/2017 >60  >60 mL/min Final   Comment: (NOTE) The eGFR has been calculated using the CKD EPI equation. This calculation has not been validated in all clinical situations. eGFR's persistently <60 mL/min signify possible Chronic Kidney Disease.   . Anion gap 05/26/2017 10  5 - 15 Final  . WBC 05/26/2017 10.2  3.6 - 11.0 K/uL Final  . RBC 05/26/2017 3.55* 3.80 - 5.20 MIL/uL Final  . Hemoglobin 05/26/2017 10.5* 12.0 - 16.0 g/dL Final  . HCT 05/26/2017 30.9* 35.0 - 47.0 % Final  . MCV 05/26/2017 86.8  80.0 - 100.0 fL Final  . MCH 05/26/2017 29.5  26.0 - 34.0 pg Final  . MCHC 05/26/2017 33.9  32.0 - 36.0 g/dL Final  . RDW 05/26/2017 17.1* 11.5 - 14.5 % Final  . Platelets 05/26/2017 317  150 - 440 K/uL Final  . Troponin I 05/26/2017 0.03* <0.03 ng/mL Final   Comment: CRITICAL RESULT CALLED TO, READ BACK BY AND VERIFIED WITH KATE BUMGARNER AT 4193 05/26/17 DAS   . Total Protein 05/26/2017 7.8  6.5 - 8.1 g/dL Final  . Albumin 05/26/2017 2.8* 3.5 - 5.0 g/dL Final  . AST 05/26/2017 54* 15 - 41 U/L Final  . ALT 05/26/2017 32  14 - 54 U/L Final  . Alkaline Phosphatase 05/26/2017 291* 38 - 126 U/L Final  . Total Bilirubin 05/26/2017 0.9  0.3 - 1.2 mg/dL Final  . Bilirubin, Direct 05/26/2017 0.2  0.1 - 0.5 mg/dL Final  . Indirect Bilirubin 05/26/2017 0.7  0.3 - 0.9 mg/dL  Final  . Lipase 05/26/2017 14  11 - 51 U/L Final   Assessment:  Cassidy Bradley is a 56 y.o. female 56 y.o. female with stage IV uterine leiomyosarcoma. She underwent TAH/BSO on 01/30/2015. Pathology confirmed high grade leiomyosarcoma with 2 large adjacent tumor nodules (10 cm and 5 cm) and a separate 0.5 cm serosal nodule.   Chest, abdomen, and pelvic CT scan on 02/12/2015 revealed a 1.7 x 2.3 cm right external iliac node. Pulmonary nodules were scattered throughout the lungs bilaterally. There was left internal mammary chain adenopathy measuring up to 3.6 x 2 cm.  She underwent anterior chest wall pleural-based CT guided biopsy on 02/21/2015. Pathology was consistent with metastatic leiomyosarcoma. Bone scan on 02/28/2015 revealed no evidence of metastatic disease with indeterminate uptake in the left posterior rib.   She has a fraternal twin who developed breast cancer at age 52. Several other family members have malignancies. 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  Prechemotherapy labs from [facility] revealed leukopenia (WBC 2300 with ANC 900). Work-up to  date is negative. She has a family history of leukopenia. Bone marrow on 03/08/2015 revealed no evidence of neoplasia or metastatic disease. Marrow was normocellular for age (30-50%) with trilineage hematopoiesis. There was no increase in marrow reticulin fibers. Flow cytometry and cytogenetics were normal.  She received 4 cycles of gemcitabine and Taxotere (03/20/2015 - 05/22/2015) with Neulasta support. She received gemcitabine alone on 05/22/2015.  She was admitted on 05/24/2015 with an acute febrile illness. All cultures were negative.   Chest CT on 05/25/2015 revealed early progressive disease. Abdominal and pelvic CT scan on 05/30/2015 revealed stable to slightly increased right external iliac lymph node (1.8 x 2.5 cm).   Echocardiogram  on 06/18/2015 revealed EF of 60-65%.  Echo on 10/22/2015 revealed an EF of 65%.  MUGA on 12/25/2015 revealed an EF of 52%.  Echo on 03/03/2016 revealed an EF of 50%.  Echo on 05/21/2016 revealed an EF of 55%.  Echo on 07/21/2016 revealed an EF of 45-50%.  Bilateral lower extremity duplex on 05/30/2015 was negative.  Left upper extremity duplex on 06/08/2015 was negative.  Left lower extremity duplex on 07/19/2015 documented a DVT in the popliteal vein.   Left lower extremity duplex on 01/31/2016 revealed re-cannulization of the prior DVT involving the left popliteal vein.  She is on Xarelto.   She received 8 cycles of adriamycin (06/07/2015 - 12/06/2015) with Neulasta (On-Pro) support.  Cycle #3 was complicated by an acute  left lower extremity DVT.  Cycle #6 was postponed a week secondary to neutropenia ([place] 700).  Zinecard was added with cycle #7.  Olaratumab [name] was added with cycle #8.  Chest, abdomen, and pelvic CT scan on 08/07/2015 revealed a partial treatment response with decrease anterior left upper pleural metastasis, stable to decreased pulmonary metastases and decreased right external iliac adenopathy. There were no new sites of disease.  Chest, abdomen, and pelvic CT scan on 10/22/2015 revealed mild decrease in left anterior chest wall mass/internal mammary lymphadenopathy (3.1 x 4.6 cm to 2.5 x 4.4 cm).  There was decreased mild right external iliac lymphadenopathy (1.4 cm to 1.2 cm).  There was diffuse stable bilateral pulmonary metastases (largest 1.1 cm in anterior right lung).  There was no new or progressive metastatic disease.  Chest CT on 12/25/2015 on revealed slight progression in pulmonary nodules (index nodules: 5 mm to 11 mm and 8 mm to 11 mm).  The dominant soft tissue lesion along the left hemi-thorax was 2.2 x 4.3 cm (stable).  She received 3 cycles of single agent olaratumab [name] (12/27/2015 -02/08/2016).  She required GCSF secondary to neutropenia.  PET scan  on 02/26/2016 revealed progression of numerous pulmonary nodules/metastasis and mild progression (1.2 cm with SUV 7) of right pelvic nodal metastasis.  In the right lung apex, there was a 1.3 x 1.0 cm (SUV 6.2) previously 0.9 x 0.6 cm.  In the anterior left lung was a 2.0 x 2.2 cm (SUV 12.4) previously 1.1 x 0.9 cm.  In the anterior right middle lobe was a 1.1 x 1.1 cm (SUV 2.9), previously 0.9 x 0.9 cm.  The subpleural mass along the anterior of the left upper lobe was 2.1 x 3.5 cm (not FDG avid), previously 2.2 x 3.4 cm.  Bone scan on 03/05/2016 revealed no evidence of metastatic disease.  She received 5 cycles of trabectedin (Yondelis) (03/13/2016 - 07/02/2016) with Neulasta support.  She developed rhabdomyolysis with cycle #5 (CPK 5000).    Chest CT on 04/28/2016 revealed innumerable pulmonary metastases which had  slightly increased (49m) in size since 02/26/2016.  PET scan on 08/01/2016 revealed that most of the pulmonary nodules were smaller and demonstrated decrease in metabolic activity. However, the largest lesion in the left upper lobe had increased in metabolic activity (SUV max increased from 12.4 to 17.4) but was fairly similar in size (2.6 cm now compared to 2.3 cm).  There was no mediastinal or hilar hypermetabolic adenopathy.  The right pelvic side wais stable in size (SUV max decreased from 6.9 to 2.9).  There was no new abdominal or pelvic lymphadenopathy.  Chest, abdomen, and pelvic CT scan on 11/25/2016 revealed an interval increase in the size of the dominant mass in the left upper lobe (4.2 x 2.9 cm). There was interval increase in size of innumerable bilateral pulmonary nodules. There was interval increase in the size of the right hilar lymph node (1.5 cm). There was interval increase in the size of hepatic hypodensities (nodules 8-9 mm) highly concerning for hepatic metastasis. There was a stable small pelvic sidewall lesion (1.1 cm).  She received 2 cycles of decarbazine  (11/28/2016 - 12/25/2016) with Neulasta support.  Chest, abdomen, and pelvic CT scan on 01/13/2017 revealed progression of metastatic disease with interval increase in number and size of numerous pulmonary nodules and masses bilaterally, slight increase in size of numerous hepatic metastasis, new 1.7 cm right adrenal metastasis, and probable 1.1 cm pancreatic metastasis in the proximal body of the pancreas.  The largest pulmonary nodule measured 6.1 cm x 3.8 cm in the anterior medial left upper lobe which appeared to invade the anterior chest wall.  She enrolled on the ALLIANCE phase II study (pazopanib vs. [id number] on 02/10/2017. She randomized to [id number] Cycle #1 began on 03/05/2017.  Chest, abdomen, and pelvic CT on 04/30/2017 revealed progressive disease.  Symptomatically, she has lost weight.  Pain is poorly controlled.  Exam reveals significant debilitation.  Plan: 1.  Labs today:  CBC with diff, CMP, Mg. 2.  Discuss interval history and clinical trial enrollment.  Discuss progression of disease.  Discuss initiation of pazopanib (start with low dose). 3.  Discuss weight loss.  Discuss caloric intake and appetite stimulant. Side effects reviewed. 4.  Rx: Megace 200 mg a day. 5.  Increase Oxycontin to 20 mg q AM and 30 mg q PM. 6.  Discontinue Dilaudid.  Continue pain diary. 7.  Please provide patient with code status papers/living will. 8.  RTC in 1 week for MD assessment and labs (CBC with diff, CMP).   MLequita Asal MD 05/29/2017, 11:29 AM

## 2017-06-11 ENCOUNTER — Inpatient Hospital Stay (HOSPITAL_BASED_OUTPATIENT_CLINIC_OR_DEPARTMENT_OTHER): Payer: 59 | Admitting: Hematology and Oncology

## 2017-06-11 ENCOUNTER — Inpatient Hospital Stay: Payer: 59

## 2017-06-11 VITALS — BP 110/78 | HR 117 | Temp 98.6°F | Resp 18

## 2017-06-11 DIAGNOSIS — M6282 Rhabdomyolysis: Secondary | ICD-10-CM | POA: Diagnosis not present

## 2017-06-11 DIAGNOSIS — R112 Nausea with vomiting, unspecified: Secondary | ICD-10-CM | POA: Diagnosis not present

## 2017-06-11 DIAGNOSIS — Z86718 Personal history of other venous thrombosis and embolism: Secondary | ICD-10-CM | POA: Diagnosis not present

## 2017-06-11 DIAGNOSIS — C55 Malignant neoplasm of uterus, part unspecified: Secondary | ICD-10-CM

## 2017-06-11 DIAGNOSIS — G893 Neoplasm related pain (acute) (chronic): Secondary | ICD-10-CM

## 2017-06-11 DIAGNOSIS — C7800 Secondary malignant neoplasm of unspecified lung: Secondary | ICD-10-CM

## 2017-06-11 DIAGNOSIS — C782 Secondary malignant neoplasm of pleura: Secondary | ICD-10-CM

## 2017-06-11 DIAGNOSIS — C787 Secondary malignant neoplasm of liver and intrahepatic bile duct: Secondary | ICD-10-CM

## 2017-06-11 DIAGNOSIS — C775 Secondary and unspecified malignant neoplasm of intrapelvic lymph nodes: Secondary | ICD-10-CM

## 2017-06-11 DIAGNOSIS — C7801 Secondary malignant neoplasm of right lung: Secondary | ICD-10-CM

## 2017-06-11 DIAGNOSIS — Z79899 Other long term (current) drug therapy: Secondary | ICD-10-CM

## 2017-06-11 DIAGNOSIS — R634 Abnormal weight loss: Secondary | ICD-10-CM | POA: Diagnosis not present

## 2017-06-11 DIAGNOSIS — R630 Anorexia: Secondary | ICD-10-CM

## 2017-06-11 DIAGNOSIS — Z7901 Long term (current) use of anticoagulants: Secondary | ICD-10-CM

## 2017-06-11 DIAGNOSIS — K5900 Constipation, unspecified: Secondary | ICD-10-CM

## 2017-06-11 DIAGNOSIS — Z7189 Other specified counseling: Secondary | ICD-10-CM | POA: Insufficient documentation

## 2017-06-11 DIAGNOSIS — C7802 Secondary malignant neoplasm of left lung: Secondary | ICD-10-CM

## 2017-06-11 LAB — COMPREHENSIVE METABOLIC PANEL
ALT: 18 U/L (ref 14–54)
AST: 40 U/L (ref 15–41)
Albumin: 2.6 g/dL — ABNORMAL LOW (ref 3.5–5.0)
Alkaline Phosphatase: 304 U/L — ABNORMAL HIGH (ref 38–126)
Anion gap: 12 (ref 5–15)
BUN: 10 mg/dL (ref 6–20)
CO2: 24 mmol/L (ref 22–32)
Calcium: 9.2 mg/dL (ref 8.9–10.3)
Chloride: 94 mmol/L — ABNORMAL LOW (ref 101–111)
Creatinine, Ser: 0.59 mg/dL (ref 0.44–1.00)
GFR calc Af Amer: >60 mL/min (ref >60)
GFR calc non Af Amer: >60 mL/min (ref >60)
Glucose, Bld: 93 mg/dL (ref 65–99)
Potassium: 3.8 mmol/L (ref 3.5–5.1)
Sodium: 130 mmol/L — ABNORMAL LOW (ref 135–145)
Total Bilirubin: 0.7 mg/dL (ref 0.3–1.2)
Total Protein: 7.8 g/dL (ref 6.5–8.1)

## 2017-06-11 LAB — CBC WITH DIFFERENTIAL/PLATELET
Basophils Absolute: 0.1 10*3/uL (ref 0–0.1)
Basophils Relative: 1 %
Eosinophils Absolute: 0 10*3/uL (ref 0–0.7)
Eosinophils Relative: 0 %
HCT: 30.5 % — ABNORMAL LOW (ref 35.0–47.0)
Hemoglobin: 10.2 g/dL — ABNORMAL LOW (ref 12.0–16.0)
Lymphocytes Relative: 7 %
Lymphs Abs: 0.4 10*3/uL — ABNORMAL LOW (ref 1.0–3.6)
MCH: 28.5 pg (ref 26.0–34.0)
MCHC: 33.4 g/dL (ref 32.0–36.0)
MCV: 85.3 fL (ref 80.0–100.0)
Monocytes Absolute: 0.4 10*3/uL (ref 0.2–0.9)
Monocytes Relative: 7 %
Neutro Abs: 5.3 10*3/uL (ref 1.4–6.5)
Neutrophils Relative %: 85 %
Platelets: 315 10*3/uL (ref 150–440)
RBC: 3.57 MIL/uL — ABNORMAL LOW (ref 3.80–5.20)
RDW: 17.9 % — ABNORMAL HIGH (ref 11.5–14.5)
WBC: 6.2 10*3/uL (ref 3.6–11.0)

## 2017-06-11 MED ORDER — OXYCODONE HCL 5 MG PO TABS: 5.0000 mg | ORAL_TABLET | ORAL | 0 refills | Status: AC | PRN

## 2017-06-11 MED ORDER — OXYCODONE HCL 5 MG PO TABS: 5.0000 mg | ORAL_TABLET | ORAL | 0 refills | Status: DC | PRN

## 2017-06-11 NOTE — Progress Notes (Signed)
Patient has not been able to keep food down since yesterday.  She tried applesauce but could not keep it down.  Tolerates boost okay.  States she has almost no energy.  Has maybe 2 bowel movements a week.  Patient presents in wheelchair today accompanied by her husband, Dellis Filbert.

## 2017-06-11 NOTE — Progress Notes (Signed)
[facility] day:  06/11/2017   Chief Complaint: Cassidy Bradley is an 56 y.o. female with metastatic uterine leiomyosarcoma who is seen for 2 week assessment on pazopanib.  HPI: The patient was last seen in the medical oncology clinic on 05/29/2017.  At that time, she was seen for reassessment after progression on ALLIANCE phase II study at [facility].  Pain was poorly controlled.  She was waking up at night in pain.  She brought in her pain dairy.  Oxycontin was increased to 20 mg q AM and 30 mg q PM.  She continued oxycodone for break through pain.  She was taking gabapentin 300 mg TID.  She started pazopanib.  She took 600 mg x 1 week and 1 dose of 800 mg.  She states that she had a rough night last night.  Boost and apple sauce came up (emesis).  She had a bowel movement this AM.  She is on a bowel regimen for constipation.    Regarding her pain, she is sleeping a lot better.  She takes 1 short acting oxycodone in the morning and 1 in the evening.  She notes left groin pain.  She is drinking 1-2 Boosts/day.   Past Medical History:  Diagnosis Date  . Breast lump 2006    both breast   . Cancer ([place])    Uterine Leiomyosarcoma  . Chest wall mass   . DVT (deep venous thrombosis) ([facility])    Left leg  . Uterine cancer ([place]) 01/2015   chemo    Past Surgical History:  Procedure Laterality Date  . ABDOMINAL HYSTERECTOMY  01/30/15  . ANKLE SURGERY  2005  . BACK SURGERY  2005  . BREAST BIOPSY Bilateral 10 + yrs ago   neg  . CESAREAN SECTION  1993  . CHOLECYSTECTOMY  2001  . COLONOSCOPY     Family History  Problem Relation Age of Onset  . Colon polyps Mother   . Lupus Father   . Esophageal cancer Maternal Uncle   . Breast cancer Sister 68       fraternal twin sister  . Breast cancer Maternal Aunt   . Breast cancer Maternal Grandmother 60  . Stomach cancer Maternal Aunt   . Lung cancer Maternal Grandfather   . Cancer - Colon Cousin 76        first cousin    Social History:  reports that she has never smoked. She has never used smokeless tobacco. She reports that she does not drink alcohol or use drugs.   She lives in [place].  The patient is accompanied by her husband today.  Allergies:  Allergies  Allergen Reactions  . Pravastatin Hives and Nausea And Vomiting  . Sulfa Antibiotics Hives and Swelling  . Latex Rash  . Other Rash and Other (See Comments)    Pt states that she is allergic to spandex.   Current Medications: Current Outpatient Prescriptions  Medication Sig Dispense Refill  . docusate sodium (COLACE) 100 MG capsule Take 200 mg by mouth at bedtime.    . gabapentin (NEURONTIN) 300 MG capsule Take 300 mg by mouth 3 (three) times daily.    Marland Kitchen HYDROmorphone (DILAUDID) 2 MG tablet Take 1 tablet (2 mg total) by mouth every 12 (twelve) hours as needed for severe pain. 20 tablet 0  . lidocaine-prilocaine (EMLA) cream Apply 1 application topically as needed. Apply one hour prior to chemotherapy 30 g 1  . magnesium citrate SOLN Take  1 Bottle by mouth once.    . magnesium hydroxide (MILK OF MAGNESIA) 400 MG/5ML suspension Take 15 mLs by mouth daily as needed for mild constipation.    . megestrol (MEGACE) 400 MG/10ML suspension Take 5 mLs (200 mg total) by mouth daily. 240 mL 0  . Multiple Vitamins-Minerals (ALIVE WOMENS ENERGY) TABS Take 1 tablet by mouth daily.     Marland Kitchen oxyCODONE (OXY IR/ROXICODONE) 5 MG immediate release tablet Take 5-10 mg by mouth every 4 (four) hours as needed.    . OXYCONTIN 10 MG 12 hr tablet Take 10 mg by mouth 3 (three) times daily.    . potassium chloride (KLOR-CON 10) 10 MEQ tablet Take 1 tablet (10 mEq total) by mouth daily. (Patient taking differently: Take 20 mEq by mouth daily. ) 30 tablet 0  . rivaroxaban (XARELTO) 20 MG TABS tablet Take 1 tablet by mouth  daily with supper 90 tablet 3  . senna-docusate (SENOKOT-S) 8.6-50 MG tablet Take 3 tablets by mouth at bedtime.    Marland Kitchen VOTRIENT 200 MG tablet  Take 200 mg by mouth 4 (four) times daily - after meals and at bedtime.    Marland Kitchen dexamethasone (DECADRON) 4 MG tablet Take 2 tablets by mouth once a day on the day after chemotherapy and then take 2 tablets two times a day for 2 days. Take with food. (Patient not taking: Reported on 05/26/2017) 30 tablet 1  . ferrous sulfate 325 (65 FE) MG tablet Take 1 tablet (325 mg total) by mouth 2 (two) times daily with a meal. (Patient not taking: Reported on 05/26/2017) 60 tablet 0  . oxyCODONE-acetaminophen (PERCOCET/ROXICET) 5-325 MG tablet Take 1 tablet by mouth every 6 (six) hours as needed for severe pain. (Patient not taking: Reported on 05/26/2017) 30 tablet 0  . prochlorperazine (COMPAZINE) 10 MG tablet Take 1 tablet (10 mg total) by mouth every 6 (six) hours as needed (Nausea or vomiting). (Patient not taking: Reported on 12/25/2016) 30 tablet 1  . promethazine (PHENERGAN) 12.5 MG tablet Take 1 tablet (12.5 mg total) by mouth every 6 (six) hours as needed for nausea or vomiting. (Patient not taking: Reported on 05/26/2017) 30 tablet 0   No current facility-administered medications for this visit.    Facility-Administered Medications Ordered in Other Visits  Medication Dose Route Frequency Provider Last Rate Last Dose  . 0.9 %  sodium chloride infusion   Intravenous Continuous Lequita Asal, MD   Stopped at 05/01/16 1340  . 0.9 %  sodium chloride infusion   Intravenous Continuous Lequita Asal, MD   Stopped at 06/05/16 1235  . heparin lock flush 100 unit/mL  500 Units Intracatheter Once PRN Nolon Stalls C, MD      . heparin lock flush 100 unit/mL  500 Units Intracatheter Once PRN Nolon Stalls C, MD      . heparin lock flush 100 unit/mL  500 Units Intracatheter Once PRN Nolon Stalls C, MD      . heparin lock flush 100 unit/mL  500 Units Intracatheter Once PRN Nolon Stalls C, MD      . sodium chloride 0.9 % injection 10 mL  10 mL Intracatheter PRN Corcoran, Melissa C, MD      .  sodium chloride flush (NS) 0.9 % injection 10 mL  10 mL Intracatheter PRN Nolon Stalls C, MD   10 mL at 04/03/16 1241  . sodium chloride flush (NS) 0.9 % injection 10 mL  10 mL Intracatheter PRN Lequita Asal, MD  10 mL at 05/01/16 1225  . sodium chloride flush (NS) 0.9 % injection 10 mL  10 mL Intracatheter PRN Nelva Nay C, MD   10 mL at 06/05/16 1135  . sodium chloride flush (NS) 0.9 % injection 10 mL  10 mL Intracatheter PRN Nelva Nay C, MD   10 mL at 07/02/16 1050  . sodium chloride flush (NS) 0.9 % injection 10 mL  10 mL Intravenous PRN Rosey Bath, MD   10 mL at 08/29/16 1449    Review of Systems:  GENERAL:  Fatigue.  No fevers or sweats. No new weight. PERFORMANCE STATUS (ECOG):  2-3 HEENT:  No visual changes,sore throat, mouth sores or tenderness. Lungs:   No shortness of breath. Chronic cough.  No hemoptysis. Cardiac:  No chest pain, palpitations, orthopnea, or PND. GI:   Nausea and vomiting last night.  No constipation, melena, or hematochezia. On a bowel regimen. GU:  No urgency, frequency, dysuria, or hematuria. Musculoskeletal: Back pain. Left sided chest wall pain.   Extremities: Mild left lower extremity swelling, stable.  No pain. Skin:  No rashes or skin changes. Neuro:  No headache, numbness or weakness, balance or coordination issues. Endocrine:  No diabetes, thyroid issues, hot flashes or night sweats. Psych:  No mood changes, depression or anxiety.  Sleeping better. Pain:  Back pain. Review of systems:  All other systems reviewed and found to be negative.                        Physical Exam: Blood pressure 110/78, pulse (!) 117, temperature 98.6 F (37 C), temperature source Tympanic, resp. rate 18.  GENERAL:  Thin, fatigued appearing woman sitting comfortably in a wheelchair in the exam room in no acute distress. MENTAL STATUS:  Alert and oriented to person, place and time. HEAD:  Curly graying hair in braids.  Normocephalic,  atraumatic, face symmetric, no Cushingoid features. EYES:  Glasses.  Brown eyes.  Pupils equal round and reactive to light and accomodation.  No conjunctivitis or scleral icterus. ENT:  Oropharynx clear without lesion.  Tongue normal. Mucous membranes moist.  RESPIRATORY:  Clear to auscultation without rales, wheezes or rhonchi. CARDIOVASCULAR:  Regular rate and rhythm without murmur, rub or gallop. No JVD. ABDOMEN:  Soft, non-tender, with active bowel sounds, and no hepatosplenomegaly.  No masses. SKIN:  No rashes, ulcers or lesions. EXTREMITIES:  Trace left lower extremity edema below the knee.  LYMPH NODES:  No palpable cervical, supraclavicular, axillary or inguinal adenopathy  NEUROLOGICAL: Appropriate. PSYCH:  Appropriate.   Appointment on 06/11/2017  Component Date Value Ref Range Status  . WBC 06/11/2017 6.2  3.6 - 11.0 K/uL Final  . RBC 06/11/2017 3.57* 3.80 - 5.20 MIL/uL Final  . Hemoglobin 06/11/2017 10.2* 12.0 - 16.0 g/dL Final  . HCT 85/48/8301 30.5* 35.0 - 47.0 % Final  . MCV 06/11/2017 85.3  80.0 - 100.0 fL Final  . MCH 06/11/2017 28.5  26.0 - 34.0 pg Final  . MCHC 06/11/2017 33.4  32.0 - 36.0 g/dL Final  . RDW 41/59/7331 17.9* 11.5 - 14.5 % Final  . Platelets 06/11/2017 315  150 - 440 K/uL Final  . Neutrophils Relative % 06/11/2017 85  % Final  . Neutro Abs 06/11/2017 5.3  1.4 - 6.5 K/uL Final  . Lymphocytes Relative 06/11/2017 7  % Final  . Lymphs Abs 06/11/2017 0.4* 1.0 - 3.6 K/uL Final  . Monocytes Relative 06/11/2017 7  % Final  . Monocytes  Absolute 06/11/2017 0.4  0.2 - 0.9 K/uL Final  . Eosinophils Relative 06/11/2017 0  % Final  . Eosinophils Absolute 06/11/2017 0.0  0 - 0.7 K/uL Final  . Basophils Relative 06/11/2017 1  % Final  . Basophils Absolute 06/11/2017 0.1  0 - 0.1 K/uL Final  . Sodium 06/11/2017 130* 135 - 145 mmol/L Final  . Potassium 06/11/2017 3.8  3.5 - 5.1 mmol/L Final  . Chloride 06/11/2017 94* 101 - 111 mmol/L Final  . CO2 06/11/2017 24  22  - 32 mmol/L Final  . Glucose, Bld 06/11/2017 93  65 - 99 mg/dL Final  . BUN 06/11/2017 10  6 - 20 mg/dL Final  . Creatinine, Ser 06/11/2017 0.59  0.44 - 1.00 mg/dL Final  . Calcium 06/11/2017 9.2  8.9 - 10.3 mg/dL Final  . Total Protein 06/11/2017 7.8  6.5 - 8.1 g/dL Final  . Albumin 06/11/2017 2.6* 3.5 - 5.0 g/dL Final  . AST 06/11/2017 40  15 - 41 U/L Final  . ALT 06/11/2017 18  14 - 54 U/L Final  . Alkaline Phosphatase 06/11/2017 304* 38 - 126 U/L Final  . Total Bilirubin 06/11/2017 0.7  0.3 - 1.2 mg/dL Final  . GFR calc non Af Amer 06/11/2017 >60  >60 mL/min Final  . GFR calc Af Amer 06/11/2017 >60  >60 mL/min Final   Comment: (NOTE) The eGFR has been calculated using the CKD EPI equation. This calculation has not been validated in all clinical situations. eGFR's persistently <60 mL/min signify possible Chronic Kidney Disease.   Cassidy Bradley gap 06/11/2017 12  5 - 15 Final   Assessment:  ZYIONNA PESCE is a 56 y.o. female 55 y.o. female with stage IV uterine leiomyosarcoma. She underwent TAH/BSO on 01/30/2015. Pathology confirmed high grade leiomyosarcoma with 2 large adjacent tumor nodules (10 cm and 5 cm) and a separate 0.5 cm serosal nodule.   Chest, abdomen, and pelvic CT scan on 02/12/2015 revealed a 1.7 x 2.3 cm right external iliac node. Pulmonary nodules were scattered throughout the lungs bilaterally. There was left internal mammary chain adenopathy measuring up to 3.6 x 2 cm.  She underwent anterior chest wall pleural-based CT guided biopsy on 02/21/2015. Pathology was consistent with metastatic leiomyosarcoma. Bone scan on 02/28/2015 revealed no evidence of metastatic disease with indeterminate uptake in the left posterior rib.   She has a fraternal twin who developed breast cancer at age 56. Several other family members have malignancies. 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  Prechemotherapy labs from [facility] revealed leukopenia (WBC 2300 with ANC 900). Work-up to date is negative. She has a family history of leukopenia. Bone marrow on 03/08/2015 revealed no evidence of neoplasia or metastatic disease. Marrow was normocellular for age (30-50%) with trilineage hematopoiesis. There was no increase in marrow reticulin fibers. Flow cytometry and cytogenetics were normal.  She received 4 cycles of gemcitabine and Taxotere (03/20/2015 - 05/22/2015) with Neulasta support. She received gemcitabine alone on 05/22/2015.  She was admitted on 05/24/2015 with an acute febrile illness. All cultures were negative.   Chest CT on 05/25/2015 revealed early progressive disease. Abdominal and pelvic CT scan on 05/30/2015 revealed stable to slightly increased right external iliac lymph node (1.8 x 2.5 cm).   Echocardiogram on 06/18/2015 revealed EF of 60-65%.  Echo on 10/22/2015 revealed an EF of 65%.  MUGA on 12/25/2015 revealed an EF of 52%.  Echo on 03/03/2016 revealed an EF of 50%.  Echo on 05/21/2016 revealed an EF of 55%.  Echo on 07/21/2016 revealed an EF of 45-50%.  Bilateral lower extremity duplex on 05/30/2015 was negative.  Left upper extremity duplex on 06/08/2015 was negative.  Left lower extremity duplex on 07/19/2015 documented a DVT in the popliteal vein.   Left lower extremity duplex on 01/31/2016 revealed re-cannulization of the prior DVT involving the left popliteal vein.  She is on Xarelto.   She received 8 cycles of adriamycin (06/07/2015 - 12/06/2015) with Neulasta (On-Pro) support.  Cycle #3 was complicated by an acute  left lower extremity DVT.  Cycle #6 was postponed a week secondary to neutropenia (ANC 700).  Zinecard was added with cycle #7.  Olaratumab Drucilla Schmidt) was added with cycle #8.  Chest, abdomen, and pelvic CT scan on 08/07/2015 revealed a partial treatment response with decrease anterior left upper pleural metastasis, stable to  decreased pulmonary metastases and decreased right external iliac adenopathy. There were no new sites of disease.  Chest, abdomen, and pelvic CT scan on 10/22/2015 revealed mild decrease in left anterior chest wall mass/internal mammary lymphadenopathy (3.1 x 4.6 cm to 2.5 x 4.4 cm).  There was decreased mild right external iliac lymphadenopathy (1.4 cm to 1.2 cm).  There was diffuse stable bilateral pulmonary metastases (largest 1.1 cm in anterior right lung).  There was no new or progressive metastatic disease.  Chest CT on 12/25/2015 on revealed slight progression in pulmonary nodules (index nodules: 5 mm to 11 mm and 8 mm to 11 mm).  The dominant soft tissue lesion along the left hemi-thorax was 2.2 x 4.3 cm (stable).  She received 3 cycles of single agent olaratumab Drucilla Schmidt) (12/27/2015 -02/08/2016).  She required GCSF secondary to neutropenia.  PET scan on 02/26/2016 revealed progression of numerous pulmonary nodules/metastasis and mild progression (1.2 cm with SUV 7) of right pelvic nodal metastasis.  In the right lung apex, there was a 1.3 x 1.0 cm (SUV 6.2) previously 0.9 x 0.6 cm.  In the anterior left lung was a 2.0 x 2.2 cm (SUV 12.4) previously 1.1 x 0.9 cm.  In the anterior right middle lobe was a 1.1 x 1.1 cm (SUV 2.9), previously 0.9 x 0.9 cm.  The subpleural mass along the anterior of the left upper lobe was 2.1 x 3.5 cm (not FDG avid), previously 2.2 x 3.4 cm.  Bone scan on 03/05/2016 revealed no evidence of metastatic disease.  She received 5 cycles of trabectedin (Yondelis) (03/13/2016 - 07/02/2016) with Neulasta support.  She developed rhabdomyolysis with cycle #5 (CPK 5000).    Chest CT on 04/28/2016 revealed innumerable pulmonary metastases which had slightly increased (73mm) in size since 02/26/2016.  PET scan on 08/01/2016 revealed that most of the pulmonary nodules were smaller and demonstrated decrease in metabolic activity. However, the largest lesion in the left upper lobe  had increased in metabolic activity (SUV max increased from 12.4 to 17.4) but was fairly similar in size (2.6 cm now compared to 2.3 cm).  There was no mediastinal or hilar hypermetabolic adenopathy.  The right pelvic side wais stable in size (SUV max decreased from 6.9 to 2.9).  There was no new abdominal or pelvic lymphadenopathy.  Chest, abdomen, and pelvic CT scan on 11/25/2016 revealed an interval increase in the size of the dominant mass in the left upper lobe (4.2 x 2.9 cm). There was interval increase in size of innumerable bilateral pulmonary nodules. There was interval increase in the size of the right hilar lymph node (1.5  cm). There was interval increase in the size of hepatic hypodensities (nodules 8-9 mm) highly concerning for hepatic metastasis. There was a stable small pelvic sidewall lesion (1.1 cm).  She received 2 cycles of decarbazine (11/28/2016 - 12/25/2016) with Neulasta support.  Chest, abdomen, and pelvic CT scan on 01/13/2017 revealed progression of metastatic disease with interval increase in number and size of numerous pulmonary nodules and masses bilaterally, slight increase in size of numerous hepatic metastasis, new 1.7 cm right adrenal metastasis, and probable 1.1 cm pancreatic metastasis in the proximal body of the pancreas.  The largest pulmonary nodule measured 6.1 cm x 3.8 cm in the anterior medial left upper lobe which appeared to invade the anterior chest wall.  She enrolled on the ALLIANCE phase II study (pazopanib vs. [id number]) on 02/10/2017. She randomized to [id number]. Cycle #1 began on 03/05/2017.  Chest, abdomen, and pelvic CT on 04/30/2017 revealed progressive disease.  There were new and enlarging confluent lung metastasis, enlarging pancreatic neck mass, new tumor thrombus in the main portal vein, new and enlarging liver metastasis, enlarging right adrenal metastasis, new right paracolic gutter metastatic implants, and enlarging body wall metastatic implants (right  gluteal region and left lateral abdominal wall).  Symptomatically, pain is well controlled.  She had 2 episodes of nausea/vomiting.  She denies constipation.  Plan: 1.  Labs today:  CBC with diff, CMP, Mg. 2.  Discuss pain management.  Pain appears well controlled.  Continue pain diary. 3.  Discuss caloric intake.  Increased nausea and vomiting appear due to rapid increase in pazopanib dosing.  Return to 400 mg a day.  Advance slowly. 4.  Discuss importance of fluid and caloric intake. 5.  Patient instructed to contact clinic if not doing well. 6.  RTC in 1 week for MD assessment and labs (CBC with diff, CMP).   Lequita Asal, MD 06/11/2017, 4:27 PM

## 2017-06-13 ENCOUNTER — Emergency Department: Payer: 59

## 2017-06-13 ENCOUNTER — Inpatient Hospital Stay: Payer: 59

## 2017-06-13 ENCOUNTER — Other Ambulatory Visit: Payer: Self-pay

## 2017-06-13 ENCOUNTER — Inpatient Hospital Stay
Admission: EM | Admit: 2017-06-13 | Discharge: 2017-06-28 | DRG: 871 | Disposition: E | Payer: 59 | Attending: Internal Medicine | Admitting: Internal Medicine

## 2017-06-13 ENCOUNTER — Encounter: Payer: Self-pay | Admitting: Emergency Medicine

## 2017-06-13 DIAGNOSIS — Z882 Allergy status to sulfonamides status: Secondary | ICD-10-CM

## 2017-06-13 DIAGNOSIS — E43 Unspecified severe protein-calorie malnutrition: Secondary | ICD-10-CM | POA: Diagnosis present

## 2017-06-13 DIAGNOSIS — Z9221 Personal history of antineoplastic chemotherapy: Secondary | ICD-10-CM

## 2017-06-13 DIAGNOSIS — N170 Acute kidney failure with tubular necrosis: Secondary | ICD-10-CM | POA: Diagnosis present

## 2017-06-13 DIAGNOSIS — Z8542 Personal history of malignant neoplasm of other parts of uterus: Secondary | ICD-10-CM

## 2017-06-13 DIAGNOSIS — R0602 Shortness of breath: Secondary | ICD-10-CM

## 2017-06-13 DIAGNOSIS — C55 Malignant neoplasm of uterus, part unspecified: Secondary | ICD-10-CM | POA: Diagnosis not present

## 2017-06-13 DIAGNOSIS — R64 Cachexia: Secondary | ICD-10-CM | POA: Diagnosis present

## 2017-06-13 DIAGNOSIS — I959 Hypotension, unspecified: Secondary | ICD-10-CM | POA: Diagnosis present

## 2017-06-13 DIAGNOSIS — Z91040 Latex allergy status: Secondary | ICD-10-CM

## 2017-06-13 DIAGNOSIS — C7971 Secondary malignant neoplasm of right adrenal gland: Secondary | ICD-10-CM | POA: Diagnosis present

## 2017-06-13 DIAGNOSIS — R627 Adult failure to thrive: Secondary | ICD-10-CM

## 2017-06-13 DIAGNOSIS — Z66 Do not resuscitate: Secondary | ICD-10-CM

## 2017-06-13 DIAGNOSIS — Z888 Allergy status to other drugs, medicaments and biological substances status: Secondary | ICD-10-CM

## 2017-06-13 DIAGNOSIS — E860 Dehydration: Secondary | ICD-10-CM | POA: Diagnosis present

## 2017-06-13 DIAGNOSIS — Z90710 Acquired absence of both cervix and uterus: Secondary | ICD-10-CM | POA: Diagnosis not present

## 2017-06-13 DIAGNOSIS — C7802 Secondary malignant neoplasm of left lung: Secondary | ICD-10-CM | POA: Diagnosis present

## 2017-06-13 DIAGNOSIS — C772 Secondary and unspecified malignant neoplasm of intra-abdominal lymph nodes: Secondary | ICD-10-CM | POA: Diagnosis present

## 2017-06-13 DIAGNOSIS — M6282 Rhabdomyolysis: Secondary | ICD-10-CM | POA: Diagnosis not present

## 2017-06-13 DIAGNOSIS — Z803 Family history of malignant neoplasm of breast: Secondary | ICD-10-CM

## 2017-06-13 DIAGNOSIS — A419 Sepsis, unspecified organism: Secondary | ICD-10-CM | POA: Diagnosis present

## 2017-06-13 DIAGNOSIS — C787 Secondary malignant neoplasm of liver and intrahepatic bile duct: Secondary | ICD-10-CM | POA: Diagnosis present

## 2017-06-13 DIAGNOSIS — E871 Hypo-osmolality and hyponatremia: Secondary | ICD-10-CM | POA: Diagnosis present

## 2017-06-13 DIAGNOSIS — Z515 Encounter for palliative care: Secondary | ICD-10-CM

## 2017-06-13 DIAGNOSIS — C775 Secondary and unspecified malignant neoplasm of intrapelvic lymph nodes: Secondary | ICD-10-CM | POA: Diagnosis not present

## 2017-06-13 DIAGNOSIS — R0600 Dyspnea, unspecified: Secondary | ICD-10-CM | POA: Diagnosis present

## 2017-06-13 DIAGNOSIS — Z86718 Personal history of other venous thrombosis and embolism: Secondary | ICD-10-CM | POA: Diagnosis not present

## 2017-06-13 DIAGNOSIS — Z79899 Other long term (current) drug therapy: Secondary | ICD-10-CM

## 2017-06-13 DIAGNOSIS — D696 Thrombocytopenia, unspecified: Secondary | ICD-10-CM | POA: Diagnosis present

## 2017-06-13 DIAGNOSIS — C7801 Secondary malignant neoplasm of right lung: Secondary | ICD-10-CM | POA: Diagnosis present

## 2017-06-13 DIAGNOSIS — Z7901 Long term (current) use of anticoagulants: Secondary | ICD-10-CM | POA: Diagnosis not present

## 2017-06-13 DIAGNOSIS — Z832 Family history of diseases of the blood and blood-forming organs and certain disorders involving the immune mechanism: Secondary | ICD-10-CM

## 2017-06-13 DIAGNOSIS — Z79818 Long term (current) use of other agents affecting estrogen receptors and estrogen levels: Secondary | ICD-10-CM

## 2017-06-13 DIAGNOSIS — J189 Pneumonia, unspecified organism: Secondary | ICD-10-CM

## 2017-06-13 DIAGNOSIS — I10 Essential (primary) hypertension: Secondary | ICD-10-CM | POA: Diagnosis present

## 2017-06-13 DIAGNOSIS — C782 Secondary malignant neoplasm of pleura: Secondary | ICD-10-CM | POA: Diagnosis not present

## 2017-06-13 DIAGNOSIS — Z681 Body mass index (BMI) 19 or less, adult: Secondary | ICD-10-CM

## 2017-06-13 DIAGNOSIS — I81 Portal vein thrombosis: Secondary | ICD-10-CM | POA: Diagnosis present

## 2017-06-13 DIAGNOSIS — Z79891 Long term (current) use of opiate analgesic: Secondary | ICD-10-CM

## 2017-06-13 DIAGNOSIS — G893 Neoplasm related pain (acute) (chronic): Secondary | ICD-10-CM | POA: Diagnosis not present

## 2017-06-13 LAB — COMPREHENSIVE METABOLIC PANEL
ALBUMIN: 2.5 g/dL — AB (ref 3.5–5.0)
ALK PHOS: 341 U/L — AB (ref 38–126)
ALK PHOS: 320 U/L — AB (ref 38–126)
ALT: 23 U/L (ref 14–54)
ALT: 31 U/L (ref 14–54)
ANION GAP: 11 (ref 5–15)
AST: 74 U/L — AB (ref 15–41)
AST: 118 U/L — ABNORMAL HIGH (ref 15–41)
Albumin: 2.7 g/dL — ABNORMAL LOW (ref 3.5–5.0)
Anion gap: 11 (ref 5–15)
BILIRUBIN TOTAL: 0.9 mg/dL (ref 0.3–1.2)
BUN: 15 mg/dL (ref 6–20)
BUN: 12 mg/dL (ref 6–20)
CALCIUM: 9.2 mg/dL (ref 8.9–10.3)
CALCIUM: 8.5 mg/dL — AB (ref 8.9–10.3)
CO2: 26 mmol/L (ref 22–32)
CO2: 22 mmol/L (ref 22–32)
CREATININE: 0.92 mg/dL (ref 0.44–1.00)
CREATININE: 0.64 mg/dL (ref 0.44–1.00)
Chloride: 94 mmol/L — ABNORMAL LOW (ref 101–111)
Chloride: 98 mmol/L — ABNORMAL LOW (ref 101–111)
GFR calc Af Amer: >60 mL/min (ref >60)
GFR calc non Af Amer: >60 mL/min (ref >60)
GFR calc non Af Amer: >60 mL/min (ref >60)
GLUCOSE: 96 mg/dL (ref 65–99)
Glucose, Bld: 136 mg/dL — ABNORMAL HIGH (ref 65–99)
Potassium: 4.1 mmol/L (ref 3.5–5.1)
Potassium: 4 mmol/L (ref 3.5–5.1)
SODIUM: 131 mmol/L — AB (ref 135–145)
Sodium: 131 mmol/L — ABNORMAL LOW (ref 135–145)
Total Bilirubin: 0.8 mg/dL (ref 0.3–1.2)
Total Protein: 8 g/dL (ref 6.5–8.1)
Total Protein: 7.2 g/dL (ref 6.5–8.1)

## 2017-06-13 LAB — LACTIC ACID, PLASMA
LACTIC ACID, VENOUS: 1.7 mmol/L (ref 0.5–1.9)
LACTIC ACID, VENOUS: 4.8 mmol/L — AB (ref 0.5–1.9)
Lactic Acid, Venous: 3.7 mmol/L (ref 0.5–1.9)
Lactic Acid, Venous: 3.7 mmol/L (ref 0.5–1.9)

## 2017-06-13 LAB — URINALYSIS, COMPLETE (UACMP) WITH MICROSCOPIC
BACTERIA UA: NONE SEEN
Bilirubin Urine: NEGATIVE
Glucose, UA: NEGATIVE mg/dL
Hgb urine dipstick: NEGATIVE
Ketones, ur: NEGATIVE mg/dL
Leukocytes, UA: NEGATIVE
Nitrite: NEGATIVE
PROTEIN: NEGATIVE mg/dL
Specific Gravity, Urine: 1.035 — ABNORMAL HIGH (ref 1.005–1.030)
pH: 6 (ref 5.0–8.0)

## 2017-06-13 LAB — CBC WITH DIFFERENTIAL/PLATELET
BASOS ABS: 0.1 10*3/uL (ref 0–0.1)
Basophils Absolute: 0 10*3/uL (ref 0–0.1)
Basophils Relative: 2 %
Basophils Relative: 0 %
EOS PCT: 3 %
Eosinophils Absolute: 0.2 10*3/uL (ref 0–0.7)
Eosinophils Absolute: 0 10*3/uL (ref 0–0.7)
Eosinophils Relative: 1 %
HEMATOCRIT: 33.4 % — AB (ref 35.0–47.0)
HEMATOCRIT: 35.5 % (ref 35.0–47.0)
HEMOGLOBIN: 11.5 g/dL — AB (ref 12.0–16.0)
Hemoglobin: 11.1 g/dL — ABNORMAL LOW (ref 12.0–16.0)
LYMPHS ABS: 0.3 10*3/uL — AB (ref 1.0–3.6)
LYMPHS ABS: 0.3 10*3/uL — AB (ref 1.0–3.6)
LYMPHS PCT: 4 %
Lymphocytes Relative: 6 %
MCH: 29.1 pg (ref 26.0–34.0)
MCH: 28.2 pg (ref 26.0–34.0)
MCHC: 33.2 g/dL (ref 32.0–36.0)
MCHC: 32.3 g/dL (ref 32.0–36.0)
MCV: 87.9 fL (ref 80.0–100.0)
MCV: 87.1 fL (ref 80.0–100.0)
MONO ABS: 0.1 10*3/uL — AB (ref 0.2–0.9)
MONOS PCT: 2 %
MONOS PCT: 1 %
Monocytes Absolute: 0 10*3/uL — ABNORMAL LOW (ref 0.2–0.9)
NEUTROS ABS: 5.1 10*3/uL (ref 1.4–6.5)
NEUTROS PCT: 92 %
Neutro Abs: 5.5 10*3/uL (ref 1.4–6.5)
Neutrophils Relative %: 89 %
PLATELETS: 250 10*3/uL (ref 150–440)
Platelets: 213 10*3/uL (ref 150–440)
RBC: 3.8 MIL/uL (ref 3.80–5.20)
RBC: 4.08 MIL/uL (ref 3.80–5.20)
RDW: 18.7 % — ABNORMAL HIGH (ref 11.5–14.5)
RDW: 18.8 % — ABNORMAL HIGH (ref 11.5–14.5)
WBC: 6.2 10*3/uL (ref 3.6–11.0)
WBC: 5.5 10*3/uL (ref 3.6–11.0)

## 2017-06-13 LAB — APTT: aPTT: 40 seconds — ABNORMAL HIGH (ref 24–36)

## 2017-06-13 LAB — PROCALCITONIN: Procalcitonin: 2.48 ng/mL

## 2017-06-13 LAB — PROTIME-INR
INR: 1.65
INR: 2.12
PROTHROMBIN TIME: 24.1 s — AB (ref 11.4–15.2)
Prothrombin Time: 19.7 seconds — ABNORMAL HIGH (ref 11.4–15.2)

## 2017-06-13 LAB — VANCOMYCIN, TROUGH: Vancomycin Tr: 11 ug/mL — ABNORMAL LOW (ref 15–20)

## 2017-06-13 MED ORDER — MAGNESIUM HYDROXIDE 400 MG/5ML PO SUSP: 15.0000 mL | Freq: Every day | ORAL | Status: DC | PRN

## 2017-06-13 MED ORDER — LIDOCAINE-PRILOCAINE 2.5-2.5 % EX CREA
1.0000 "application " | TOPICAL_CREAM | Freq: Every day | CUTANEOUS | Status: DC | PRN
  Filled 2017-06-13: qty 5

## 2017-06-13 MED ORDER — SODIUM CHLORIDE 0.9 % IV BOLUS (SEPSIS)
1000.0000 mL | Freq: Once | INTRAVENOUS | Status: AC
  Administered 2017-06-13: 1000 mL via INTRAVENOUS

## 2017-06-13 MED ORDER — GABAPENTIN 300 MG PO CAPS
300.0000 mg | ORAL_CAPSULE | Freq: Three times a day (TID) | ORAL | Status: DC
  Administered 2017-06-13 – 2017-06-16 (×6): 300 mg via ORAL
  Filled 2017-06-13 (×6): qty 1

## 2017-06-13 MED ORDER — VANCOMYCIN HCL IN DEXTROSE 750-5 MG/150ML-% IV SOLN
750.0000 mg | INTRAVENOUS | Status: DC
  Administered 2017-06-14 – 2017-06-15 (×3): 750 mg via INTRAVENOUS
  Filled 2017-06-13 (×5): qty 150

## 2017-06-13 MED ORDER — MEGESTROL ACETATE 400 MG/10ML PO SUSP
200.0000 mg | Freq: Every day | ORAL | Status: DC
  Administered 2017-06-14 – 2017-06-16 (×2): 200 mg via ORAL
  Filled 2017-06-13 (×5): qty 5

## 2017-06-13 MED ORDER — SODIUM CHLORIDE 0.9 % IV BOLUS (SEPSIS): 1000.0000 mL | Freq: Once | INTRAVENOUS | Status: DC

## 2017-06-13 MED ORDER — ACETAMINOPHEN 325 MG PO TABS
650.0000 mg | ORAL_TABLET | Freq: Once | ORAL | Status: AC
  Administered 2017-06-13: 650 mg via ORAL

## 2017-06-13 MED ORDER — OXYCODONE HCL 5 MG PO TABS
5.0000 mg | ORAL_TABLET | ORAL | Status: DC | PRN
  Administered 2017-06-14: 5 mg via ORAL
  Administered 2017-06-14: 10 mg via ORAL
  Filled 2017-06-13: qty 1
  Filled 2017-06-13: qty 2

## 2017-06-13 MED ORDER — RIVAROXABAN 20 MG PO TABS
20.0000 mg | ORAL_TABLET | Freq: Every day | ORAL | Status: DC
  Administered 2017-06-13 – 2017-06-16 (×3): 20 mg via ORAL
  Filled 2017-06-13 (×3): qty 1

## 2017-06-13 MED ORDER — OXYCODONE HCL ER 20 MG PO T12A
20.0000 mg | EXTENDED_RELEASE_TABLET | Freq: Two times a day (BID) | ORAL | Status: DC
  Administered 2017-06-13 – 2017-06-16 (×4): 20 mg via ORAL
  Filled 2017-06-13 (×4): qty 1

## 2017-06-13 MED ORDER — SODIUM CHLORIDE 0.9 % IV BOLUS (SEPSIS)
500.0000 mL | Freq: Once | INTRAVENOUS | Status: AC
  Administered 2017-06-13: 500 mL via INTRAVENOUS

## 2017-06-13 MED ORDER — SENNOSIDES-DOCUSATE SODIUM 8.6-50 MG PO TABS
3.0000 | ORAL_TABLET | Freq: Every day | ORAL | Status: DC
  Administered 2017-06-13 – 2017-06-14 (×2): 3 via ORAL
  Filled 2017-06-13 (×2): qty 3

## 2017-06-13 MED ORDER — SODIUM CHLORIDE 0.9 % IV SOLN
INTRAVENOUS | Status: AC
  Administered 2017-06-14: 01:00:00 via INTRAVENOUS

## 2017-06-13 MED ORDER — PIPERACILLIN-TAZOBACTAM 3.375 G IVPB 30 MIN
3.3750 g | Freq: Once | INTRAVENOUS | Status: AC
  Administered 2017-06-13: 3.375 g via INTRAVENOUS
  Filled 2017-06-13: qty 50

## 2017-06-13 MED ORDER — PROMETHAZINE HCL 25 MG PO TABS
12.5000 mg | ORAL_TABLET | Freq: Four times a day (QID) | ORAL | Status: DC | PRN
  Filled 2017-06-13: qty 1

## 2017-06-13 MED ORDER — MAGNESIUM CITRATE PO SOLN
1.0000 | Freq: Once | ORAL | Status: AC
  Administered 2017-06-13: 1 via ORAL
  Filled 2017-06-13: qty 296

## 2017-06-13 MED ORDER — POTASSIUM CHLORIDE CRYS ER 10 MEQ PO TBCR
10.0000 meq | EXTENDED_RELEASE_TABLET | Freq: Every day | ORAL | Status: DC
  Administered 2017-06-14 – 2017-06-16 (×2): 10 meq via ORAL
  Filled 2017-06-13 (×3): qty 1

## 2017-06-13 MED ORDER — DOCUSATE SODIUM 100 MG PO CAPS: 200.0000 mg | ORAL_CAPSULE | Freq: Every day | ORAL | Status: DC | PRN

## 2017-06-13 MED ORDER — PIPERACILLIN-TAZOBACTAM 3.375 G IVPB
3.3750 g | Freq: Three times a day (TID) | INTRAVENOUS | Status: DC
  Administered 2017-06-13 – 2017-06-17 (×11): 3.375 g via INTRAVENOUS
  Filled 2017-06-13 (×11): qty 50

## 2017-06-13 MED ORDER — PROCHLORPERAZINE MALEATE 10 MG PO TABS
10.0000 mg | ORAL_TABLET | Freq: Four times a day (QID) | ORAL | Status: DC | PRN
  Filled 2017-06-13: qty 1

## 2017-06-13 MED ORDER — ACETAMINOPHEN 325 MG PO TABS
650.0000 mg | ORAL_TABLET | Freq: Four times a day (QID) | ORAL | Status: DC | PRN
  Administered 2017-06-14: 650 mg via ORAL
  Filled 2017-06-13: qty 2

## 2017-06-13 MED ORDER — FERROUS SULFATE 325 (65 FE) MG PO TABS
325.0000 mg | ORAL_TABLET | Freq: Two times a day (BID) | ORAL | Status: DC
  Administered 2017-06-14 – 2017-06-16 (×4): 325 mg via ORAL
  Filled 2017-06-13 (×5): qty 1

## 2017-06-13 MED ORDER — IOPAMIDOL (ISOVUE-370) INJECTION 76%
75.0000 mL | Freq: Once | INTRAVENOUS | Status: AC | PRN
  Administered 2017-06-13: 75 mL via INTRAVENOUS

## 2017-06-13 MED ORDER — ONDANSETRON HCL 4 MG/2ML IJ SOLN
4.0000 mg | Freq: Four times a day (QID) | INTRAMUSCULAR | Status: DC | PRN
  Administered 2017-06-15: 4 mg via INTRAVENOUS
  Filled 2017-06-13: qty 2

## 2017-06-13 MED ORDER — VANCOMYCIN HCL IN DEXTROSE 1-5 GM/200ML-% IV SOLN
1000.0000 mg | Freq: Once | INTRAVENOUS | Status: AC
  Administered 2017-06-13: 1000 mg via INTRAVENOUS
  Filled 2017-06-13: qty 200

## 2017-06-13 MED ORDER — ACETAMINOPHEN 325 MG PO TABS
ORAL_TABLET | ORAL | Status: AC
  Administered 2017-06-13: 650 mg via ORAL
  Filled 2017-06-13: qty 2

## 2017-06-13 NOTE — ED Notes (Signed)
Patient transported to CT 

## 2017-06-13 NOTE — H&P (Signed)
[facility] [place] NAME: Cassidy Bradley    MR#:  [id number]  DATE OF BIRTH:  [birth year]  DATE OF ADMISSION:  06/27/2017  PRIMARY CARE PHYSICIAN: Maryland Pink, MD   REQUESTING/REFERRING PHYSICIAN: Delman Kitten, MD  CHIEF COMPLAINT:  Shortness of breath  HISTORY OF PRESENT ILLNESS:  Cassidy Bradley  is a 56 y.o. 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 Patient is seen by Dr. Mike Gip and getting chemotherapy therapy. Chest x-ray and CT angiogram of the chest are negative but patient was febrile and tachycardic. Patient is started on empiric IV antibiotics after cultures and hospitalist team is called to admit the patient  PAST MEDICAL HISTORY:   Past Medical History:  Diagnosis Date  . Breast lump 2006    both breast   . Cancer ([facility])    Uterine Leiomyosarcoma  . Chest wall mass   . DVT (deep venous thrombosis) ([facility])    Left leg  . Uterine cancer ([facility]) 01/2015   chemo    PAST SURGICAL HISTOIRY:   Past Surgical History:  Procedure Laterality Date  . ABDOMINAL HYSTERECTOMY  01/30/15  . ANKLE SURGERY  2005  . BACK SURGERY  2005  . BREAST BIOPSY Bilateral 10 + yrs ago   neg  . CESAREAN SECTION  1993  . CHOLECYSTECTOMY  2001  . COLONOSCOPY      SOCIAL HISTORY:   Social History  Substance Use Topics  . Smoking status: Never Smoker  . Smokeless tobacco: Never Used  . Alcohol use No    FAMILY HISTORY:   Family History  Problem Relation Age of Onset  . Colon polyps Mother   . Lupus Father   . Esophageal cancer Maternal Uncle   . Breast cancer Sister 81       fraternal twin sister  . Breast cancer Maternal Aunt   . Breast cancer Maternal Grandmother 60  . Stomach cancer Maternal Aunt   . Lung cancer Maternal Grandfather   . Cancer - Colon Cousin 57       first cousin    DRUG ALLERGIES:    Allergies  Allergen Reactions  . Pravastatin Hives and Nausea And Vomiting  . Sulfa Antibiotics Hives and Swelling  . Latex Rash  . Other Rash and Other (See Comments)    Pt states that she is allergic to spandex.    REVIEW OF SYSTEMS:  CONSTITUTIONAL: No fever, fatigue or weakness.  EYES: No blurred or double vision.  EARS, NOSE, AND THROAT: No tinnitus or ear pain.  RESPIRATORY: No cough, shortness of breath, wheezing or hemoptysis.  CARDIOVASCULAR: No chest pain, orthopnea, edema.  GASTROINTESTINAL: No nausea, vomiting, diarrhea or abdominal pain.  GENITOURINARY: No dysuria, hematuria.  ENDOCRINE: No polyuria, nocturia,  HEMATOLOGY: No anemia, easy bruising or bleeding SKIN: No rash or lesion. MUSCULOSKELETAL: No joint pain or arthritis.   NEUROLOGIC: No tingling, numbness, weakness.  PSYCHIATRY: No anxiety or depression.   MEDICATIONS AT HOME:   Prior to Admission medications   Medication Sig Start Date End Date Taking? Authorizing Provider  docusate sodium (COLACE) 100 MG capsule Take 200 mg by mouth daily as needed.    Yes [provider]  gabapentin (NEURONTIN) 300 MG capsule Take 300 mg by mouth 3 (three) times daily. 05/06/17  Yes [provider]  lidocaine-prilocaine (EMLA) cream Apply 1 application  topically as needed. Apply one hour prior to chemotherapy 05/01/15  Yes Lequita Asal, MD  magnesium citrate SOLN Take 1 Bottle by mouth once.   Yes [provider]  magnesium hydroxide (MILK OF MAGNESIA) 400 MG/5ML suspension Take 15 mLs by mouth daily as needed for mild constipation.   Yes [provider]  megestrol (MEGACE) 400 MG/10ML suspension Take 5 mLs (200 mg total) by mouth daily. 05/29/17  Yes Corcoran, Drue Second, MD  oxyCODONE (OXY IR/ROXICODONE) 5 MG immediate release tablet Take 1-2 tablets (5-10 mg total) by mouth every 4 (four) hours as needed. 06/11/17  Yes Corcoran, Drue Second, MD  OXYCONTIN 10 MG 12 hr tablet Take 20 mg  by mouth every 12 (twelve) hours.  04/30/17  Yes [provider]  potassium chloride (KLOR-CON 10) 10 MEQ tablet Take 1 tablet (10 mEq total) by mouth daily. Patient taking differently: Take 20 mEq by mouth daily.  01/19/17  Yes Lequita Asal, MD  rivaroxaban (XARELTO) 20 MG TABS tablet Take 1 tablet by mouth  daily with supper 01/16/17  Yes Corcoran, Melissa C, MD  senna-docusate (SENOKOT-S) 8.6-50 MG tablet Take 3 tablets by mouth at bedtime.   Yes [provider]  VOTRIENT 200 MG tablet Take 400 mg by mouth daily.  02/26/17  Yes [provider]  dexamethasone (DECADRON) 4 MG tablet Take 2 tablets by mouth once a day on the day after chemotherapy and then take 2 tablets two times a day for 2 days. Take with food. Patient not taking: Reported on 05/26/2017 11/28/16   Lequita Asal, MD  ferrous sulfate 325 (65 FE) MG tablet Take 1 tablet (325 mg total) by mouth 2 (two) times daily with a meal. Patient not taking: Reported on 05/26/2017 07/23/16   Loletha Grayer, MD  HYDROmorphone (DILAUDID) 2 MG tablet Take 1 tablet (2 mg total) by mouth every 12 (twelve) hours as needed for severe pain. Patient not taking: Reported on 06/05/2017 05/26/17 05/26/18  Carrie Mew, MD  oxyCODONE-acetaminophen (PERCOCET/ROXICET) 5-325 MG tablet Take 1 tablet by mouth every 6 (six) hours as needed for severe pain. Patient not taking: Reported on 05/26/2017 02/26/17   Sindy Guadeloupe, MD  prochlorperazine (COMPAZINE) 10 MG tablet Take 1 tablet (10 mg total) by mouth every 6 (six) hours as needed (Nausea or vomiting). Patient not taking: Reported on 12/25/2016 11/28/16   Lequita Asal, MD  promethazine (PHENERGAN) 12.5 MG tablet Take 1 tablet (12.5 mg total) by mouth every 6 (six) hours as needed for nausea or vomiting. Patient not taking: Reported on 05/26/2017 03/25/17   Gregor Hams, MD      VITAL SIGNS:  Blood pressure 111/87, pulse (!) 129, temperature (!) 102 F (38.9 C),  temperature source Oral, resp. rate (!) 28, weight 48.5 kg (107 lb), SpO2 97 %.  PHYSICAL EXAMINATION:  GENERAL:  56 y.o.-year-old patient lying in the bed with no acute distress.Cachectic EYES: Pupils equal, round, reactive to light and accommodation. No scleral icterus. Extraocular muscles intact.  HEENT: Head atraumatic, normocephalic. Oropharynx and nasopharynx clear.  NECK:  Supple, no jugular venous distention. No thyroid enlargement, no tenderness.  LUNGS: Moderately diminished breath sounds bilaterally, no wheezing, rales,rhonchi or crepitation. No use of accessory muscles of respiration.  CARDIOVASCULAR: S1, S2 normal. No murmurs, rubs, or gallops.  ABDOMEN: Soft, nontender, nondistended. Bowel sounds present.  EXTREMITIES: No pedal edema, cyanosis, or clubbing.  NEUROLOGIC: Cranial nerves II through XII are intact. Muscle strength 5/5 in all  extremities. Sensation intact. Gait not checked.  PSYCHIATRIC: The patient is alert and oriented x 3.  SKIN: No obvious rash, lesion, or ulcer.   LABORATORY PANEL:   CBC  Recent Labs Lab 06/15/2017 1324  WBC 6.2  HGB 11.1*  HCT 33.4*  PLT 250   ------------------------------------------------------------------------------------------------------------------  Chemistries   Recent Labs Lab 06/23/2017 1324  NA 131*  K 4.1  CL 94*  CO2 26  GLUCOSE 136*  BUN 15  CREATININE 0.92  CALCIUM 9.2  AST 74*  ALT 23  ALKPHOS 341*  BILITOT 0.8   ------------------------------------------------------------------------------------------------------------------  Cardiac Enzymes  Recent Labs Lab 06/12/2017 1324  TROPONINI 0.04*   ------------------------------------------------------------------------------------------------------------------  RADIOLOGY:  Ct Angio Chest Pe W Or Wo Contrast  Result Date: 05/31/2017 CLINICAL DATA:  Lung cancer.  Shortness of breath and fever. EXAM: CT ANGIOGRAPHY CHEST WITH CONTRAST TECHNIQUE:  Multidetector CT imaging of the chest was performed using the standard protocol during bolus administration of intravenous contrast. Multiplanar CT image reconstructions and MIPs were obtained to evaluate the vascular anatomy. CONTRAST:  75 cc of Isovue 370 COMPARISON:  05/26/2017 . FINDINGS: Cardiovascular: Satisfactory opacification of the pulmonary arteries to the segmental level. No evidence of pulmonary embolism. Again noted is a filling defect within the left upper lobe pulmonary vein compatible with tumor thrombus extending inferiorly from the patient's large anterior left upper lobe metastasis. Normal heart size. No pericardial effusion. Mediastinum/Nodes: No enlarged mediastinal, hilar, or axillary lymph nodes. Thyroid gland, trachea, and esophagus demonstrate no significant findings. Lungs/Pleura: Small left pleural effusion is new from previous exam. Widespread pulmonary metastasis is identified bilaterally. The dominant mass within the medial left upper lobe measures 11 by 5.5 cm, image 24 of series 4. Previously 8.5 x 5.3 cm. Upper Abdomen: Multifocal liver metastases are again identified. Index lesion within segment 8 measures 2.7 cm, image 72 of series 4. Previously 2.5 cm. The right adrenal gland metastases measure 6.4 cm, image 87 of series 4. Previously 4.7 cm. Confluent nodal mass within the upper abdomen measures approximately 7.8 cm and is now encasing the celiac trunk and its branches. Previously this measured 4.7 cm. Musculoskeletal: No chest wall abnormality. No acute or significant osseous findings. Review of the MIP images confirms the above findings. IMPRESSION: 1. No evidence for acute pulmonary embolus. 2. Extensive pulmonary metastases is again noted. The dominant mass within the left upper lobe has increased in size in the interval. 3. Upper abdominal metastasis including liver, right adrenal gland and upper abdominal nodal metastasis have progressed from previous exam. Nodal mass  within the upper abdomen now encases the celiac artery and its branches. 4. Tumor thrombus within the left upper lobe pulmonary vein is again noted. Electronically Signed   By: Kerby Moors M.D.   On: 06/27/2017 16:05   Dg Chest Port 1 View  Result Date: 06/03/2017 CLINICAL DATA:  Shortness of breath and fever. History of metastatic uterine leiomyosarcoma. EXAM: PORTABLE CHEST 1 VIEW COMPARISON:  Chest CT 05/26/2017 and chest radiograph 05/26/2017 FINDINGS: Again noted is a left subclavian Port-A-Cath. Catheter tip is near the upper right atrium. Again noted is a large density near the aortic arch compatible with the patient's known left upper chest/mediastinal mass. Scattered parenchymal densities throughout both lungs compatible with known metastatic disease. The disease burden in the chest has not significantly changed since the recent comparison examination. Heart size is stable. Moderate amount of gas in the bowel structures in the upper abdomen. IMPRESSION: Stable appearance of the metastatic disease within  the chest. No significant change from the most recent comparison examination. Electronically Signed   By: Markus Daft M.D.   On: 06/18/2017 14:34    EKG:   Orders placed or performed in visit on 06/09/2017  . EKG 12-Lead   Sinus tachycardia, left ventricular hypertrophy, nonspecific ST depressions IMPRESSION AND PLAN:   Brandilynn Taormina  is a 56 y.o. 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 Patient is seen by Dr. Mike Gip and getting chemotherapy therapy  # Sepsis could be from post obstructive pneumonia Admit to MedSurg unit Empiric IV antibiotics Zosyn and vancomycin Pancultures were obtained in the ED IV fluids Check a.m. Labs Meets septic criteria with fever, tachycardia elevated lactic acid CTA chest negative for pulmonary embolism  #Metastatic uterine cancer  with lung metastases Holding chemotherapy for now as patient is acutely ill  #Cachexia Dietary consult for nutrition supplements  #Hyponatremia hydrate with IV fluids     All the records are reviewed and case discussed with ED provider. Management plans discussed with the patient, family and they are in agreement.  CODE STATUS:  FC   TOATAL TIME TAKING CARE OF THIS PATIENT: 45 minutes.   Note: This dictation was prepared with Dragon dictation along with smaller phrase technology. Any transcriptional errors that result from this process are unintentional.  Nicholes Mango M.D on 05/30/2017 at 5:03 PM  Between 7am to 6pm - Pager - [phone]  After 6pm go to [email or web address] - password [email or web address] [facility]  [facility]  Office  [phone]  CC: Primary care physician; Maryland Pink, MD

## 2017-06-13 NOTE — Progress Notes (Signed)
Pharmacy Antibiotic Note  Cassidy Bradley is a 56 y.o. female admitted on 06/12/2017 with  sepsis secondary to PNA.  Pharmacy has been consulted for Zosyn and vancomcyin dosing. Patient received vancomycin 1g IV and Zosyn 3.375 IV x 1 dose in ED.   Plan: Ke: 0.048   T1/2: 14.5    Vd: 34    Will order vancomcyin 750mg  IV every 18 hours with 12 hour stack dosing. Calculated trough at Css is 18. Trough level ordered prior to 4th dose. Will monitor renal function and adjust dose as needed.  Recommend ordering MRSA PCR- if negative recommend discontinuing vancomycin.  Will order Zosyn 3.375 IV EI every 8 hours.   Weight: 107 lb (48.5 kg)  Temp (24hrs), Avg:100.9 F (38.3 C), Min:98.3 F (36.8 C), Max:102.3 F (39.1 C)   Recent Labs Lab 06/11/17 1510 06/18/2017 1324 06/14/2017 1626  WBC 6.2 6.2  --   CREATININE 0.59 0.92  --   LATICACIDVEN  --  3.7* 1.7    Estimated Creatinine Clearance: 52.9 mL/min (by C-G formula based on SCr of 0.92 mg/dL).    Allergies  Allergen Reactions  . Pravastatin Hives and Nausea And Vomiting  . Sulfa Antibiotics Hives and Swelling  . Latex Rash  . Other Rash and Other (See Comments)    Pt states that she is allergic to spandex.    Antimicrobials this admission: 6/16 vancomycin >> 6/16 Zosyn >>  Dose adjustments this admission:   Microbiology results: 6/16  BCx: sent MRSA PCR:   Thank you for allowing pharmacy to be a part of this patient's care.  Pernell Dupre, PharmD, BCPS Clinical Pharmacist 06/04/2017 6:11 PM

## 2017-06-13 NOTE — ED Notes (Signed)
Dr. Jacqualine Code notified of critical lactic acid

## 2017-06-13 NOTE — ED Notes (Signed)
CODE SEPSIS CALLED TO Cassidy Bradley AT [facility] 

## 2017-06-13 NOTE — Progress Notes (Signed)
Family Meeting Note  Advance Directive:yes  Today a meeting took place with the Patient, spouse and sister    The following clinical team members were present during this meeting:MD  The following were discussed:Patient's diagnosis: , Patient's progosis: Unable to determine and Goals for treatment: Full Code. Husband is HCPOA  Additional follow-up to be provided: md   Time spent during discussion:16 min  Corlis Angelica, Illene Silver, MD

## 2017-06-13 NOTE — ED Notes (Signed)
Pt returned from CT °

## 2017-06-13 NOTE — ED Notes (Signed)
Pt resting in bed, resp even and unlabored, pt in no acute distress 

## 2017-06-13 NOTE — ED Triage Notes (Signed)
Pt brought to ED by family members for shortness of breath and fever. Pt is cancer pt, taking oral chemo. Pt has had fever, shortness of breath, and increased heart rate. Pt called her MD who recommended she come to ED for evaluation. Pt appears pale and lethargic in triage. Pt taken to ED room 1 for suspected sepsis work up.

## 2017-06-13 NOTE — ED Notes (Signed)
Dr. Jacqualine Code notified of critical trop of 0.04

## 2017-06-13 NOTE — ED Provider Notes (Signed)
[facility] Emergency Department Provider Note   ____________________________________________   First MD Initiated Contact with Patient 06/01/2017 1330     (approximate)  I have reviewed the triage vital signs and the nursing notes.   HISTORY  Chief Complaint Code Sepsis and Shortness of Breath    HPI Cassidy Bradley is a 56 y.o. female here for evaluation of increased shortness of breath with productive cough and increasing weakness starting about 1 day ago  The patient has ongoing care for pancreatic cancer and metastatic diseases. She reports she's had increasing feeling of shortness of breath over the last day and cough, she feels weak and tired and fatigued. She feels very dehydrated. She also knows that she has thrush and is being treated for this  Patient reports feeling short of breath, productive cough, fatigue. Chills and achiness.  She is on oral chemotherapeutics. History of metastatic cancer. Weight loss.  CTs oncology at [facility]   Past Medical History:  Diagnosis Date  . Breast lump 2006    both breast   . 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 Chest wall mass   . DVT (deep venous thrombosis) (HCC)    Left leg  . Uterine cancer ([place]) 01/2015   chemo    Patient Active Problem List   Diagnosis Date Noted  . Cancer associated pain 06/11/2017  . Counseling regarding goals of care 06/11/2017  . 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 Acute pain of right shoulder 01/12/2017  . Encounter for antineoplastic chemotherapy 01/12/2017  . Hypokalemia 12/07/2016  . Liver metastasis ([place]) 11/28/2016  . Rhabdomyolysis 07/21/2016  . DVT (deep venous thrombosis) ([place])   . Reflux 08/11/2015  . Drug reaction 05/26/2015  . Acute febrile illness 05/25/2015  . Elevated LFTs 05/25/2015  . Anemia 05/25/2015  . Sepsis ([place]) 05/25/2015  . Anemia of chronic disease 05/25/2015  .  Candidiasis of mouth 05/25/2015  . 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 Pulmonary metastasis ([place]) 02/12/2015  . Encounter for screening colonoscopy 08/25/2014  . Allergic rhinitis 08/23/2014  . Hypercholesterolemia 08/23/2014  . Climacteric 08/23/2014  . Headache, migraine 08/23/2014  . Lump or mass in breast 06/23/2013  . Essential hypertension, benign 03/30/2013  . Benign essential HTN 03/30/2013  . Benign breast cyst in female 03/29/2013  . Cyst (solitary) of breast 03/29/2013    Past Surgical History:  Procedure Laterality Date  . ABDOMINAL HYSTERECTOMY  01/30/15  . ANKLE SURGERY  2005  . BACK SURGERY  2005  . BREAST BIOPSY Bilateral 10 + yrs ago   neg  . CESAREAN SECTION  1993  . CHOLECYSTECTOMY  2001  . COLONOSCOPY      Prior to Admission medications   Medication Sig Start Date End Date Taking? Authorizing Provider  docusate sodium (COLACE) 100 MG capsule Take 200 mg by mouth daily as needed.    Yes [provider]  gabapentin (NEURONTIN) 300 MG capsule Take 300 mg by mouth 3 (three) times daily. 05/06/17  Yes [provider]  lidocaine-prilocaine (EMLA) cream Apply 1 application topically as needed. Apply one hour prior to chemotherapy 05/01/15  Yes Lequita Asal, MD  magnesium citrate SOLN Take 1 Bottle by mouth once.   Yes [provider]  magnesium hydroxide (MILK OF MAGNESIA) 400 MG/5ML suspension Take 15 mLs by mouth daily as needed for mild constipation.   Yes [provider]  megestrol (MEGACE) 400 MG/10ML suspension Take  5 mLs (200 mg total) by mouth daily. 05/29/17  Yes Corcoran, Drue Second, MD  oxyCODONE (OXY IR/ROXICODONE) 5 MG immediate release tablet Take 1-2 tablets (5-10 mg total) by mouth every 4 (four) hours as needed. 06/11/17  Yes Corcoran, Drue Second, MD  OXYCONTIN 10 MG 12 hr tablet Take 20 mg by mouth every 12 (twelve) hours.  04/30/17  Yes [provider]  potassium chloride (KLOR-CON 10) 10 MEQ tablet  Take 1 tablet (10 mEq total) by mouth daily. Patient taking differently: Take 20 mEq by mouth daily.  01/19/17  Yes Lequita Asal, MD  rivaroxaban (XARELTO) 20 MG TABS tablet Take 1 tablet by mouth  daily with supper 01/16/17  Yes Corcoran, Melissa C, MD  senna-docusate (SENOKOT-S) 8.6-50 MG tablet Take 3 tablets by mouth at bedtime.   Yes [provider]  VOTRIENT 200 MG tablet Take 400 mg by mouth daily.  02/26/17  Yes [provider]  dexamethasone (DECADRON) 4 MG tablet Take 2 tablets by mouth once a day on the day after chemotherapy and then take 2 tablets two times a day for 2 days. Take with food. Patient not taking: Reported on 05/26/2017 11/28/16   Lequita Asal, MD  ferrous sulfate 325 (65 FE) MG tablet Take 1 tablet (325 mg total) by mouth 2 (two) times daily with a meal. Patient not taking: Reported on 05/26/2017 07/23/16   Loletha Grayer, MD  HYDROmorphone (DILAUDID) 2 MG tablet Take 1 tablet (2 mg total) by mouth every 12 (twelve) hours as needed for severe pain. Patient not taking: Reported on 06/03/2017 05/26/17 05/26/18  Carrie Mew, MD  oxyCODONE-acetaminophen (PERCOCET/ROXICET) 5-325 MG tablet Take 1 tablet by mouth every 6 (six) hours as needed for severe pain. Patient not taking: Reported on 05/26/2017 02/26/17   Sindy Guadeloupe, MD  prochlorperazine (COMPAZINE) 10 MG tablet Take 1 tablet (10 mg total) by mouth every 6 (six) hours as needed (Nausea or vomiting). Patient not taking: Reported on 12/25/2016 11/28/16   Lequita Asal, MD  promethazine (PHENERGAN) 12.5 MG tablet Take 1 tablet (12.5 mg total) by mouth every 6 (six) hours as needed for nausea or vomiting. Patient not taking: Reported on 05/26/2017 03/25/17   Gregor Hams, MD    Allergies Pravastatin; Sulfa antibiotics; Latex; and Other  Family History  Problem Relation Age of Onset  . Colon polyps Mother   . Lupus Father   . Esophageal cancer Maternal Uncle   . Breast cancer  Sister 53       fraternal twin sister  . Breast cancer Maternal Aunt   . Breast cancer Maternal Grandmother 60  . Stomach cancer Maternal Aunt   . Lung cancer Maternal Grandfather   . Cancer - Colon Cousin 46       first cousin    Social History Social History  Substance Use Topics  . Smoking status: Never Smoker  . Smokeless tobacco: Never Used  . Alcohol use No    Review of Systems Constitutional: Fevers chills and weakness Eyes: No visual changes. ENT: No sore throat. Cardiovascular: Denies chest pain. Respiratory: See history of present illness. Gastrointestinal: No abdominal pain.  No nausea, no vomiting.  No diarrhea.  No constipation. Genitourinary: Negative for dysuria. Musculoskeletal: Negative for back pain. Skin: Negative for rash. Neurological: Negative for headaches, focal weakness or numbness.    ____________________________________________   PHYSICAL EXAM:  VITAL SIGNS: ED Triage Vitals  Enc Vitals Group     BP 06/04/2017 1330  103/84     Pulse Rate 06/16/2017 1322 (!) 154     Resp 06/18/2017 1330 (!) 31     Temp 06/23/2017 1322 (!) 102.3 F (39.1 C)     Temp Source 06/26/2017 1322 Oral     SpO2 06/10/2017 1322 100 %     Weight 06/22/2017 1323 107 lb (48.5 kg)     Height --      Head Circumference --      Peak Flow --      Pain Score 06/25/2017 1322 0     Pain Loc --      Pain Edu? --      Excl. in [place]? --     Constitutional: Alert and oriented. Rather dyspneic, appears moderately ill and fatigued. Eyes: Conjunctivae are normal. Head: Atraumatic. Nose: No congestion/rhinnorhea. Mouth/Throat: Mucous membranes are moist. Neck: No stridor.   Cardiovascular: Tachycardic rate, regular rhythm. Grossly normal heart sounds.  Good peripheral circulation. Respiratory: Mild tachypnea. Diminished lung sounds throughout without wheezing or rales.  No retractions. Lungs CTAB. Gastrointestinal: Soft and nontender. No distention. Musculoskeletal: No lower extremity  tenderness nor edema. Neurologic:  Normal speech and language. No gross focal neurologic deficits are appreciated.  Skin:  Skin is warm, dry and intact. No rash noted. Psychiatric: Mood and affect are normal. Speech and behavior are normal.  ____________________________________________   LABS (all labs ordered are listed, but only abnormal results are displayed)  Labs Reviewed  COMPREHENSIVE METABOLIC PANEL - Abnormal; Notable for the following:       Result Value   Sodium 131 (*)    Chloride 94 (*)    Glucose, Bld 136 (*)    Albumin 2.7 (*)    AST 74 (*)    Alkaline Phosphatase 341 (*)    All other components within normal limits  LACTIC ACID, PLASMA - Abnormal; Notable for the following:    Lactic Acid, Venous 3.7 (*)    All other components within normal limits  CBC WITH DIFFERENTIAL/PLATELET - Abnormal; Notable for the following:    Hemoglobin 11.1 (*)    HCT 33.4 (*)    RDW 18.7 (*)    Lymphs Abs 0.3 (*)    Monocytes Absolute 0.1 (*)    All other components within normal limits  PROTIME-INR - Abnormal; Notable for the following:    Prothrombin Time 19.7 (*)    All other components within normal limits  TROPONIN I - Abnormal; Notable for the following:    Troponin I 0.04 (*)    All other components within normal limits  CULTURE, BLOOD (ROUTINE X 2)  CULTURE, BLOOD (ROUTINE X 2)  LACTIC ACID, PLASMA  URINALYSIS, COMPLETE (UACMP) WITH MICROSCOPIC   ____________________________________________  EKG  Reviewed and interpreted by me at 1330 Heart rate 145 Curiously QTc 410 Sinus tachycardia, T-wave abnormalities which may represent diffuse endocardial ischemia versus LVH. No focal ST elevation. ____________________________________________  RADIOLOGY  Dg Chest Port 1 View  Result Date: 06/09/2017 CLINICAL DATA:  Shortness of breath and fever. History of metastatic uterine leiomyosarcoma. EXAM: PORTABLE CHEST 1 VIEW COMPARISON:  Chest CT 05/26/2017 and chest  radiograph 05/26/2017 FINDINGS: Again noted is a left subclavian Port-A-Cath. Catheter tip is near the upper right atrium. Again noted is a large density near the aortic arch compatible with the patient's known left upper chest/mediastinal mass. Scattered parenchymal densities throughout both lungs compatible with known metastatic disease. The disease burden in the chest has not significantly changed since the recent comparison examination. Heart  size is stable. Moderate amount of gas in the bowel structures in the upper abdomen. IMPRESSION: Stable appearance of the metastatic disease within the chest. No significant change from the most recent comparison examination. Electronically Signed   By: Markus Daft M.D.   On: 06/03/2017 14:34    ____________________________________________   PROCEDURES  Procedure(s) performed: None  Procedures  Critical Care performed: Yes, see critical care note(s)  CRITICAL CARE Performed by: Delman Kitten   Total critical care time: 40 minutes  Critical care time was exclusive of separately billable procedures and treating other patients.  Critical care was necessary to treat or prevent imminent or life-threatening deterioration.  Critical care was time spent personally by me on the following activities: development of treatment plan with patient and/or surrogate as well as nursing, discussions with consultants, evaluation of patient's response to treatment, examination of patient, obtaining history from patient or surrogate, ordering and performing treatments and interventions, ordering and review of laboratory studies, ordering and review of radiographic studies, pulse oximetry and re-evaluation of patient's condition.  ____________________________________________   INITIAL IMPRESSION / ASSESSMENT AND PLAN / ED COURSE  Pertinent labs & imaging results that were available during my care of the patient were reviewed by me and considered in my medical decision  making (see chart for details).    Patient presents for increasing shortness of breath and nonproductive cough. She is febrile, notably very tachycardic, appears acutely ill without hypotension yet. I'm most suspect likely some type of a pulmonary infection based on her symptomatology, however fever can be triggered by other multiple etiologies. She had a recent CT scan and did not have evidence of pulmonary embolism, but did have concerns for increasing metastatic burden. We'll treat her with broad-spectrum antibiotics as we await labs given the very high concern for severe sepsis. Possible end organ dysfunction, continuing to exclude this at this time.     ED Sepsis - Repeat Assessment   Performed at:    06/16/2017 at 3:30 PM  Last Vitals:    Blood pressure 111/87, pulse (!) 129, temperature (!) 102 F (38.9 C), temperature source Oral, resp. rate (!) 28, weight 48.5 kg (107 lb), SpO2 97 %.  Heart:      Tachycardic, clear sounds  Lungs:     Diminished throughout, mild ongoing tachypnea, no wheezes or rales  Capillary Refill:   One second  Peripheral Pulse (include location): Right radial   Skin (include color):   Warm, well-perfused and pink   ----------------------------------------- 3:27 PM on 06/12/2017 -----------------------------------------Likely suspect sepsis, probably from a pulmonary source. Patient's detailed workup is still ongoing, but it is clear that she requires admission, broad-spectrum antibiotics, and I have discussed with the hospitalist service who will load with the patient. The patient recently underwent CT scan to exclude pulmonary embolism just 2 weeks ago, and this was negative. She denies any pleuritic chest pain, she gives a history of primarily infectious symptoms with a notable fever to 102. At this point treating with antibiotics, fluids which is she is responding to well. Do not believe she is currently suffering from an acute pulmonary embolism, but  given the presentation I will order a CT scan of the chest to exclude pulmonary embolism which the hospitalist service will be able to follow-up on.   ____________________________________________   FINAL CLINICAL IMPRESSION(S) / ED DIAGNOSES  Final diagnoses:  Healthcare-associated pneumonia  Sepsis, due to unspecified organism ([place])  Dyspnea      NEW MEDICATIONS STARTED DURING THIS VISIT:  New Prescriptions   No medications on file     Note:  This document was prepared using Dragon voice recognition software and may include unintentional dictation errors.     Delman Kitten, MD 06/03/2017 [phone]

## 2017-06-13 NOTE — ED Notes (Signed)
EDP at bedside  

## 2017-06-13 NOTE — Progress Notes (Signed)
Lactic acid reported at 3.7, MD Jannifer Franklin made aware. Repeat Lactic acid 4.8. NS 1000 ml bolus ordered. NS at 100/h ordered. Pt put on continuous pulse ox. Repeat lactic ordered for 0100. Will continue to monitor.

## 2017-06-14 LAB — LACTIC ACID, PLASMA: Lactic Acid, Venous: 1.1 mmol/L (ref 0.5–1.9)

## 2017-06-14 MED ORDER — ENSURE ENLIVE PO LIQD
237.0000 mL | Freq: Three times a day (TID) | ORAL | Status: DC
  Administered 2017-06-14 – 2017-06-15 (×3): 237 mL via ORAL

## 2017-06-14 MED ORDER — HYDROCOD POLST-CPM POLST ER 10-8 MG/5ML PO SUER
5.0000 mL | Freq: Two times a day (BID) | ORAL | Status: DC
  Administered 2017-06-14 – 2017-06-16 (×3): 5 mL via ORAL
  Filled 2017-06-14 (×3): qty 5

## 2017-06-14 NOTE — Progress Notes (Signed)
[facility] [place] NAME: Cassidy Bradley    MR#:  [id number]  DATE OF BIRTH:  1961-02-01  SUBJECTIVE:  CHIEF COMPLAINT:   Chief Complaint  Patient presents with  . Code Sepsis  . Shortness of Breath    REVIEW OF SYSTEMS:  Review of Systems  Constitutional: Positive for malaise/fatigue and weight loss. Negative for chills and fever.  HENT: Negative for ear discharge, hearing loss and nosebleeds.   Respiratory: Positive for sputum production and shortness of breath. Negative for cough and wheezing.   Cardiovascular: Negative for chest pain, palpitations and leg swelling.  Gastrointestinal: Positive for nausea. Negative for abdominal pain, constipation, diarrhea and vomiting.  Genitourinary: Negative for dysuria.  Musculoskeletal: Negative for myalgias.  Neurological: Negative for dizziness, speech change, focal weakness, seizures and headaches.  Psychiatric/Behavioral: Negative for depression.    DRUG ALLERGIES:   Allergies  Allergen Reactions  . Pravastatin Hives and Nausea And Vomiting  . Sulfa Antibiotics Hives and Swelling  . Latex Rash  . Other Rash and Other (See Comments)    Pt states that she is allergic to spandex.    VITALS:  Blood pressure 95/77, pulse (!) 120, temperature 99.5 F (37.5 C), temperature source Oral, resp. rate 20, height 5\' 4"  (1.626 m), weight 48.5 kg (107 lb), SpO2 97 %.  PHYSICAL EXAMINATION:  Physical Exam  GENERAL:  56 y.o.-year-old ill nourished patient lying in the bed with no acute distress.  EYES: Pupils equal, round, reactive to light and accommodation. No scleral icterus. Extraocular muscles intact.  HEENT: Head atraumatic, normocephalic. Oropharynx and nasopharynx clear.  NECK:  Supple, no jugular venous distention. No thyroid enlargement, no tenderness.  LUNGS: Normal breath sounds bilaterally,Scattered rhonchi in the upper lobes, no wheezing, rales or crepitation. No use of  accessory muscles of respiration.  CARDIOVASCULAR: S1, S2 normal. No  rubs, or gallops. 2/6 systolic murmur is present ABDOMEN: Soft, nontender, nondistended. Bowel sounds present. No organomegaly or mass.  EXTREMITIES: No pedal edema, cyanosis, or clubbing.  NEUROLOGIC: Cranial nerves II through XII are intact. Muscle strength 5/5 in all extremities. Sensation intact. Gait not checked.  PSYCHIATRIC: The patient is alert and oriented x 3.  SKIN: No obvious rash, lesion, or ulcer.    LABORATORY PANEL:   CBC  Recent Labs Lab 06/02/2017 2036  WBC 5.5  HGB 11.5*  HCT 35.5  PLT 213   ------------------------------------------------------------------------------------------------------------------  Chemistries   Recent Labs Lab 06/08/2017 2036  NA 131*  K 4.0  CL 98*  CO2 22  GLUCOSE 96  BUN 12  CREATININE 0.64  CALCIUM 8.5*  AST 118*  ALT 31  ALKPHOS 320*  BILITOT 0.9   ------------------------------------------------------------------------------------------------------------------  Cardiac Enzymes  Recent Labs Lab 05/29/2017 1324  TROPONINI 0.04*   ------------------------------------------------------------------------------------------------------------------  RADIOLOGY:  Ct Angio Chest Pe W Or Wo Contrast  Result Date: 06/22/2017 CLINICAL DATA:  Lung cancer.  Shortness of breath and fever. EXAM: CT ANGIOGRAPHY CHEST WITH CONTRAST TECHNIQUE: Multidetector CT imaging of the chest was performed using the standard protocol during bolus administration of intravenous contrast. Multiplanar CT image reconstructions and MIPs were obtained to evaluate the vascular anatomy. CONTRAST:  75 cc of Isovue 370 COMPARISON:  05/26/2017 . FINDINGS: Cardiovascular: Satisfactory opacification of the pulmonary arteries to the segmental level. No evidence of pulmonary embolism. Again noted is a filling defect within the left upper lobe pulmonary vein compatible with tumor thrombus extending  inferiorly from the patient's large anterior left upper  lobe metastasis. Normal heart size. No pericardial effusion. Mediastinum/Nodes: No enlarged mediastinal, hilar, or axillary lymph nodes. Thyroid gland, trachea, and esophagus demonstrate no significant findings. Lungs/Pleura: Small left pleural effusion is new from previous exam. Widespread pulmonary metastasis is identified bilaterally. The dominant mass within the medial left upper lobe measures 11 by 5.5 cm, image 24 of series 4. Previously 8.5 x 5.3 cm. Upper Abdomen: Multifocal liver metastases are again identified. Index lesion within segment 8 measures 2.7 cm, image 72 of series 4. Previously 2.5 cm. The right adrenal gland metastases measure 6.4 cm, image 87 of series 4. Previously 4.7 cm. Confluent nodal mass within the upper abdomen measures approximately 7.8 cm and is now encasing the celiac trunk and its branches. Previously this measured 4.7 cm. Musculoskeletal: No chest wall abnormality. No acute or significant osseous findings. Review of the MIP images confirms the above findings. IMPRESSION: 1. No evidence for acute pulmonary embolus. 2. Extensive pulmonary metastases is again noted. The dominant mass within the left upper lobe has increased in size in the interval. 3. Upper abdominal metastasis including liver, right adrenal gland and upper abdominal nodal metastasis have progressed from previous exam. Nodal mass within the upper abdomen now encases the celiac artery and its branches. 4. Tumor thrombus within the left upper lobe pulmonary vein is again noted. Electronically Signed   By: Kerby Moors M.D.   On: 06/08/2017 16:05   Dg Chest Port 1 View  Result Date: 06/06/2017 CLINICAL DATA:  56 y/o  F; sepsis. EXAM: PORTABLE CHEST 1 VIEW COMPARISON:  06/20/2017 chest CT. FINDINGS: Left suprahilar mass and numerous pulmonary nodules are better characterized on the prior CT of the chest and not significantly changed given differences in  technique. Stable cardiac silhouette. Left port catheter tip projects over the right atrium. No definite new consolidation or effusion. No acute osseous abnormality. Right upper quadrant cholecystectomy clips. IMPRESSION: 1. Left suprahilar mass and numerous pulmonary nodules are better characterized on prior chest CT and not significantly changed given differences in technique. 2. No definite new consolidation or effusion. Electronically Signed   By: Kristine Garbe M.D.   On: 06/18/2017 18:56   Dg Chest Port 1 View  Result Date: 06/22/2017 CLINICAL DATA:  Shortness of breath and fever. History of metastatic uterine leiomyosarcoma. EXAM: PORTABLE CHEST 1 VIEW COMPARISON:  Chest CT 05/26/2017 and chest radiograph 05/26/2017 FINDINGS: Again noted is a left subclavian Port-A-Cath. Catheter tip is near the upper right atrium. Again noted is a large density near the aortic arch compatible with the patient's known left upper chest/mediastinal mass. Scattered parenchymal densities throughout both lungs compatible with known metastatic disease. The disease burden in the chest has not significantly changed since the recent comparison examination. Heart size is stable. Moderate amount of gas in the bowel structures in the upper abdomen. IMPRESSION: Stable appearance of the metastatic disease within the chest. No significant change from the most recent comparison examination. Electronically Signed   By: Markus Daft M.D.   On: 06/22/2017 14:34    EKG:   Orders placed or performed in visit on 06/26/2017  . 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 -Blood cultures are pending. -Continue vancomycin and Zosyn. No other source of infection identified. Lactic acid is normalizing.  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 Oncology consult.  #4 severe malnutrition-dietary consult and supplements. Also on Megace  #5 history of DVT-on Xarelto   All the records are reviewed and case discussed with Care Management/Social Workerr. Management plans discussed with the patient, family and they are in agreement.  CODE STATUS: Full Code  TOTAL TIME TAKING CARE OF THIS PATIENT: 37 minutes.   POSSIBLE D/C IN 1-2 DAYS, DEPENDING ON CLINICAL CONDITION.   Gladstone Lighter M.D on 06/14/2017 at 1:18 PM  Between 7am to 6pm - Pager - [phone]  After 6pm go to [email or web address] - password [email or web address] [facility]  Office  [phone]  CC: Primary care physician; [place] Pink, MD

## 2017-06-14 NOTE — Progress Notes (Signed)
Initial Nutrition Assessment  DOCUMENTATION CODES:   Severe malnutrition in context of chronic illness, Underweight  INTERVENTION:  Provide Ensure Enlive po TID, each supplement provides 350 kcal and 20 grams of protein. Patient prefers chocolate.  Reviewed "Making the Most of Each Bite" handout from the [employer] with patient. Encouraged small, frequent meals with adequate calories and protein. As patient only eating bites, discussed she will likely need to eat every 1-2 hours. Also brought in "Taste and Smell Changes" handout to review, but patient was too tired at time of initial assessment. Requesting RD return another day to review that handout.  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   -Patient was diagnosed in 2016. Underwent TAH/BSO on 01/30/2015. Received 4 cycles of gemcitabine and Taxotere 03/20/2015 to 05/22/2015 with Neulasta support (on 5/24 received gemcitabine alone). Received 8 cycles of adriamycin 06/07/2015 to 12/06/2015 with Neulasta support (Zinecard added with cycle 7, Lartruvo added with cycle 8). Received 3 cycles of Lartruvo 12/27/2015 to 12/07/2016. Received 5 cycles of trabectedin 03/13/2016-07/02/2016. Received 2 cycles of decarbazine 11/28/2016-12/25/2016. Was in [facility] phase II  study at [facility] (pazopanib vs. [id number]) but has been removed from the study now.  Spoke with patient at bedside. She reports her appetite has been very poor for a while now. Reports she knows she needs to eat better she just can't. She does better with liquids than food. Reports she drinks water, Ginger-ale, juices. Can tolerate bites of food, such as yogurt. Drinks Chocolate Boost Plus at home. Patient reports she has early satiety, taste changes, trouble breathing, and difficulty chewing related to thrush. She is amenable to trying chocolate Ensure here, but reports she does not tolerate vanilla or strawberry Ensure.   UBW was >160 lbs. Per chart patient was 166.4 lbs on 06/05/2016. She has lost 59.4 lbs (35.7% body weight) over the past year, which is significant for time frame. Of that, patient has lost most of the weight (49 lbs/31% body weight) over the past 8 months.  Medications reviewed and include: ferrous sulfate 325 mg BID, Megace 200 mg daily, potassium chloride 10 mEq daily, senna, Zosyn, vancomycin.   Labs reviewed: Sodium 131, Chloride 98, Alkaline Phosphatase 320, AST 118.  Nutrition-Focused physical exam completed. Findings are moderate-severe fat depletion, severe muscle depletion, and no edema.   Discussed with RN.  Diet Order:  Diet regular Room service appropriate? Yes; Fluid consistency: Thin  Skin:  Reviewed, no issues  Last BM:  06/14/2017 - type 5  Height:   Ht Readings from Last 1 Encounters:  06/16/2017 5\' 4"  (1.626 m)    Weight:   Wt Readings from Last 1 Encounters:  06/02/2017 107 lb (48.5 kg)    Ideal Body Weight:  54.5 kg  BMI:  Body mass index is 18.37 kg/m.  Estimated Nutritional Needs:   Kcal:  6433-2951 (  MSJ x 1.4-1.6)  Protein:  75-90 grams (1.5-1.8 grams/kg)  Fluid:  1.5-1.7 L/day (30-35 ml/kg)  EDUCATION NEEDS:   Education needs addressed  Willey Blade, MS, RD, LDN Pager: [phone] After Hours Pager: [phone]

## 2017-06-15 ENCOUNTER — Encounter: Payer: Self-pay | Admitting: Hematology and Oncology

## 2017-06-15 DIAGNOSIS — Z7901 Long term (current) use of anticoagulants: Secondary | ICD-10-CM

## 2017-06-15 DIAGNOSIS — R0602 Shortness of breath: Secondary | ICD-10-CM

## 2017-06-15 DIAGNOSIS — Z79899 Other long term (current) drug therapy: Secondary | ICD-10-CM

## 2017-06-15 DIAGNOSIS — C7802 Secondary malignant neoplasm of left lung: Secondary | ICD-10-CM

## 2017-06-15 DIAGNOSIS — Z86718 Personal history of other venous thrombosis and embolism: Secondary | ICD-10-CM

## 2017-06-15 DIAGNOSIS — R000 Tachycardia, unspecified: Secondary | ICD-10-CM

## 2017-06-15 DIAGNOSIS — C7801 Secondary malignant neoplasm of right lung: Secondary | ICD-10-CM

## 2017-06-15 DIAGNOSIS — Z66 Do not resuscitate: Secondary | ICD-10-CM

## 2017-06-15 DIAGNOSIS — C787 Secondary malignant neoplasm of liver and intrahepatic bile duct: Secondary | ICD-10-CM

## 2017-06-15 DIAGNOSIS — Z9221 Personal history of antineoplastic chemotherapy: Secondary | ICD-10-CM

## 2017-06-15 DIAGNOSIS — A419 Sepsis, unspecified organism: Principal | ICD-10-CM

## 2017-06-15 DIAGNOSIS — C782 Secondary malignant neoplasm of pleura: Secondary | ICD-10-CM

## 2017-06-15 DIAGNOSIS — R509 Fever, unspecified: Secondary | ICD-10-CM

## 2017-06-15 DIAGNOSIS — M6282 Rhabdomyolysis: Secondary | ICD-10-CM

## 2017-06-15 DIAGNOSIS — C55 Malignant neoplasm of uterus, part unspecified: Secondary | ICD-10-CM

## 2017-06-15 DIAGNOSIS — C775 Secondary and unspecified malignant neoplasm of intrapelvic lymph nodes: Secondary | ICD-10-CM

## 2017-06-15 LAB — CBC
HCT: 31.3 % — ABNORMAL LOW (ref 35.0–47.0)
HEMOGLOBIN: 10.2 g/dL — AB (ref 12.0–16.0)
MCH: 28.6 pg (ref 26.0–34.0)
MCHC: 32.5 g/dL (ref 32.0–36.0)
MCV: 88.1 fL (ref 80.0–100.0)
Platelets: 139 10*3/uL — ABNORMAL LOW (ref 150–440)
RBC: 3.55 MIL/uL — AB (ref 3.80–5.20)
RDW: 19.2 % — ABNORMAL HIGH (ref 11.5–14.5)
WBC: 5.9 10*3/uL (ref 3.6–11.0)

## 2017-06-15 LAB — MRSA PCR SCREENING: MRSA BY PCR: NEGATIVE

## 2017-06-15 LAB — BASIC METABOLIC PANEL
ANION GAP: 8 (ref 5–15)
BUN: 19 mg/dL (ref 6–20)
CALCIUM: 8.3 mg/dL — AB (ref 8.9–10.3)
CO2: 24 mmol/L (ref 22–32)
Chloride: 100 mmol/L — ABNORMAL LOW (ref 101–111)
Creatinine, Ser: 0.69 mg/dL (ref 0.44–1.00)
Glucose, Bld: 71 mg/dL (ref 65–99)
Potassium: 3.9 mmol/L (ref 3.5–5.1)
SODIUM: 132 mmol/L — AB (ref 135–145)

## 2017-06-15 LAB — TROPONIN I: TROPONIN I: 0.04 ng/mL — AB (ref <0.03)

## 2017-06-15 MED ORDER — METOPROLOL TARTRATE 25 MG PO TABS: 25.0000 mg | ORAL_TABLET | Freq: Two times a day (BID) | ORAL | Status: DC

## 2017-06-15 MED ORDER — METOPROLOL TARTRATE 5 MG/5ML IV SOLN
5.0000 mg | Freq: Once | INTRAVENOUS | Status: AC
  Administered 2017-06-15: 5 mg via INTRAVENOUS
  Filled 2017-06-15: qty 5

## 2017-06-15 MED ORDER — NALOXONE HCL 0.4 MG/ML IJ SOLN: 0.2000 mg | INTRAMUSCULAR | Status: DC | PRN

## 2017-06-15 MED ORDER — NALOXONE HCL 0.4 MG/ML IJ SOLN
INTRAMUSCULAR | Status: AC
  Filled 2017-06-15: qty 1

## 2017-06-15 NOTE — Plan of Care (Signed)
Problem: Physical Regulation: Goal: Signs and symptoms of infection will decrease Outcome: Not Progressing Patient spiked a temp of 103.2

## 2017-06-15 NOTE — Consult Note (Signed)
[facility]  Date of admission:  06/14/2017  Inpatient day:  06/15/2017  Consulting physician:  Dr. Gladstone Lighter   Reason for Consultation: Uterine leiomyosarcoma-worsening.  Chief Complaint: Cassidy Bradley is a 56 y.o. female with metastatic uterine leiomyosarcoma who was admitted through the emergency room with shortness of breath, fever and tachycardia.  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 She has received 4 cycles of gemcitabine and Taxotere (03/20/2015 - 05/22/2015),  8 cycles of adriamycin (06/07/2015 - 12/06/2015) with the addition of olaratumab (Lartruvo) with cycle #8.  Cycle #3 was complicated by an acute  left lower extremity DVT.  She received 3 cycles of single agent olaratumab [name] (12/27/2015 -02/08/2016).  She received 5 cycles of trabectedin (Yondelis) (03/13/2016 - 07/02/2016). She received 2 cycles of decarbazine (11/28/2016 - 12/25/2016).  She enrolled on the ALLIANCE phase II study(pazopanib vs. KQA0601) at [facility] on 02/10/2017. She randomized to [id number]. Cycle #1 began on03/07/2017.   Chest, abdomen, and pelvic CTon05/02/2017 revealedprogressive disease.  There were new and enlarging confluent lung metastasis, enlarging pancreatic neck mass, new tumor thrombus in the main portal vein, new and enlarging liver metastasis, enlarging right adrenal metastasis, new right paracolic gutter metastatic implants, and enlarging body wall metastatic implants (right gluteal region and left lateral abdominal wall).  She returned to the [facility] on 05/29/2017.  Pain medications were adjusted.  She began pazopanib on 05/30/2017.  She was last seen in the medical oncology clinic on 06/11/2017.  The evening before she had had 2 episodes of nausea and vomiting.  Pazopanib dose was decreased.  Pain was well controlled.  The patient presented on 06/14/2017 with shortness of breath, cough, and  increasing weakness.  She had chills and felt achy.  She was dehydrated.  She received IVF.  Cultures were obtained.  She was empirically started on Zosyn and vancomycin.  Chest CT angiogram on 06/12/2017 revealed no evidence for acute pulmonary embolus.  There was extensive pulmonary metastases. The dominant mass within the left upper lobe had increased in size in the interval.  Upper abdominal metastasis including liver, right adrenal gland and upper abdominal nodal metastasis have also progressed. Nodal mass within the upper abdomen encase the celiac artery and its branches.  There was tumor thrombus within the left upper lobe pulmonary vein.  Symptomatically, she is a little better according to her family.  She was initially unresponsive this morning.  Later today, she was more interactive.  She denied any symptoms.   Past Medical History:  Diagnosis Date  . Breast lump 2006    both breast   . Cancer ([place])    Uterine Leiomyosarcoma  . Chest wall mass   . DVT (deep venous thrombosis) ([facility])    Left leg  . Uterine cancer ([place]) 01/2015   chemo    Past Surgical History:  Procedure Laterality Date  . ABDOMINAL HYSTERECTOMY  01/30/15  . ANKLE SURGERY  2005  . BACK SURGERY  2005  . BREAST BIOPSY Bilateral 10 + yrs ago   neg  . CESAREAN SECTION  1993  . CHOLECYSTECTOMY  2001  . COLONOSCOPY      Family History  Problem Relation Age of Onset  . Colon polyps Mother   . Lupus Father   . Esophageal cancer Maternal Uncle   . Breast cancer Sister 39       fraternal twin sister  . Breast cancer Maternal Aunt   . Breast cancer Maternal  Grandmother 1  . Stomach cancer Maternal Aunt   . Lung cancer Maternal Grandfather   . Cancer - Colon Cousin 34       first cousin    Social History:  reports that she has never smoked. She has never used smokeless tobacco. She reports that she does not drink alcohol or use drugs.  She lives in [place].  The patient is accompanied by her brother-in-law  today.  I spoke to her husband, Merry Proud, on the phone  Allergies:  Allergies  Allergen Reactions  . Pravastatin Hives and Nausea And Vomiting  . Sulfa Antibiotics Hives and Swelling  . Latex Rash  . Other Rash and Other (See Comments)    Pt states that she is allergic to spandex.    Medications Prior to Admission  Medication Sig Dispense Refill  . docusate sodium (COLACE) 100 MG capsule Take 200 mg by mouth daily as needed.     . gabapentin (NEURONTIN) 300 MG capsule Take 300 mg by mouth 3 (three) times daily.    Marland Kitchen lidocaine-prilocaine (EMLA) cream Apply 1 application topically as needed. Apply one hour prior to chemotherapy 30 g 1  . magnesium citrate SOLN Take 1 Bottle by mouth once.    . magnesium hydroxide (MILK OF MAGNESIA) 400 MG/5ML suspension Take 15 mLs by mouth daily as needed for mild constipation.    . megestrol (MEGACE) 400 MG/10ML suspension Take 5 mLs (200 mg total) by mouth daily. 240 mL 0  . oxyCODONE (OXY IR/ROXICODONE) 5 MG immediate release tablet Take 1-2 tablets (5-10 mg total) by mouth every 4 (four) hours as needed. 60 tablet 0  . OXYCONTIN 10 MG 12 hr tablet Take 20 mg by mouth every 12 (twelve) hours.     . potassium chloride (KLOR-CON 10) 10 MEQ tablet Take 1 tablet (10 mEq total) by mouth daily. (Patient taking differently: Take 20 mEq by mouth daily. ) 30 tablet 0  . rivaroxaban (XARELTO) 20 MG TABS tablet Take 1 tablet by mouth  daily with supper 90 tablet 3  . senna-docusate (SENOKOT-S) 8.6-50 MG tablet Take 3 tablets by mouth at bedtime.    Marland Kitchen VOTRIENT 200 MG tablet Take 400 mg by mouth daily.     Marland Kitchen dexamethasone (DECADRON) 4 MG tablet Take 2 tablets by mouth once a day on the day after chemotherapy and then take 2 tablets two times a day for 2 days. Take with food. (Patient not taking: Reported on 05/26/2017) 30 tablet 1  . ferrous sulfate 325 (65 FE) MG tablet Take 1 tablet (325 mg total) by mouth 2 (two) times daily with a meal. (Patient not taking: Reported  on 05/26/2017) 60 tablet 0  . HYDROmorphone (DILAUDID) 2 MG tablet Take 1 tablet (2 mg total) by mouth every 12 (twelve) hours as needed for severe pain. (Patient not taking: Reported on 06/03/2017) 20 tablet 0  . oxyCODONE-acetaminophen (PERCOCET/ROXICET) 5-325 MG tablet Take 1 tablet by mouth every 6 (six) hours as needed for severe pain. (Patient not taking: Reported on 05/26/2017) 30 tablet 0  . prochlorperazine (COMPAZINE) 10 MG tablet Take 1 tablet (10 mg total) by mouth every 6 (six) hours as needed (Nausea or vomiting). (Patient not taking: Reported on 12/25/2016) 30 tablet 1  . promethazine (PHENERGAN) 12.5 MG tablet Take 1 tablet (12.5 mg total) by mouth every 6 (six) hours as needed for nausea or vomiting. (Patient not taking: Reported on 05/26/2017) 30 tablet 0    Review of Systems: GENERAL:  Limited  secondary to mental status.  Additional information provided by family.  Fever at home. PERFORMANCE STATUS (ECOG):  3-4 HEENT:  No apparent visual changes,sore throat, mouth sores or tenderness. Lungs:  Shortness of breath. Cough.  No hemoptysis. Cardiac:  No chest pain or palpitations. GI:   Poor oral intake.  No nausea or vomiting.  No constipation, melena, or hematochezia. GU:  No urgency, frequency, dysuria, or hematuria. Musculoskeletal: Back pain. Left sided chest wall pain.   Extremities: No lower extremity swelling.  No pain. Skin:  No rashes or skin changes. Neuro:  General weakness.  No headache.  No focal numbness or weakness. Endocrine:  No diabetes, thyroid issues. Psych:  No apparent mood changes. Pain:  Back pain. Review of systems:  All other systems reviewed and found to be negative.  Physical Exam:  Blood pressure (!) 135/116, pulse (!) 129, temperature 99.7 F (37.6 C), temperature source Oral, resp. rate (!) 28, height _0  (1.626 m), weight 107 lb (48.5 kg), SpO2 100 %.  GENERAL:  Cachectic appearing woman lying on the medical unit in no acute distress. MENTAL  STATUS:  Unresponsive. HEAD:  Curly graying hair in braids.  Temporal wasting.  Normocephalic, atraumatic, face symmetric, no Cushingoid features. EYES:  Brown eyes.  Pupils equal round and reactive to light and accomodation.  No conjunctivitis or scleral icterus. ENT:  Oropharynx clear without lesion.  Tongue normal. Mucous membranesdry.  RESPIRATORY:  Clear to auscultation anteriorly without rales, wheezes or rhonchi. CARDIOVASCULAR:  Tachycardia.  Regular rate and rhythm without murmur, rub or gallop. ABDOMEN:  Soft, non-tender, with active bowel sounds, and no hepatosplenomegaly.  No masses. SKIN:  No rashes, ulcers or lesions. EXTREMITIES:  No lower extremity edema. NEUROLOGICAL: Unresponsive at 8:30 AM.   Results for orders placed or performed during the hospital encounter of 06/17/2017 (from the past 48 hour(s))  Comprehensive metabolic panel     Status: Abnormal   Collection Time: 06/16/2017  1:24 PM  Result Value Ref Range   Sodium 131 (L) 135 - 145 mmol/L   Potassium 4.1 3.5 - 5.1 mmol/L   Chloride 94 (L) 101 - 111 mmol/L   CO2 26 22 - 32 mmol/L   Glucose, Bld 136 (H) 65 - 99 mg/dL   BUN 15 6 - 20 mg/dL   Creatinine, Ser 0.92 0.44 - 1.00 mg/dL   Calcium 9.2 8.9 - 10.3 mg/dL   Total Protein 8.0 6.5 - 8.1 g/dL   Albumin 2.7 (L) 3.5 - 5.0 g/dL   AST 74 (H) 15 - 41 U/L   ALT 23 14 - 54 U/L   Alkaline Phosphatase 341 (H) 38 - 126 U/L   Total Bilirubin 0.8 0.3 - 1.2 mg/dL   GFR calc non Af Amer >60 >60 mL/min   GFR calc Af Amer >60 >60 mL/min    Comment: (NOTE) The eGFR has been calculated using the CKD EPI equation. This calculation has not been validated in all clinical situations. eGFR's persistently <60 mL/min signify possible Chronic Kidney Disease.    Anion gap 11 5 - 15  Lactic acid, plasma     Status: Abnormal   Collection Time: 06/26/2017  1:24 PM  Result Value Ref Range   Lactic Acid, Venous 3.7 (HH) 0.5 - 1.9 mmol/L    Comment: CRITICAL RESULT CALLED TO, READ BACK  BY AND VERIFIED WITH ALIVIA BROOMER ON 06/23/2017 AT 1406 [facility]   CBC with Differential     Status: Abnormal   Collection Time: 06/01/2017  1:24 PM  Result Value Ref Range   WBC 6.2 3.6 - 11.0 K/uL   RBC 3.80 3.80 - 5.20 MIL/uL   Hemoglobin 11.1 (L) 12.0 - 16.0 g/dL   HCT 33.4 (L) 35.0 - 47.0 %   MCV 87.9 80.0 - 100.0 fL   MCH 29.1 26.0 - 34.0 pg   MCHC 33.2 32.0 - 36.0 g/dL   RDW 18.7 (H) 11.5 - 14.5 %   Platelets 250 150 - 440 K/uL   Neutrophils Relative % 89 %   Neutro Abs 5.5 1.4 - 6.5 K/uL   Lymphocytes Relative 4 %   Lymphs Abs 0.3 (L) 1.0 - 3.6 K/uL   Monocytes Relative 2 %   Monocytes Absolute 0.1 (L) 0.2 - 0.9 K/uL   Eosinophils Relative 3 %   Eosinophils Absolute 0.2 0 - 0.7 K/uL   Basophils Relative 2 %   Basophils Absolute 0.1 0 - 0.1 K/uL  Protime-INR     Status: Abnormal   Collection Time: 05/29/2017  1:24 PM  Result Value Ref Range   Prothrombin Time 19.7 (H) 11.4 - 15.2 seconds   INR 1.65   Culture, blood (Routine x 2)     Status: None (Preliminary result)   Collection Time: 06/14/2017  1:24 PM  Result Value Ref Range   Specimen Description BLOOD RIGHT ANTECUBITAL    Special Requests      BOTTLES DRAWN AEROBIC AND ANAEROBIC Blood Culture adequate volume   Culture NO GROWTH < 24 HOURS    Report Status PENDING   Culture, blood (Routine x 2)     Status: None (Preliminary result)   Collection Time: 06/15/2017  1:24 PM  Result Value Ref Range   Specimen Description BLOOD BLOOD RIGHT FOREARM    Special Requests      BOTTLES DRAWN AEROBIC AND ANAEROBIC Blood Culture adequate volume   Culture NO GROWTH < 24 HOURS    Report Status PENDING   Troponin I     Status: Abnormal   Collection Time: 06/03/2017  1:24 PM  Result Value Ref Range   Troponin I 0.04 (HH) <0.03 ng/mL    Comment: CRITICAL RESULT CALLED TO, READ BACK BY AND VERIFIED WITH OLIVIA BROOMER @ 1465 06/22/2017 BY [facility]   Lactic acid, plasma     Status: None   Collection Time: 06/21/2017  4:26 PM  Result Value Ref  Range   Lactic Acid, Venous 1.7 0.5 - 1.9 mmol/L  Urinalysis, Complete w Microscopic     Status: Abnormal   Collection Time: 06/09/2017  6:43 PM  Result Value Ref Range   Color, Urine YELLOW (A) YELLOW   APPearance CLEAR (A) CLEAR   Specific Gravity, Urine 1.035 (H) 1.005 - 1.030   pH 6.0 5.0 - 8.0   Glucose, UA NEGATIVE NEGATIVE mg/dL   Hgb urine dipstick NEGATIVE NEGATIVE   Bilirubin Urine NEGATIVE NEGATIVE   Ketones, ur NEGATIVE NEGATIVE mg/dL   Protein, ur NEGATIVE NEGATIVE mg/dL   Nitrite NEGATIVE NEGATIVE   Leukocytes, UA NEGATIVE NEGATIVE   RBC / HPF 0-5 0 - 5 RBC/hpf   WBC, UA 0-5 0 - 5 WBC/hpf   Bacteria, UA NONE SEEN NONE SEEN   Squamous Epithelial / LPF 0-5 (A) NONE SEEN   Mucous PRESENT   Vancomycin, trough     Status: Abnormal   Collection Time: 06/25/2017  8:36 PM  Result Value Ref Range   Vancomycin Tr 11 (L) 15 - 20 ug/mL  CBC with Differential  Status: Abnormal   Collection Time: 06/24/2017  8:36 PM  Result Value Ref Range   WBC 5.5 3.6 - 11.0 K/uL   RBC 4.08 3.80 - 5.20 MIL/uL   Hemoglobin 11.5 (L) 12.0 - 16.0 g/dL   HCT 35.5 35.0 - 47.0 %   MCV 87.1 80.0 - 100.0 fL   MCH 28.2 26.0 - 34.0 pg   MCHC 32.3 32.0 - 36.0 g/dL   RDW 18.8 (H) 11.5 - 14.5 %   Platelets 213 150 - 440 K/uL   Neutrophils Relative % 92 %   Neutro Abs 5.1 1.4 - 6.5 K/uL   Lymphocytes Relative 6 %   Lymphs Abs 0.3 (L) 1.0 - 3.6 K/uL   Monocytes Relative 1 %   Monocytes Absolute 0.0 (L) 0.2 - 0.9 K/uL   Eosinophils Relative 1 %   Eosinophils Absolute 0.0 0 - 0.7 K/uL   Basophils Relative 0 %   Basophils Absolute 0.0 0 - 0.1 K/uL  Comprehensive metabolic panel     Status: Abnormal   Collection Time: 06/04/2017  8:36 PM  Result Value Ref Range   Sodium 131 (L) 135 - 145 mmol/L   Potassium 4.0 3.5 - 5.1 mmol/L   Chloride 98 (L) 101 - 111 mmol/L   CO2 22 22 - 32 mmol/L   Glucose, Bld 96 65 - 99 mg/dL   BUN 12 6 - 20 mg/dL   Creatinine, Ser 0.64 0.44 - 1.00 mg/dL   Calcium 8.5 (L) 8.9  - 10.3 mg/dL   Total Protein 7.2 6.5 - 8.1 g/dL   Albumin 2.5 (L) 3.5 - 5.0 g/dL   AST 118 (H) 15 - 41 U/L   ALT 31 14 - 54 U/L   Alkaline Phosphatase 320 (H) 38 - 126 U/L   Total Bilirubin 0.9 0.3 - 1.2 mg/dL   GFR calc non Af Amer >60 >60 mL/min   GFR calc Af Amer >60 >60 mL/min    Comment: (NOTE) The eGFR has been calculated using the CKD EPI equation. This calculation has not been validated in all clinical situations. eGFR's persistently <60 mL/min signify possible Chronic Kidney Disease.    Anion gap 11 5 - 15  Procalcitonin     Status: None   Collection Time: 06/01/2017  8:36 PM  Result Value Ref Range   Procalcitonin 2.48 ng/mL    Comment:        Interpretation: PCT > 2 ng/mL: Systemic infection (sepsis) is likely, unless other causes are known. (NOTE)         ICU PCT Algorithm               Non ICU PCT Algorithm    ----------------------------     ------------------------------         PCT < 0.25 ng/mL                 PCT < 0.1 ng/mL     Stopping of antibiotics            Stopping of antibiotics       strongly encouraged.               strongly encouraged.    ----------------------------     ------------------------------       PCT level decrease by               PCT < 0.25 ng/mL       >= 80% from peak PCT       OR PCT  0.25 - 0.5 ng/mL          Stopping of antibiotics                                             encouraged.     Stopping of antibiotics           encouraged.    ----------------------------     ------------------------------       PCT level decrease by              PCT >= 0.25 ng/mL       < 80% from peak PCT        AND PCT >= 0.5 ng/mL            Continuing antibiotics                                               encouraged.       Continuing antibiotics            encouraged.    ----------------------------     ------------------------------     PCT level increase compared          PCT > 0.5 ng/mL         with peak PCT AND          PCT >= 0.5 ng/mL              Escalation of antibiotics                                          strongly encouraged.      Escalation of antibiotics        strongly encouraged.   Protime-INR     Status: Abnormal   Collection Time: 06/21/2017  8:36 PM  Result Value Ref Range   Prothrombin Time 24.1 (H) 11.4 - 15.2 seconds   INR 2.12   APTT     Status: Abnormal   Collection Time: 06/24/2017  8:36 PM  Result Value Ref Range   aPTT 40 (H) 24 - 36 seconds    Comment:        IF BASELINE aPTT IS ELEVATED, SUGGEST PATIENT RISK ASSESSMENT BE USED TO DETERMINE APPROPRIATE ANTICOAGULANT THERAPY.   Lactic acid, plasma     Status: Abnormal   Collection Time: 06/17/2017  8:37 PM  Result Value Ref Range   Lactic Acid, Venous 3.7 (HH) 0.5 - 1.9 mmol/L    Comment: CRITICAL RESULT CALLED TO, READ BACK BY AND VERIFIED WITH KATT MURRAY ON 05/31/2017 AT 2121 [facility]   Lactic acid, plasma     Status: Abnormal   Collection Time: 06/11/2017  9:57 PM  Result Value Ref Range   Lactic Acid, Venous 4.8 (HH) 0.5 - 1.9 mmol/L    Comment: CRITICAL RESULT CALLED TO, READ BACK BY AND VERIFIED WITH KAT MURRAY AT 2238 06/25/2017.PMH  Lactic acid, plasma     Status: None   Collection Time: 06/14/17  1:24 AM  Result Value Ref Range   Lactic Acid, Venous 1.1 0.5 - 1.9 mmol/L   Ct Angio Chest Pe W Or Wo Contrast  Result Date: 06/10/2017  CLINICAL DATA:  Lung cancer.  Shortness of breath and fever. EXAM: CT ANGIOGRAPHY CHEST WITH CONTRAST TECHNIQUE: Multidetector CT imaging of the chest was performed using the standard protocol during bolus administration of intravenous contrast. Multiplanar CT image reconstructions and MIPs were obtained to evaluate the vascular anatomy. CONTRAST:  75 cc of Isovue 370 COMPARISON:  05/26/2017 . FINDINGS: Cardiovascular: Satisfactory opacification of the pulmonary arteries to the segmental level. No evidence of pulmonary embolism. Again noted is a filling defect within the left upper lobe pulmonary vein compatible with  tumor thrombus extending inferiorly from the patient's large anterior left upper lobe metastasis. Normal heart size. No pericardial effusion. Mediastinum/Nodes: No enlarged mediastinal, hilar, or axillary lymph nodes. Thyroid gland, trachea, and esophagus demonstrate no significant findings. Lungs/Pleura: Small left pleural effusion is new from previous exam. Widespread pulmonary metastasis is identified bilaterally. The dominant mass within the medial left upper lobe measures 11 by 5.5 cm, image 24 of series 4. Previously 8.5 x 5.3 cm. Upper Abdomen: Multifocal liver metastases are again identified. Index lesion within segment 8 measures 2.7 cm, image 72 of series 4. Previously 2.5 cm. The right adrenal gland metastases measure 6.4 cm, image 87 of series 4. Previously 4.7 cm. Confluent nodal mass within the upper abdomen measures approximately 7.8 cm and is now encasing the celiac trunk and its branches. Previously this measured 4.7 cm. Musculoskeletal: No chest wall abnormality. No acute or significant osseous findings. Review of the MIP images confirms the above findings. IMPRESSION: 1. No evidence for acute pulmonary embolus. 2. Extensive pulmonary metastases is again noted. The dominant mass within the left upper lobe has increased in size in the interval. 3. Upper abdominal metastasis including liver, right adrenal gland and upper abdominal nodal metastasis have progressed from previous exam. Nodal mass within the upper abdomen now encases the celiac artery and its branches. 4. Tumor thrombus within the left upper lobe pulmonary vein is again noted. Electronically Signed   By: Kerby Moors M.D.   On: 06/14/2017 16:05   Dg Chest Port 1 View  Result Date: 06/06/2017 CLINICAL DATA:  56 y/o  F; sepsis. EXAM: PORTABLE CHEST 1 VIEW COMPARISON:  06/25/2017 chest CT. FINDINGS: Left suprahilar mass and numerous pulmonary nodules are better characterized on the prior CT of the chest and not significantly changed  given differences in technique. Stable cardiac silhouette. Left port catheter tip projects over the right atrium. No definite new consolidation or effusion. No acute osseous abnormality. Right upper quadrant cholecystectomy clips. IMPRESSION: 1. Left suprahilar mass and numerous pulmonary nodules are better characterized on prior chest CT and not significantly changed given differences in technique. 2. No definite new consolidation or effusion. Electronically Signed   By: Kristine Garbe M.D.   On: 05/30/2017 18:56   Dg Chest Port 1 View  Result Date: 06/05/2017 CLINICAL DATA:  Shortness of breath and fever. History of metastatic uterine leiomyosarcoma. EXAM: PORTABLE CHEST 1 VIEW COMPARISON:  Chest CT 05/26/2017 and chest radiograph 05/26/2017 FINDINGS: Again noted is a left subclavian Port-A-Cath. Catheter tip is near the upper right atrium. Again noted is a large density near the aortic arch compatible with the patient's known left upper chest/mediastinal mass. Scattered parenchymal densities throughout both lungs compatible with known metastatic disease. The disease burden in the chest has not significantly changed since the recent comparison examination. Heart size is stable. Moderate amount of gas in the bowel structures in the upper abdomen. IMPRESSION: Stable appearance of the metastatic disease within the chest. No  significant change from the most recent comparison examination. Electronically Signed   By: Markus Daft M.D.   On: 06/22/2017 14:34    Assessment:  The patient is a 56 y.o. woman with metastatic uterine leiomyosarcoma s/p multiple cycles of chemotherapy.  She has received gemcitabine and Taxotere, adriamycin, olaratumab (Lartruvo), trabectedin (Yondelis), decarbazine, and [id number] study drug at [facility].  Disease has progressed.  She began pazopanib on 05/30/2017.  Chest, abdomen, and pelvic CTon05/02/2017 revealedprogressive disease.  There were new and enlarging confluent lung  metastasis, enlarging pancreatic neck mass, new tumor thrombus in the main portal vein, new and enlarging liver metastasis, enlarging right adrenal metastasis, new right paracolic gutter metastatic implants, and enlarging body wall metastatic implants (right gluteal region and left lateral abdominal wall).  Chest CT angiogram on 06/26/2017 revealed no evidence for acute pulmonary embolus.  There was extensive pulmonary metastases. The dominant mass within the left upper lobe had increased in size in the interval.  Upper abdominal metastasis including liver, right adrenal gland and upper abdominal nodal metastasis have also progressed. Nodal mass within the upper abdomen encase the celiac artery and its branches.  There was tumor thrombus within the left upper lobe pulmonary vein.  She has a history of left lower extremity DVT on 07/19/2015.  She is on Xarelto.  She presented with fever, chills, and cough.  She is on broad spectrum antibiotics (vancomycin and Zosyn).  Cultures are negative to date.  Plan:   1.  Oncology:  Patient has metastatic uterine leiomyosarcoma.  Her disease is aggressive and has grown through multiple regimens.  She recently began pazopanib (05/30/2017).   Response rates are estimated at 6%.  Agree with holding pazopanib while hospitalized.  Spoke with patient's husband about limited life expectancy.  He confirmed code status is DNR/DNI.  They had discussed this many times in the past.  2.  Hematology:  Counts adequate.  Continue Xarelto for history of DVT.  3.  Infectious disease:  Patient presented with sepsis.  Source unclear.  Cultures negative to date.  Continue broad spectrum antibiotics (vancomycin and Zosyn).  Blood cultures q 24 hours prn temp > 100.4.  4.  Pulmonary:  Patient presented with shortness of breath.  She has extensive pulmonary metastasis and tumor thrombus in the left upper lobe pulmonary vein.  Continue supportive care.  5.   Fluids/Electrolytes/Nutrition:  Nutritional status poor.  IVF continue.  Ensure.  Megace to increase appetite.  6.  Code status:  DNR/DNI.   7.  Disposition:  Agree with palliative care consult.   Thank you for allowing me to participate in BEVELYN ARRIOLA 's care.  I will follow her closely with you while hospitalized and after discharge in the outpatient department.   Lequita Asal, MD  06/15/2017, 6:49 AM

## 2017-06-15 NOTE — Progress Notes (Signed)
Patient was confused unable to tell her name or where she was. Decrease level of alertness.  O2 sat was 88 % on room air.  02  at 2 L/[place]  applied and it came up to 94%. Patient became more alert after the oxygen came up. No change in other vital signs. Patient refused oxygen last night since according to her she was itching from the tubing ( possible latex in the tubing she said) The RN noticed she has some hives scattered all over her body.  Rapid response called when patient was not responding and Dr. Marcille Blanco came as well. Dr. Marcille Blanco is aware  of the hives and he said he was not going to give Benadryl since patient is very drowsy.  Narcan 2 mg IV received from Dr. Marcille Blanco but he later told the RN to hold since patient was more alert. Patient has not urinated the whole night. Bladder scan read 219 mL.. Will continue to monitor.

## 2017-06-15 NOTE — Progress Notes (Signed)
Nutrition Brief Follow-up  DOCUMENTATION CODES:   Severe malnutrition in context of chronic illness, Underweight  INTERVENTION:  Able to review "Taste and Smell Changes" handout with patient and her sister today. Reviewed strategies to manage taste changes and help improve PO intake.  Continue Ensure Enlive po TID, each supplement provides 350 kcal and 20 grams of protein.   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  Estimated Nutritional Needs: Kcal:  1500-1710 (MSJ x 1.4-1.6) Protein:  75-90 grams (1.5-1.8 grams/kg) Fluid:  1.5-1.7 L/day (30-35 ml/kg)  Willey Blade, MS, RD, LDN Pager: [phone] After Hours Pager: [phone]

## 2017-06-15 NOTE — Progress Notes (Signed)
Pt is very lethargic, BP is elevated, ST on telemetry. MD notified. Orders to give 5 mg IV metoprolol x1 and MD will round on patient. I will continue to assess.

## 2017-06-15 NOTE — Progress Notes (Addendum)
[facility] at [place] NAME: Cassidy Bradley    MR#:  [id number]  DATE OF BIRTH:  06/08/1961  SUBJECTIVE:  CHIEF COMPLAINT:   Chief Complaint  Patient presents with  . Code Sepsis  . Shortness of Breath  Patient is confused and lethargic today. Also had episode of tachycardia.  REVIEW OF SYSTEMS:  Review of Systems  Unable to perform ROS: Critical illness    DRUG ALLERGIES:   Allergies  Allergen Reactions  . Pravastatin Hives and Nausea And Vomiting  . Sulfa Antibiotics Hives and Swelling  . Latex Rash  . Other Rash and Other (See Comments)    Pt states that she is allergic to spandex.    VITALS:  Blood pressure 114/87, pulse (!) 122, temperature 98.2 F (36.8 C), temperature source Oral, resp. rate 16, height 5\' 4"  (1.626 m), weight 48.5 kg (107 lb), SpO2 96 %.  PHYSICAL EXAMINATION:  Physical Exam  GENERAL:  56 y.o.-year-old ill nourished patient lying in the bed with no acute distress.  EYES: Pupils equal, round, reactive to light and accommodation. No scleral icterus. Extraocular muscles intact.  HEENT: Head atraumatic, normocephalic. Oropharynx and nasopharynx clear.  NECK:  Supple, no jugular venous distention. No thyroid enlargement, no tenderness.  LUNGS: Normal breath sounds bilaterally,Scattered rhonchi in the upper lobes, no wheezing, rales or crepitation. No use of accessory muscles of respiration.  CARDIOVASCULAR: S1, S2 normal. No  rubs, or gallops. 2/6 systolic murmur is present ABDOMEN: Soft, nontender, nondistended. Bowel sounds present. No organomegaly or mass.  EXTREMITIES: No pedal edema, cyanosis, or clubbing.  NEUROLOGIC: Cranial nerves not checked. Muscle strength 1-2/5 in all extremities. Sensation intact. Gait not checked.  PSYCHIATRIC: The patient is drowsy, arousable- confused. SKIN: No obvious rash, lesion, or ulcer.    LABORATORY PANEL:   CBC  Recent Labs Lab 06/15/17 1102  WBC 5.9  HGB  10.2*  HCT 31.3*  PLT 139*   ------------------------------------------------------------------------------------------------------------------  Chemistries   Recent Labs Lab 06/05/2017 2036 06/15/17 1102  NA 131* 132*  K 4.0 3.9  CL 98* 100*  CO2 22 24  GLUCOSE 96 71  BUN 12 19  CREATININE 0.64 0.69  CALCIUM 8.5* 8.3*  AST 118*  --   ALT 31  --   ALKPHOS 320*  --   BILITOT 0.9  --    ------------------------------------------------------------------------------------------------------------------  Cardiac Enzymes  Recent Labs Lab 06/18/2017 1324  TROPONINI 0.04*   ------------------------------------------------------------------------------------------------------------------  RADIOLOGY:  Dg Chest Port 1 View  Result Date: 06/26/2017 CLINICAL DATA:  57 y/o  F; sepsis. EXAM: PORTABLE CHEST 1 VIEW COMPARISON:  06/17/2017 chest CT. FINDINGS: Left suprahilar mass and numerous pulmonary nodules are better characterized on the prior CT of the chest and not significantly changed given differences in technique. Stable cardiac silhouette. Left port catheter tip projects over the right atrium. No definite new consolidation or effusion. No acute osseous abnormality. Right upper quadrant cholecystectomy clips. IMPRESSION: 1. Left suprahilar mass and numerous pulmonary nodules are better characterized on prior chest CT and not significantly changed given differences in technique. 2. No definite new consolidation or effusion. Electronically Signed   By: Kristine Garbe M.D.   On: 06/03/2017 18:56    EKG:   Orders placed or performed in visit on 05/31/2017  . 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 -Blood cultures sent. negative -Continue vancomycin and Zosyn. No other  source of infection identified. Lactic acid is normalizing.  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 Oncology consult. - prognosis very poor now with decreased oral intake and sepsis with pneumonia. Call palliative care consult.  #4 severe malnutrition-dietary consult and supplements. 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 Rapid ventricular rate, hypertension  Started on metoprolol, 1 dose IV metoprolol injection given.  All the records are reviewed and case discussed with Care Management/Social Workerr. Management plans discussed with the patient, family and they are in agreement.  CODE STATUS: DNR  TOTAL TIME TAKING CARE OF THIS PATIENT: 40 critical care minutes.   POSSIBLE D/C IN 1-2 DAYS, DEPENDING ON CLINICAL CONDITION. I discussed with patient's husband and patient's sister and brother-in-law in the room. Updated them about the findings and treatment plan.  Vaughan Basta M.D on 06/15/2017 at 5:23 PM  Between 7am to 6pm - Pager - [phone]  After 6pm go to [email or web address] - password [email or web address] [facility]  Office  [phone]  CC: Primary care physician; [place] Pink, MD

## 2017-06-15 NOTE — Progress Notes (Addendum)
Patient was having a elevated temp of 103.2  Tylenol 650 mg oral PRN administered and on rechecked the temp came down to 99.7. 02 sat was 90 on R/A.Marland Kitchen 02 at 2L/[place] applied and the reading went up to 96 %. Patient and the husband requested to continue taking her chemotherapy drug  (Votrient) whilst in the hospital.  Dr. Claria Dice notified and she indicated we're not going to give the Votrient while the patient is sick and this should be communicated to the rounding doctor tomorrow. The patient and the husband were okay with Dr. Claria Dice message and they did not voice any more concern. Will continue to monitor.

## 2017-06-16 DIAGNOSIS — Z515 Encounter for palliative care: Secondary | ICD-10-CM

## 2017-06-16 DIAGNOSIS — Z66 Do not resuscitate: Secondary | ICD-10-CM

## 2017-06-16 DIAGNOSIS — G893 Neoplasm related pain (acute) (chronic): Secondary | ICD-10-CM

## 2017-06-16 LAB — BLOOD GAS, ARTERIAL
ACID-BASE DEFICIT: 1.3 mmol/L (ref 0.0–2.0)
Allens test (pass/fail): POSITIVE — AB
BICARBONATE: 21.8 mmol/L (ref 20.0–28.0)
FIO2: 0.28
O2 SAT: 97.6 %
PCO2 ART: 30 mmHg — AB (ref 32.0–48.0)
PH ART: 7.47 — AB (ref 7.350–7.450)
PO2 ART: 91 mmHg (ref 83.0–108.0)
Patient temperature: 37

## 2017-06-16 LAB — VANCOMYCIN, TROUGH: VANCOMYCIN TR: 14 ug/mL — AB (ref 15–20)

## 2017-06-16 MED ORDER — SODIUM CHLORIDE 0.9 % IV SOLN
INTRAVENOUS | Status: AC
  Administered 2017-06-16: 75 mL/h via INTRAVENOUS
  Administered 2017-06-16: 09:00:00 via INTRAVENOUS

## 2017-06-16 MED ORDER — SODIUM CHLORIDE 0.9 % IV BOLUS (SEPSIS)
1000.0000 mL | Freq: Once | INTRAVENOUS | Status: AC
  Administered 2017-06-16: 1000 mL via INTRAVENOUS

## 2017-06-16 MED ORDER — DIPHENHYDRAMINE HCL 50 MG/ML IJ SOLN
12.5000 mg | Freq: Once | INTRAMUSCULAR | Status: AC
Start: 2017-06-16 — End: 2017-06-16
  Administered 2017-06-16: 12.5 mg via INTRAVENOUS
  Filled 2017-06-16: qty 1

## 2017-06-16 MED ORDER — HYDROCOD POLST-CPM POLST ER 10-8 MG/5ML PO SUER: 5.0000 mL | Freq: Two times a day (BID) | ORAL | Status: DC | PRN

## 2017-06-16 MED ORDER — OXYCODONE HCL 5 MG PO TABS: 5.0000 mg | ORAL_TABLET | Freq: Four times a day (QID) | ORAL | Status: DC | PRN

## 2017-06-16 NOTE — Progress Notes (Signed)
[facility] Hematology/Oncology Progress Note  Date of admission: 06/11/2017  Hospital day:  06/16/2017  Chief Complaint: Cassidy Bradley is a 56 y.o. female with metastatic uterine leiomyosarcoma who was admitted through the emergency room with shortness of breath, fever and tachycardia.  Subjective:  Patient does not respond to questioning.  Social History: The patient is alone today.  Allergies:  Allergies  Allergen Reactions  . Pravastatin Hives and Nausea And Vomiting  . Sulfa Antibiotics Hives and Swelling  . Latex Rash  . Other Rash and Other (See Comments)    Pt states that she is allergic to spandex.    Scheduled Medications: . feeding supplement (ENSURE ENLIVE)  237 mL Oral TID BM  . ferrous sulfate  325 mg Oral BID WC  . gabapentin  300 mg Oral TID  . megestrol  200 mg Oral Daily  . oxyCODONE  20 mg Oral Q12H  . potassium chloride  10 mEq Oral Daily  . rivaroxaban  20 mg Oral Q supper  . senna-docusate  3 tablet Oral QHS    Review of Systems: GENERAL: Unable to be performed secondary to mental status.  PERFORMANCE STATUS (ECOG): 4  Physical Exam: Blood pressure 95/69, pulse (!) 126, temperature 98.9 F (37.2 C), temperature source Oral, resp. rate 18, height '5\' 4"'$  (1.626 m), weight 107 lb (48.5 kg), SpO2 97 %.  GENERAL:Cachectic appearing woman lying on the medical unit in no acute distress. MENTAL STATUS: Open eyes to voice. HEAD:Curly grayinghair in braids. Temporal wasting.  Normocephalic, atraumatic, face symmetric, no Cushingoid features. EYES:Brown eyes. Pupils equal round and reactive to light and accomodation. No conjunctivitis or scleral icterus. ZDG:[id number] clear without lesion. Tonguenormal. Mucous membranesdry. RESPIRATORY:Clear to auscultationanteriorly without rales, wheezes or rhonchi. CARDIOVASCULAR:Tachycardia.  Regular rate andrhythmwithout murmur, rub or gallop. ABDOMEN:Soft, non-tender,  with active bowel sounds, and no hepatosplenomegaly. No masses. SKIN: No rashes, ulcers or lesions. EXTREMITIES: No left lower extremity.  NEUROLOGICAL: Opens eyes to voice.  Does not respond to questions. Makes a few noises.  Smiles then closes eyes.   Results for orders placed or performed during the hospital encounter of 06/07/2017 (from the past 48 hour(s))  Basic metabolic panel     Status: Abnormal   Collection Time: 06/15/17 11:02 AM  Result Value Ref Range   Sodium 132 (L) 135 - 145 mmol/L   Potassium 3.9 3.5 - 5.1 mmol/L   Chloride 100 (L) 101 - 111 mmol/L   CO2 24 22 - 32 mmol/L   Glucose, Bld 71 65 - 99 mg/dL   BUN 19 6 - 20 mg/dL   Creatinine, Ser 0.69 0.44 - 1.00 mg/dL   Calcium 8.3 (L) 8.9 - 10.3 mg/dL   GFR calc non Af Amer >60 >60 mL/min   GFR calc Af Amer >60 >60 mL/min    Comment: (NOTE) The eGFR has been calculated using the CKD EPI equation. This calculation has not been validated in all clinical situations. eGFR's persistently <60 mL/min signify possible Chronic Kidney Disease.    Anion gap 8 5 - 15  CBC     Status: Abnormal   Collection Time: 06/15/17 11:02 AM  Result Value Ref Range   WBC 5.9 3.6 - 11.0 K/uL   RBC 3.55 (L) 3.80 - 5.20 MIL/uL   Hemoglobin 10.2 (L) 12.0 - 16.0 g/dL   HCT 31.3 (L) 35.0 - 47.0 %   MCV 88.1 80.0 - 100.0 fL   MCH 28.6 26.0 - 34.0 pg   MCHC  32.5 32.0 - 36.0 g/dL   RDW 19.2 (H) 11.5 - 14.5 %   Platelets 139 (L) 150 - 440 K/uL  MRSA PCR Screening     Status: None   Collection Time: 06/15/17  1:40 PM  Result Value Ref Range   MRSA by PCR NEGATIVE NEGATIVE    Comment:        The GeneXpert MRSA Assay (FDA approved for NASAL specimens only), is one component of a comprehensive MRSA colonization surveillance program. It is not intended to diagnose MRSA infection nor to guide or monitor treatment for MRSA infections.   Vancomycin, trough     Status: Abnormal   Collection Time: 06/16/17  7:57 AM  Result Value Ref Range    Vancomycin Tr 14 (L) 15 - 20 ug/mL  Blood gas, arterial     Status: Abnormal   Collection Time: 06/16/17  1:44 PM  Result Value Ref Range   FIO2 0.28    Delivery systems NASAL CANNULA    pH, Arterial 7.47 (H) 7.350 - 7.450   pCO2 arterial 30 (L) 32.0 - 48.0 mmHg   pO2, Arterial 91 83.0 - 108.0 mmHg   Bicarbonate 21.8 20.0 - 28.0 mmol/L   Acid-base deficit 1.3 0.0 - 2.0 mmol/L   O2 Saturation 97.6 %   Patient temperature 37.0    Collection site LEFT RADIAL    Sample type ARTERIAL DRAW    Allens test (pass/fail) POSITIVE (A) PASS   No results found.  Assessment:  Cassidy Bradley is a 56 y.o. female with metastatic uterine leiomyosarcoma s/p multiple cycles of chemotherapy.  She has received gemcitabine and Taxotere, adriamycin, olaratumab (Lartruvo), trabectedin (Yondelis), decarbazine, and [id number] study drug at [facility].  Disease has progressed.  She began pazopanib on 05/30/2017.  Chest, abdomen, and pelvic CTon05/02/2017 revealedprogressive disease. There were new and enlarging confluent lung metastasis, enlarging pancreatic neck mass, new tumor thrombus in the main portal vein, new and enlarging liver metastasis, enlarging right adrenal metastasis, new right paracolic gutter metastatic implants, and enlarging body wall metastatic implants (right gluteal region and left lateral abdominal wall).  Chest CT angiogram on 06/01/2017 revealed no evidence for acute pulmonary embolus.  There was extensive pulmonary metastases. The dominant mass within the left upper lobe had increased in size in the interval.  Upper abdominal metastasis including liver, right adrenal gland and upper abdominal nodal metastasis have also progressed. Nodal mass within the upper abdomen encase the celiac artery and its branches.  There was tumor thrombus within the left upper lobe pulmonary vein.  She has a history of left lower extremity DVT on 07/19/2015.  She is on Xarelto.  She presented with fever,  chills, and cough.  She is on broad spectrum antibiotics  (Zosyn).  Cultures are negative to date.  She is poorly interactive.  Plan:   1.  Oncology:  Patient has metastatic uterine leiomyosarcoma.  Her disease is aggressive and has grown through multiple regimens.  She recently began pazopanib (05/30/2017).   Response rates are estimated at 6%.  Agree with holding pazopanib while hospitalized.  Spoke with patient's husband about limited life expectancy.  He confirmed code status is DNR/DNI.  They had discussed this many times in the past.  2.  Hematology:  Counts adequate.  Continue Xarelto for history of DVT.  3.  Infectious disease:  Patient presented with sepsis.  Source unclear.  Cultures negative to date.  Continue broad spectrum antibiotics (Zosyn).  Vancomycin discontinued today.  Blood cultures q 24  hours prn temp > 100.4.  4.  Pulmonary:  Patient presented with shortness of breath.  She has extensive pulmonary metastasis and tumor thrombus in the left upper lobe pulmonary vein.  Continue supportive care.  5.  Fluids/Electrolytes/Nutrition:  Nutritional status poor.  IVF continue.  Ensure.  Megace to increase appetite.  Appreciate nutrition consult.  Current mental status precludes eating.  6.  Code status:  DNR/DNI.   7.  Disposition:  Appreciate palliative care consult.  [facility] would be a good [facility] the [facility].   Lequita Asal, MD  06/16/2017, 9:41 PM

## 2017-06-16 NOTE — Progress Notes (Signed)
[facility] at [place] NAME: Cassidy Bradley    MR#:  [id number]  DATE OF BIRTH:  1961/11/05  SUBJECTIVE:  CHIEF COMPLAINT:   Chief Complaint  Patient presents with  . Code Sepsis  . Shortness of Breath  Patient is confused But more alert today. Also had episode of tachycardia.  REVIEW OF SYSTEMS:  Review of Systems  Unable to perform ROS: Critical illness    DRUG ALLERGIES:   Allergies  Allergen Reactions  . Pravastatin Hives and Nausea And Vomiting  . Sulfa Antibiotics Hives and Swelling  . Latex Rash  . Other Rash and Other (See Comments)    Pt states that she is allergic to spandex.    VITALS:  Blood pressure 98/63, pulse (!) 121, temperature 97.8 F (36.6 C), resp. rate 20, height 5\' 4"  (1.626 m), weight 48.5 kg (107 lb), SpO2 93 %.  PHYSICAL EXAMINATION:  Physical Exam  GENERAL:  56 y.o.-year-old ill nourished patient lying in the bed with no acute distress.  EYES: Pupils equal, round, reactive to light and accommodation. No scleral icterus. Extraocular muscles intact.  HEENT: Head atraumatic, normocephalic. Oropharynx and nasopharynx clear.  NECK:  Supple, no jugular venous distention. No thyroid enlargement, no tenderness.  LUNGS: Normal breath sounds bilaterally,Scattered rhonchi in the upper lobes, no wheezing, rales or crepitation. No use of accessory muscles of respiration.  CARDIOVASCULAR: S1, S2 normal. No  rubs, or gallops. 2/6 systolic murmur is present ABDOMEN: Soft, nontender, nondistended. Bowel sounds present. No organomegaly or mass.  EXTREMITIES: No pedal edema, cyanosis, or clubbing.  NEUROLOGIC: Cranial nerves not checked. Muscle strength 1-2/5 in all extremities. Sensation intact. Gait not checked.  PSYCHIATRIC: The patient is alert- confused. SKIN: No obvious rash, lesion, or ulcer.    LABORATORY PANEL:   CBC  Recent Labs Lab 06/15/17 1102  WBC 5.9  HGB 10.2*  HCT 31.3*  PLT 139*    ------------------------------------------------------------------------------------------------------------------  Chemistries   Recent Labs Lab 06/06/2017 2036 06/15/17 1102  NA 131* 132*  K 4.0 3.9  CL 98* 100*  CO2 22 24  GLUCOSE 96 71  BUN 12 19  CREATININE 0.64 0.69  CALCIUM 8.5* 8.3*  AST 118*  --   ALT 31  --   ALKPHOS 320*  --   BILITOT 0.9  --    ------------------------------------------------------------------------------------------------------------------  Cardiac Enzymes  Recent Labs Lab 06/18/2017 1324  TROPONINI 0.04*   ------------------------------------------------------------------------------------------------------------------  RADIOLOGY:  No results found.  EKG:   Orders placed or performed in visit on 06/15/2017  . 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 -Blood cultures sent. negative -Continue vancomycin and Zosyn. No other source of infection identified. Lactic acid is normalizing.   MRSA screen is negative so DC vancomycin.  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 Oncology consult. - prognosis very poor now with decreased oral intake and sepsis with pneumonia. Called palliative care consult.  #4 severe malnutrition-dietary consult and supplements. 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 Rapid ventricular rate, hypertension  Started on metoprolol, 1 dose IV metoprolol injection given.  Now patient is hypotensive also so stop  metoprolol and started on IV fluids.  All the records are reviewed and case discussed with  Care Management/Social Workerr. Management plans discussed with the patient, family and they are in agreement.  CODE STATUS: DNR  TOTAL TIME TAKING CARE OF THIS PATIENT: 40 minutes.   POSSIBLE D/C IN 1-2 DAYS, DEPENDING ON CLINICAL CONDITION. I discussed with patient's husband and patient's sister and brother-in-law in the room. Updated them about the findings and treatment plan.  Vaughan Basta M.D on 06/16/2017 at 2:10 PM  Between 7am to 6pm - Pager - [phone]  After 6pm go to [email or web address] - password [email or web address] [place]  Office  [phone]  CC: Primary care physician; [place] Pink, MD

## 2017-06-16 NOTE — Progress Notes (Signed)
MD was made aware of pt's tip of nose is cyanotic , ABG  Was order , will continue to monitor

## 2017-06-16 NOTE — Consult Note (Signed)
Consultation Note Date: 06/16/2017   Patient Name: Cassidy Bradley  DOB: 1961/08/11  MRN: [id number]  Age / Sex: 56 y.o., female  PCP: Maryland Pink, MD Referring Physician: Vaughan Basta, *  Reason for Consultation: Establishing goals of care and Psychosocial/spiritual support  HPI/Patient Profile: 56 y.o. female  admitted on 06/27/2017 with significant medical history of metastatic uterine leiomyosarcoma who was admitted through the emergency room with shortness of breath, fever and tachycardia.  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 She has received 4 cycles of gemcitabine and Taxotere (03/20/2015 - 05/22/2015),  8 cycles of adriamycin(06/07/2015 - 12/06/2015) with the addition of olaratumab (Lartruvo) with cycle #8.  Cycle #3 was complicated by an acute left lower extremity DVT. She received 3 cycles of single agent olaratumab (Lartruvo)(12/27/2015 -02/08/2016). She received 5 cycles oftrabectedin (Yondelis)(03/13/2016 - 07/02/2016). She received 2 cycles of decarbazine (11/28/2016 - 12/25/2016).  She enrolled on the ALLIANCE phase II study(pazopanib vs. GMW1027) at [facility] on 02/10/2017. She randomized to OZD6644. Cycle #1 began on03/07/2017.   Chest, abdomen, and pelvic CTon05/02/2017 revealedprogressive disease. There were new and enlarging confluent lung metastasis, enlarging pancreatic neck mass, new tumor thrombus in the main portal vein, new and enlarging liver metastasis, enlarging right adrenal metastasis, new right paracolic gutter metastatic implants, and enlarging body wall metastatic implants (right gluteal region and left lateral abdominal wall).  [facility] on [facility]2017.  Pain medications were adjusted.  She began pazopanib on 05/30/2017.  She was last seen in the medical oncology clinic on  06/11/2017.  The evening before she had had 2 episodes of nausea and vomiting.  Pazopanib dose was decreased.  Pain was well controlled.  The patient presented on 06/14/2017 with shortness of breath, cough, and increasing weakness.  She had chills and felt achy.  She was dehydrated.  She received IVF.  Cultures were obtained.  She was empirically started on Zosyn and vancomycin.  Chest CT angiogram on 06/04/2017 revealed no evidence for acute pulmonary embolus.  There was extensive pulmonary metastases.   The dominant mass within the left upper lobe had increased in size in the interval.  Upper abdominal metastasis including liver, right adrenal gland and upper abdominal nodal metastasis have also progressed. Nodal mass within the upper abdomen encase the celiac artery and its branches.  There was tumor thrombus within the left upper lobe pulmonary vein.  Prognosis is poor with limited treatment options, patient and family face advanced directive decisions and anticipatory care needs  Clinical Assessment and Goals of Care:  This NP Wadie Lessen reviewed medical records, received report from team, assessed the patient and then meet at the patient's bedside along with her husband  to discuss diagnosis, prognosis, GOC, EOL wishes disposition and options.  A  discussion was had today regarding advanced directives.  Concepts specific to code status, artifical feeding and hydration, continued IV antibiotics and rehospitalization was had.  The difference between a aggressive medical intervention path  and a  palliative comfort care path for this patient at this time was had.  Values and goals of care important to patient and family were attempted to be elicited.  Husband understands limited prognosis, however at this time he wishes to continue current medical interventions and take "it one day at a time".  Discussed anything could happen at any time, he is open to a hospice facility "it it comes to that.    It would be difficult for EOL care in the home , he and his son ( 46 yo and lives at home) work full time jobs.  MOST form introduced.  Hard Choices booklet left for review  Concept of Hospice and Palliative Care were discussed  Natural trajectory and expectations at EOL were discussed.  Questions and concerns addressed.   Family encouraged to call with questions or concerns.  PMT will continue to support holistically.   NEXT OF KIN/husband is her main decision maker    SUMMARY OF RECOMMENDATIONS    Code Status/Advance Care Planning:  DNR   Symptom Management:   Pain: Continue home OxyContin 20 mg po every 12 hrs                                          Reduce Oxycodone to 5 mg po prn every 6 hrs (husbands                                                         request, making her too sleepy)  Tussionex to prn                                           Palliative Prophylaxis:   Aspiration, Bowel Regimen, Delirium Protocol, Frequent Pain Assessment and Oral Care  Additional Recommendations (Limitations, Scope, Preferences):  Full Scope Treatment-family remains hopeful for improvement   Psycho-social/Spiritual:   Desire for further Chaplaincy support:no  Additional Recommendations: Education on Hospice  Prognosis:   < 4 weeks  Discharge Planning: To Be Determined      Primary Diagnoses: Present on Admission: . Sepsis ([place])   I have reviewed the medical record, interviewed the patient and family, and examined the patient. The following aspects are pertinent.  Past Medical History:  Diagnosis Date  . Breast lump 2006    both breast   . Cancer ([place])    Uterine Leiomyosarcoma  . Chest wall mass   . DVT (deep venous thrombosis) ([facility])    Left leg  . Uterine cancer ([place]) 01/2015   chemo   Social History   Social History  . Marital status: Married    Spouse name: N/A  . Number of children: N/A  . Years of education: N/A   Social History Main Topics    . Smoking status: Never Smoker  . Smokeless tobacco: Never Used  . Alcohol use No  . Drug use: No  . Sexual activity: Not Asked   Other Topics Concern  . None   Social History Narrative  . None   Family History  Problem Relation Age of Onset  . Colon polyps Mother   .  Lupus Father   . Esophageal cancer Maternal Uncle   . Breast cancer Sister 55       fraternal twin sister  . Breast cancer Maternal Aunt   . Breast cancer Maternal Grandmother 60  . Stomach cancer Maternal Aunt   . Lung cancer Maternal Grandfather   . Cancer - Colon Cousin 40       first cousin   Scheduled Meds: . chlorpheniramine-HYDROcodone  5 mL Oral Q12H  . feeding supplement (ENSURE ENLIVE)  237 mL Oral TID BM  . ferrous sulfate  325 mg Oral BID WC  . gabapentin  300 mg Oral TID  . megestrol  200 mg Oral Daily  . metoprolol tartrate  25 mg Oral BID  . oxyCODONE  20 mg Oral Q12H  . potassium chloride  10 mEq Oral Daily  . rivaroxaban  20 mg Oral Q supper  . senna-docusate  3 tablet Oral QHS   Continuous Infusions: . piperacillin-tazobactam (ZOSYN)  IV 3.375 g (06/16/17 0535)  . vancomycin Stopped (06/15/17 1908)   PRN Meds:.acetaminophen, docusate sodium, lidocaine-prilocaine, magnesium hydroxide, naLOXone (NARCAN)  injection, ondansetron (ZOFRAN) IV, oxyCODONE, prochlorperazine Medications Prior to Admission:  Prior to Admission medications   Medication Sig Start Date End Date Taking? Authorizing Provider  docusate sodium (COLACE) 100 MG capsule Take 200 mg by mouth daily as needed.    Yes [provider]  gabapentin (NEURONTIN) 300 MG capsule Take 300 mg by mouth 3 (three) times daily. 05/06/17  Yes [provider]  lidocaine-prilocaine (EMLA) cream Apply 1 application topically as needed. Apply one hour prior to chemotherapy 05/01/15  Yes Lequita Asal, MD  magnesium citrate SOLN Take 1 Bottle by mouth once.   Yes [provider]  magnesium hydroxide (MILK OF  MAGNESIA) 400 MG/5ML suspension Take 15 mLs by mouth daily as needed for mild constipation.   Yes [provider]  megestrol (MEGACE) 400 MG/10ML suspension Take 5 mLs (200 mg total) by mouth daily. 05/29/17  Yes Corcoran, Drue Second, MD  oxyCODONE (OXY IR/ROXICODONE) 5 MG immediate release tablet Take 1-2 tablets (5-10 mg total) by mouth every 4 (four) hours as needed. 06/11/17  Yes Corcoran, Drue Second, MD  OXYCONTIN 10 MG 12 hr tablet Take 20 mg by mouth every 12 (twelve) hours.  04/30/17  Yes [provider]  potassium chloride (KLOR-CON 10) 10 MEQ tablet Take 1 tablet (10 mEq total) by mouth daily. Patient taking differently: Take 20 mEq by mouth daily.  01/19/17  Yes Lequita Asal, MD  rivaroxaban (XARELTO) 20 MG TABS tablet Take 1 tablet by mouth  daily with supper 01/16/17  Yes Corcoran, Melissa C, MD  senna-docusate (SENOKOT-S) 8.6-50 MG tablet Take 3 tablets by mouth at bedtime.   Yes [provider]  VOTRIENT 200 MG tablet Take 400 mg by mouth daily.  02/26/17  Yes [provider]  dexamethasone (DECADRON) 4 MG tablet Take 2 tablets by mouth once a day on the day after chemotherapy and then take 2 tablets two times a day for 2 days. Take with food. Patient not taking: Reported on 05/26/2017 11/28/16   Lequita Asal, MD  ferrous sulfate 325 (65 FE) MG tablet Take 1 tablet (325 mg total) by mouth 2 (two) times daily with a meal. Patient not taking: Reported on 05/26/2017 07/23/16   Loletha Grayer, MD  HYDROmorphone (DILAUDID) 2 MG tablet Take 1 tablet (2 mg total) by mouth every 12 (twelve) hours as needed for severe pain.  Patient not taking: Reported on 06/06/2017 05/26/17 05/26/18  Carrie Mew, MD  oxyCODONE-acetaminophen (PERCOCET/ROXICET) 5-325 MG tablet Take 1 tablet by mouth every 6 (six) hours as needed for severe pain. Patient not taking: Reported on 05/26/2017 02/26/17   Sindy Guadeloupe, MD  prochlorperazine (COMPAZINE) 10 MG tablet Take 1 tablet  (10 mg total) by mouth every 6 (six) hours as needed (Nausea or vomiting). Patient not taking: Reported on 12/25/2016 11/28/16   Lequita Asal, MD  promethazine (PHENERGAN) 12.5 MG tablet Take 1 tablet (12.5 mg total) by mouth every 6 (six) hours as needed for nausea or vomiting. Patient not taking: Reported on 05/26/2017 03/25/17   Gregor Hams, MD   Allergies  Allergen Reactions  . Pravastatin Hives and Nausea And Vomiting  . Sulfa Antibiotics Hives and Swelling  . Latex Rash  . Other Rash and Other (See Comments)    Pt states that she is allergic to spandex.   Review of Systems  Unable to perform ROS: Mental status change    Physical Exam  Constitutional: She appears lethargic. She appears cachectic. She appears ill.  Cardiovascular: Tachycardia present.   Pulmonary/Chest: She has decreased breath sounds in the right lower field and the left lower field.  Neurological: She appears lethargic.  Skin: Skin is dry.  - feet cool , toes mottled     Vital Signs: BP 99/71   Pulse (!) 113   Temp 99.2 F (37.3 C) (Oral)   Resp 17   Ht 5\' 4"  (1.626 m)   Wt 48.5 kg (107 lb)   SpO2 90%   BMI 18.37 kg/m  Pain Assessment: No/denies pain   Pain Score: Asleep   SpO2: SpO2: 90 % O2 Device:SpO2: 90 % O2 Flow Rate: .O2 Flow Rate (L/min): 2 L/min  IO: Intake/output summary:  Intake/Output Summary (Last 24 hours) at 06/16/17 0709 Last data filed at 06/15/17 2000  Gross per 24 hour  Intake                0 ml  Output              600 ml  Net             -600 ml    LBM: Last BM Date: 06/14/17 Baseline Weight: Weight: 48.5 kg (107 lb) Most recent weight: Weight: 48.5 kg (107 lb)     Palliative Assessment/Data: 30 % at best   Discussed with Dr Anselm Jungling  PMT will f/u in the morning and continue discussion with Mr Trabert  Time In: 0645 Time Out: 0800 Time Total: 75 min Greater than 50%  of this time was spent counseling and coordinating care related to the above  assessment and plan.  Signed by: Wadie Lessen, NP   Please contact Palliative Medicine Team phone at [phone] for questions and concerns.  For individual provider: See Shea Evans

## 2017-06-16 NOTE — Progress Notes (Signed)
BP reported at 74/52 and 74/59. Manual reading 80/60. MD Marcille Blanco made aware. 1000 ml NS bolus ordered. BP after 500 ml 99/71. Will continue to monitor

## 2017-06-17 LAB — CBC
HCT: 28.2 % — ABNORMAL LOW (ref 35.0–47.0)
Hemoglobin: 9 g/dL — ABNORMAL LOW (ref 12.0–16.0)
MCH: 27.6 pg (ref 26.0–34.0)
MCHC: 32 g/dL (ref 32.0–36.0)
MCV: 86.3 fL (ref 80.0–100.0)
PLATELETS: 81 10*3/uL — AB (ref 150–440)
RBC: 3.27 MIL/uL — AB (ref 3.80–5.20)
RDW: 20.1 % — ABNORMAL HIGH (ref 11.5–14.5)
WBC: 16.9 10*3/uL — ABNORMAL HIGH (ref 3.6–11.0)

## 2017-06-17 LAB — BASIC METABOLIC PANEL
Anion gap: 13 (ref 5–15)
BUN: 51 mg/dL — AB (ref 6–20)
CO2: 20 mmol/L — ABNORMAL LOW (ref 22–32)
CREATININE: 2.78 mg/dL — AB (ref 0.44–1.00)
Calcium: 7.6 mg/dL — ABNORMAL LOW (ref 8.9–10.3)
Chloride: 106 mmol/L (ref 101–111)
GFR calc Af Amer: 21 mL/min — ABNORMAL LOW (ref >60)
GFR calc non Af Amer: 18 mL/min — ABNORMAL LOW (ref >60)
Glucose, Bld: 87 mg/dL (ref 65–99)
POTASSIUM: 4.6 mmol/L (ref 3.5–5.1)
SODIUM: 139 mmol/L (ref 135–145)

## 2017-06-17 MED ORDER — MORPHINE SULFATE (CONCENTRATE) 10 MG/0.5ML PO SOLN
5.0000 mg | ORAL | Status: DC | PRN
  Administered 2017-06-17: 5 mg via ORAL
  Filled 2017-06-17: qty 1

## 2017-06-17 MED ORDER — MORPHINE SULFATE (PF) 2 MG/ML IV SOLN
2.0000 mg | INTRAVENOUS | Status: DC | PRN
  Administered 2017-06-17 – 2017-06-18 (×3): 2 mg via INTRAVENOUS
  Filled 2017-06-17 (×3): qty 1

## 2017-06-17 MED ORDER — GABAPENTIN 100 MG PO CAPS: 200.0000 mg | ORAL_CAPSULE | Freq: Every day | ORAL | Status: DC

## 2017-06-17 MED ORDER — SODIUM CHLORIDE 0.9 % IV SOLN
INTRAVENOUS | Status: DC
  Administered 2017-06-17 – 2017-06-18 (×2): via INTRAVENOUS

## 2017-06-17 MED ORDER — PIPERACILLIN-TAZOBACTAM 3.375 G IVPB
3.3750 g | Freq: Two times a day (BID) | INTRAVENOUS | Status: DC
  Administered 2017-06-17: 3.375 g via INTRAVENOUS
  Filled 2017-06-17: qty 50

## 2017-06-17 MED ORDER — SODIUM CHLORIDE 0.9 % IV BOLUS (SEPSIS)
1000.0000 mL | Freq: Once | INTRAVENOUS | Status: AC
  Administered 2017-06-17: 1000 mL via INTRAVENOUS

## 2017-06-17 NOTE — Progress Notes (Signed)
Pt received Liter bolus of NS maintence  fluids NS @ 7/hr. Patient voided x 1 time today. Patient bladder scanned twice, 1500 and  1800. No residual scanned.

## 2017-06-17 NOTE — Plan of Care (Signed)
Problem: Fluid Volume: Goal: Hemodynamic stability will improve Outcome: Progressing Patient will remain hemodynamically stable while in the hospital. Patient to receive IVF to maintain hemodynamics.  Problem: Education: Goal: Knowledge of [facility] Education information/materials will improve Outcome: Progressing Patient and family will be updated with the plan of care daily. Will address of questions and concerns.  Problem: Pain Managment: Goal: General experience of comfort will improve Outcome: Progressing Patient pain will be managed as per MD orders. Patient will be turned q 2 hours or PRN/.

## 2017-06-17 NOTE — Progress Notes (Signed)
[place] at [place] NAME: Cassidy Bradley    MR#:  [id number]  DATE OF BIRTH:  1961/12/10  SUBJECTIVE:  CHIEF COMPLAINT:   Chief Complaint  Patient presents with  . Code Sepsis  . Shortness of Breath   Patient is not talking. Drowsy. Has been tachycardic and hypotensive.  REVIEW OF SYSTEMS:  Review of Systems  Unable to perform ROS: Critical illness    DRUG ALLERGIES:   Allergies  Allergen Reactions  . Pravastatin Hives and Nausea And Vomiting  . Sulfa Antibiotics Hives and Swelling  . Latex Rash  . Other Rash and Other (See Comments)    Pt states that she is allergic to spandex.    VITALS:  Blood pressure 114/80, pulse (!) 51, temperature 97.6 F (36.4 C), temperature source Oral, resp. rate 16, height 5\' 4"  (1.626 m), weight 48.5 kg (107 lb), SpO2 91 %.  PHYSICAL EXAMINATION:  Physical Exam  GENERAL:  56 y.o.-year-old ill nourished patient lying in the bed , Picking at it. Looks critically ill.  EYES: Pupils equal, round, reactive to light and accommodation. HEENT: Head atraumatic, normocephalic. Oropharynx and nasopharynx clear.  NECK:  Supple, no jugular venous distention. No thyroid enlargement, no tenderness.  LUNGS: Normal breath sounds bilaterally,Scattered rhonchi in the upper lobes, no wheezing, rales or crepitation. No use of accessory muscles of respiration.  CARDIOVASCULAR: S1, S2 normal. No  rubs, or gallops. 2/6 systolic murmur is present ABDOMEN: Soft, nontender, nondistended. Bowel sounds present. No organomegaly or mass.  EXTREMITIES: No pedal edema, cyanosis, or clubbing.  PSYCHIATRIC: The patient is drowsy SKIN:  mottling in hands and feet   LABORATORY PANEL:   CBC  Recent Labs Lab 06/17/17 0442  WBC 16.9*  HGB 9.0*  HCT 28.2*  PLT 81*   ------------------------------------------------------------------------------------------------------------------  Chemistries   Recent Labs Lab  06/22/2017 2036  06/17/17 0442  NA 131*  < > 139  K 4.0  < > 4.6  CL 98*  < > 106  CO2 22  < > 20*  GLUCOSE 96  < > 87  BUN 12  < > 51*  CREATININE 0.64  < > 2.78*  CALCIUM 8.5*  < > 7.6*  AST 118*  --   --   ALT 31  --   --   ALKPHOS 320*  --   --   BILITOT 0.9  --   --   < > = values in this interval not displayed. ------------------------------------------------------------------------------------------------------------------  Cardiac Enzymes  Recent Labs Lab 06/07/2017 1324  TROPONINI 0.04*   ------------------------------------------------------------------------------------------------------------------  RADIOLOGY:  No results found.  EKG:   Orders placed or performed in visit on 06/05/2017  . EKG 12-Lead    ASSESSMENT AND PLAN:   56 y/o stage 4 uterine leiomyoma with pulmonary and liver mets, malnourishment came with worsening shortness of breath and fever  #1 sepsis Of unknown source. On broad-spectrum antibiotics. Cultures are negative. Patient seems to be worsening with associated acute renal failure from ATN. will bolus normal saline stat. Start maintenance fluids.  #2 Chronic dyspnea-secondary to worsening pulmonary metastases -Reviewed CT chest  #3 metastatic uterine Leiomyoma with lung metastases and liver metastases-progressive disease in spite of multiple chemotherapies Appreciate oncology input. Poor prognosis  #4 severe protein caloriemalnutrition-dietary consult and supplements. Also on Megace  #5 history of DVT-on Xarelto Stop today due to thrombocytopenia  Patient is critically ill. Worsening status in spite of aggressive treatment at this time. She has mottling  in her hands and feet. Will benefit from comfort measures at this point. Called husband and left voicemail.  All the records are reviewed and case discussed with Care Management/Social Worker Management plans discussed with the patient, family and they are in agreement.  CODE STATUS:  DNR  TOTAL CC TIME TAKING CARE OF THIS PATIENT:50 minutes.   [name] R M.D on 06/17/2017 at 1:48 PM  Between 7am to 6pm - Pager - [phone]  After 6pm go to [email or web address] - password [email or web address] [facility]  Office  [phone]  CC: Primary care physician; Maryland Pink, MD

## 2017-06-17 NOTE — Progress Notes (Signed)
Called patient's husband again and was able to get through. Discussed with him regarding worsening status. Developing acute renal failure and signs of end of life. He mentions that he has been told by his wife that she would prioritize comfort. I have advised him that at this point even with aggressive treatment or no treatment patient likely has same amount of time left and comfort measures would help. He has also heard the same from [facility] with palliative care. He does want to talk to wife's family to make sure they all agree and then decide on comfort measures.

## 2017-06-17 NOTE — Progress Notes (Signed)
Patient ID: MARIEANNE MARXEN, female   DOB: 03-25-1961, 56 y.o.   MRN: [id number]  This NP visited patient at the bedside as a follow up to  yesterday's [place].  Placed call  to husband by phone to update on condition.     Shared her notable BLE mottling as well as hands and nose, minimal responsiveness and decreased renal function, and belief that she is transitioning at EOL.  Anything could happen at any time and prognosis is likely hours to  days.    Her husband requested to speak with Dr Mike Gip and attending , I placed a call to Dr Mike Gip in [place] office.    Per Dr Kem Parkinson nurse Mr Sassi understands the limited prognosis but his hesitation to shift  to a full comfort path is that he cannot get FMLA approved  from work to be here. Requested that I help facilitate Mr Coburn presence at this wife's bedside.  I call husband and offered to speak with supervisor or to construct written request but Mr Haydu declined and told me he would speak to his supervisor again himself.  He also shared with me that his hesitation also is impacted by his wife's family "to do everything."  I offered to meet with other family members at their convenience; he will let me know.  For now continue with current medical interventions, no shift to full comfort.  Emotional support offered to husband.   PMT will continue to support holistically  Questions and concerns addressed  Time in   1125        Time out  1200      35 min   Total time spent on the unit was  35 min    Discussed with Dr Darvin Neighbours  Greater than 50% of the time was spent in counseling and coordination of care  Wadie Lessen NP  Palliative Medicine Team Team Phone # [phone] Pager [phone]

## 2017-06-17 NOTE — Progress Notes (Signed)
Chaplain visited with patient in room 242. Provided pastoral care and spiritual presence.    06/17/17 1330  Clinical Encounter Type  Visited With Patient  Visit Type Initial;Spiritual support  Referral From Nurse  Consult/Referral To Chaplain  Spiritual Encounters  Spiritual Needs Other (Comment)

## 2017-06-18 DIAGNOSIS — R627 Adult failure to thrive: Secondary | ICD-10-CM

## 2017-06-18 LAB — CULTURE, BLOOD (ROUTINE X 2)
Culture: NO GROWTH
Culture: NO GROWTH
SPECIAL REQUESTS: ADEQUATE
Special Requests: ADEQUATE

## 2017-06-18 MED ORDER — MORPHINE 100MG IN NS 100ML (1MG/ML) PREMIX INFUSION
2.0000 mg/h | INTRAVENOUS | Status: DC
  Administered 2017-06-18: 1 mg/h via INTRAVENOUS
  Filled 2017-06-18: qty 100

## 2017-06-18 MED ORDER — MORPHINE SULFATE 25 MG/ML IV SOLN: 1.0000 mg/h | INTRAVENOUS | Status: DC

## 2017-06-18 NOTE — Progress Notes (Signed)
[place] at [place] NAME: Cassidy Bradley    MR#:  [id number]  DATE OF BIRTH:  Jan 04, 1961  SUBJECTIVE:  CHIEF COMPLAINT:   Chief Complaint  Patient presents with  . Code Sepsis  . Shortness of Breath   Patient is not talking. Drowsy. Has been tachycardic and hypotensive.Family members at bedside.  REVIEW OF SYSTEMS:  Review of Systems  Unable to perform ROS: Critical illness    DRUG ALLERGIES:   Allergies  Allergen Reactions  . Pravastatin Hives and Nausea And Vomiting  . Sulfa Antibiotics Hives and Swelling  . Latex Rash  . Other Rash and Other (See Comments)    Pt states that she is allergic to spandex.    VITALS:  Blood pressure (!) 66/51, pulse (!) 131, temperature 97.7 F (36.5 C), temperature source Oral, resp. rate 18, height 5\' 4"  (1.626 m), weight 48.5 kg (107 lb), SpO2 (!) 89 %.  PHYSICAL EXAMINATION:  Physical Exam  GENERAL:  56 y.o.-year-old ill nourished patient lying in the bed , Picking at it. Looks critically ill.  HEENT: Head atraumatic, normocephalic. Oropharynx and nasopharynx clear.  NECK:  Supple, no jugular venous distention. No thyroid enlargement, no tenderness.  LUNGS: Diminished breath sounds bilaterally,Scattered rhonchi in the upper lobes, no wheezing, rales or crepitation. No use of accessory muscles of respiration.  CARDIOVASCULAR: S1, S2 normal. No  rubs, or gallops. 2/6 systolic murmur is present ABDOMEN: Soft, nontender, nondistended. Bowel sounds present. No organomegaly or mass.  SKIN:  mottling in hands and feet   LABORATORY PANEL:   CBC  Recent Labs Lab 06/17/17 0442  WBC 16.9*  HGB 9.0*  HCT 28.2*  PLT 81*   ------------------------------------------------------------------------------------------------------------------  Chemistries   Recent Labs Lab 06/02/2017 2036  06/17/17 0442  NA 131*  < > 139  K 4.0  < > 4.6  CL 98*  < > 106  CO2 22  < > 20*  GLUCOSE 96  < >  87  BUN 12  < > 51*  CREATININE 0.64  < > 2.78*  CALCIUM 8.5*  < > 7.6*  AST 118*  --   --   ALT 31  --   --   ALKPHOS 320*  --   --   BILITOT 0.9  --   --   < > = values in this interval not displayed. ------------------------------------------------------------------------------------------------------------------  Cardiac Enzymes  Recent Labs Lab 06/21/2017 1324  TROPONINI 0.04*   ------------------------------------------------------------------------------------------------------------------  RADIOLOGY:  No results found.  EKG:   Orders placed or performed in visit on 06/14/2017  . EKG 12-Lead    ASSESSMENT AND PLAN:   56 y/o stage 4 uterine leiomyoma with pulmonary and liver mets, malnourishment came with worsening shortness of breath and fever  #1 sepsis Of unknown source.  #2 Chronic dyspnea-secondary to worsening pulmonary metastases -Reviewed CT chest  #3 metastatic uterine Leiomyoma with lung metastases and liver metastases-progressive disease in spite of multiple chemotherapies   #4 severe protein caloriemalnutrition-  #5 history of DVT-  Patient is critically ill. Worsening status in spite of aggressive treatment at this time. She has mottling in her hands and feet.Palliative care consulted, after discussing with family members today patient was changed to comfort care measures Morphine drip for comfort care, Foley catheter for comfort Transfer patient to 1c Very poor prognosis  All the records are reviewed and case discussed with Care Management/Social Worker Management plans discussed with the  family and they are  in agreement.  CODE STATUS: DNR  TOTAL TIME TAKING CARE OF THIS PATIENT:52minutes.   Nicholes Mango M.D on 06/18/2017 at 12:27 PM  Between 7am to 6pm - Pager - [phone]   After 6pm go to [email or web address] - password [email or web address] [place] [facility]  Office  [phone]  CC: Primary care physician; Maryland Pink, MD

## 2017-06-18 NOTE — Progress Notes (Signed)
Daily Progress Note   Patient Name: Cassidy Bradley       Date: 06/18/2017 DOB: 1961-01-13  Age: 56 y.o. MRN#: [id number] Attending Physician: Nicholes Mango, MD Primary Care Physician: [place] Pink, MD Admit Date: 06/27/2017  Reason for Consultation/Follow-up: Establishing goals of care, Non pain symptom management, Pain control and Psychosocial/spiritual support  Subjective:  This NP Wadie Lessen meet at the patient's bedside along with her husband, son, two s sisters/Diane and mother to discuss diagnosis, prognosis, GOC, EOL wishes disposition and options.  Concept of Hospice and Palliative Care were discussed  A  discussion was had today regarding concepts specific to code status, artifical  hydration, continued IV antibiotics  was had.  The difference between a aggressive medical intervention path  and a palliative comfort care path for this patient at this time was had.  Values and goals of care important to patient and family were attempted to be elicited.  Shared belief that prognosis sis hours to days.  Natural trajectory and expectations at EOL were discussed.     All family members verbalize understanding and agreement with a full comfort path.  Questions and concerns addressed.   Family encouraged to call with questions or concerns.  PMT will continue to support holistically.   Length of Stay: 5  Current Medications: Scheduled Meds:  . feeding supplement (ENSURE ENLIVE)  237 mL Oral TID BM  . ferrous sulfate  325 mg Oral BID WC  . gabapentin  200 mg Oral QHS  . megestrol  200 mg Oral Daily  . oxyCODONE  20 mg Oral Q12H  . potassium chloride  10 mEq Oral Daily  . senna-docusate  3 tablet Oral QHS    Continuous Infusions: . sodium chloride 75 mL/hr at 06/18/17 0139    . piperacillin-tazobactam (ZOSYN)  IV Stopped (06/18/17 0122)    PRN Meds: acetaminophen, chlorpheniramine-HYDROcodone, docusate sodium, lidocaine-prilocaine, magnesium hydroxide, morphine injection, morphine CONCENTRATE, naLOXone (NARCAN)  injection, ondansetron (ZOFRAN) IV, oxyCODONE, prochlorperazine  Physical Exam  Constitutional: She appears cachectic. She appears ill. Nasal cannula in place.  Cardiovascular: Tachycardia present.   Pulmonary/Chest: She has decreased breath sounds in the right lower field and the left lower field.  Neurological: She is unresponsive.  Skin:  - gross mottling BLE, fingers and nose  Vital Signs: BP (!) 66/51 (BP Location: Left Arm)   Pulse (!) 131   Temp 97.7 F (36.5 C) (Oral)   Resp 18   Ht 5\' 4"  (1.626 m)   Wt 48.5 kg (107 lb)   SpO2 (!) 89%   BMI 18.37 kg/m  SpO2: SpO2: (!) 89 % O2 Device: O2 Device: Nasal Cannula O2 Flow Rate: O2 Flow Rate (L/min): 2 L/min  Intake/output summary:  Intake/Output Summary (Last 24 hours) at 06/18/17 0900 Last data filed at 06/18/17 9622  Gross per 24 hour  Intake              875 ml  Output                0 ml  Net              875 ml   LBM: Last BM Date: 06/14/17 Baseline Weight: Weight: 48.5 kg (107 lb) Most recent weight: Weight: 48.5 kg (107 lb)       Palliative Assessment/Data: 10 %    Flowsheet Rows     Most Recent Value  Intake Tab  Referral Department  Hospitalist  Unit at Time of Referral  Cardiac/Telemetry Unit  Palliative Care Primary Diagnosis  Cancer  Date Notified  06/15/17  Palliative Care Type  New Palliative care  Reason for referral  Clarify Goals of Care  Date of Admission  06/21/2017  Date first seen by Palliative Care  06/16/17  # of days Palliative referral response time  1 Day(s)  # of days IP prior to Palliative referral  2  Clinical Assessment  Psychosocial & Spiritual Assessment  Palliative Care Outcomes      Patient Active Problem List    Diagnosis Date Noted  . DNR (do not resuscitate) 06/16/2017  . Palliative care by specialist   . Cancer associated pain 06/11/2017  . Counseling regarding goals of care 06/11/2017  . 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 Acute pain of right shoulder 01/12/2017  . Encounter for antineoplastic chemotherapy 01/12/2017  . Hypokalemia 12/07/2016  . Metastatic cancer to liver ([place]) 11/28/2016  . Rhabdomyolysis 07/21/2016  . DVT (deep venous thrombosis) ([place])   . Reflux 08/11/2015  . Drug reaction 05/26/2015  . Acute febrile illness 05/25/2015  . Elevated LFTs 05/25/2015  . Anemia 05/25/2015  . Sepsis ([place]) 05/25/2015  . Anemia of chronic disease 05/25/2015  . Candidiasis of mouth 05/25/2015  . 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 Pulmonary metastasis ([place]) 02/12/2015  . Encounter for screening colonoscopy 08/25/2014  . Allergic rhinitis 08/23/2014  . Hypercholesterolemia 08/23/2014  . Climacteric 08/23/2014  . Headache, migraine 08/23/2014  . Lump or mass in breast 06/23/2013  . Essential hypertension, benign 03/30/2013  . Benign essential HTN 03/30/2013  . Benign breast cyst in female 03/29/2013  . 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   Recommendations/Plan:  Shift to full comfort path; no further artificial hydration, diagnostics, antibiotics  Morphine gtt, foley for comfort  Transfer to 1C for larger room   Goals of Care and Additional Recommendations:  Limitations on Scope of Treatment: Full Comfort Care  Code Status:    Code Status Orders        Start     Ordered   06/15/17 1536  Do not attempt resuscitation (DNR)  Continuous    Question Answer  Comment  In the event of cardiac or respiratory ARREST Do not call a "code blue"   In the event of cardiac or respiratory ARREST Do not  perform Intubation, CPR, defibrillation or ACLS   In the event of cardiac or respiratory ARREST Use medication by any route, position, wound care, and other measures to relive pain and suffering. May use oxygen, suction and manual treatment of airway obstruction as needed for comfort.   Comments spoke to her husband      06/15/17 1535    Code Status History    Date Active Date Inactive Code Status Order ID Comments User Context   07/21/2016  3:30 PM 07/23/2016  5:32 PM Full Code [id number]  Gladstone Lighter, MD Inpatient   05/25/2015  2:47 AM 05/26/2015  4:23 PM Full Code [id number]  Juluis Mire, MD Inpatient       Prognosis:   Hours - Days  Discharge Planning:  Anticipated Hospital Death  Care plan was discussed with Dr Mar Daring  Thank you for allowing the Palliative Medicine Team to assist in the care of this patient.   Time In: 0845 Time Out:  0930 Total Time 45 min Prolonged Time Billed  no       Greater than 50%  of this time was spent counseling and coordinating care related to the above assessment and plan.  Wadie Lessen, NP  Please contact Palliative Medicine Team phone at [phone] for questions and concerns.

## 2017-06-18 NOTE — Progress Notes (Addendum)
Pt bladder scanned at 0200 and 0530. No residual detected, no output noted so far this shift.

## 2017-06-19 ENCOUNTER — Inpatient Hospital Stay: Payer: 59 | Admitting: Hematology and Oncology

## 2017-06-19 ENCOUNTER — Inpatient Hospital Stay: Payer: 59

## 2017-06-28 NOTE — Discharge Summary (Signed)
Date of admission 06/15/2017 Date of death-07-08-2017  HPI  Cassidy Bradley  is a 56 y.o. 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 Patient is seen by Dr. Mike Gip and getting chemotherapy therapy. Chest x-ray and CT angiogram of the chest are negative but patient was febrile and tachycardic. Patient is started on empiric IV antibiotics after cultures and hospitalist team is called to admit the patient  Hospital course  Patient was admitted with sepsis from unclear source, treated with IV antibiotics with no significant improvement. Patient has uterine Leo myosarcoma stage IV with metastasis Patient was critically ill. Worsening status in spite of aggressive treatment a She has developed  mottling in her hands and feet.Palliative care consulted, after discussing with family members patient was changed to comfort care measures Morphine drip for comfort care, Foley catheter for comfort implemented Patient was deceased on Jul 08, 2017 early  hours, family members at bedside Chaplain services were offered by the staff to family

## 2017-06-28 NOTE — Progress Notes (Addendum)
Was called to assess patient by a family member. Seen patient lying in bed. No visible rise and fall of chest, non responsive and pulses undetectable. Writer and another RN pronounced patient death. Family members at bedside. Nursing Supervisor informed. MD on call informed. [facility] called and has given a full release order. Needs attended. Family refused Chaplain services. Funeral home determined by the husband. Post mortem care provided. Transferred body to the morgue.

## 2017-06-28 DEATH — deceased

## 2017-07-04 ENCOUNTER — Other Ambulatory Visit: Payer: Self-pay | Admitting: Nurse Practitioner

## 2017-07-29 IMAGING — CR DG CHEST 2V
1 series · 2 of 2 positions shown · non-contrast
Comparison: 03/24/2017

CLINICAL DATA: LEFT side chest wall pain and shortness of breath,
history of uterine cancer, pancreatic mass, hypertension, pulmonary
metastatic disease

EXAM:
CHEST  2 VIEW

[Series 1: w chest pa · 0.14mm/px · 2 of 2 slices shown]
[im 1/2]
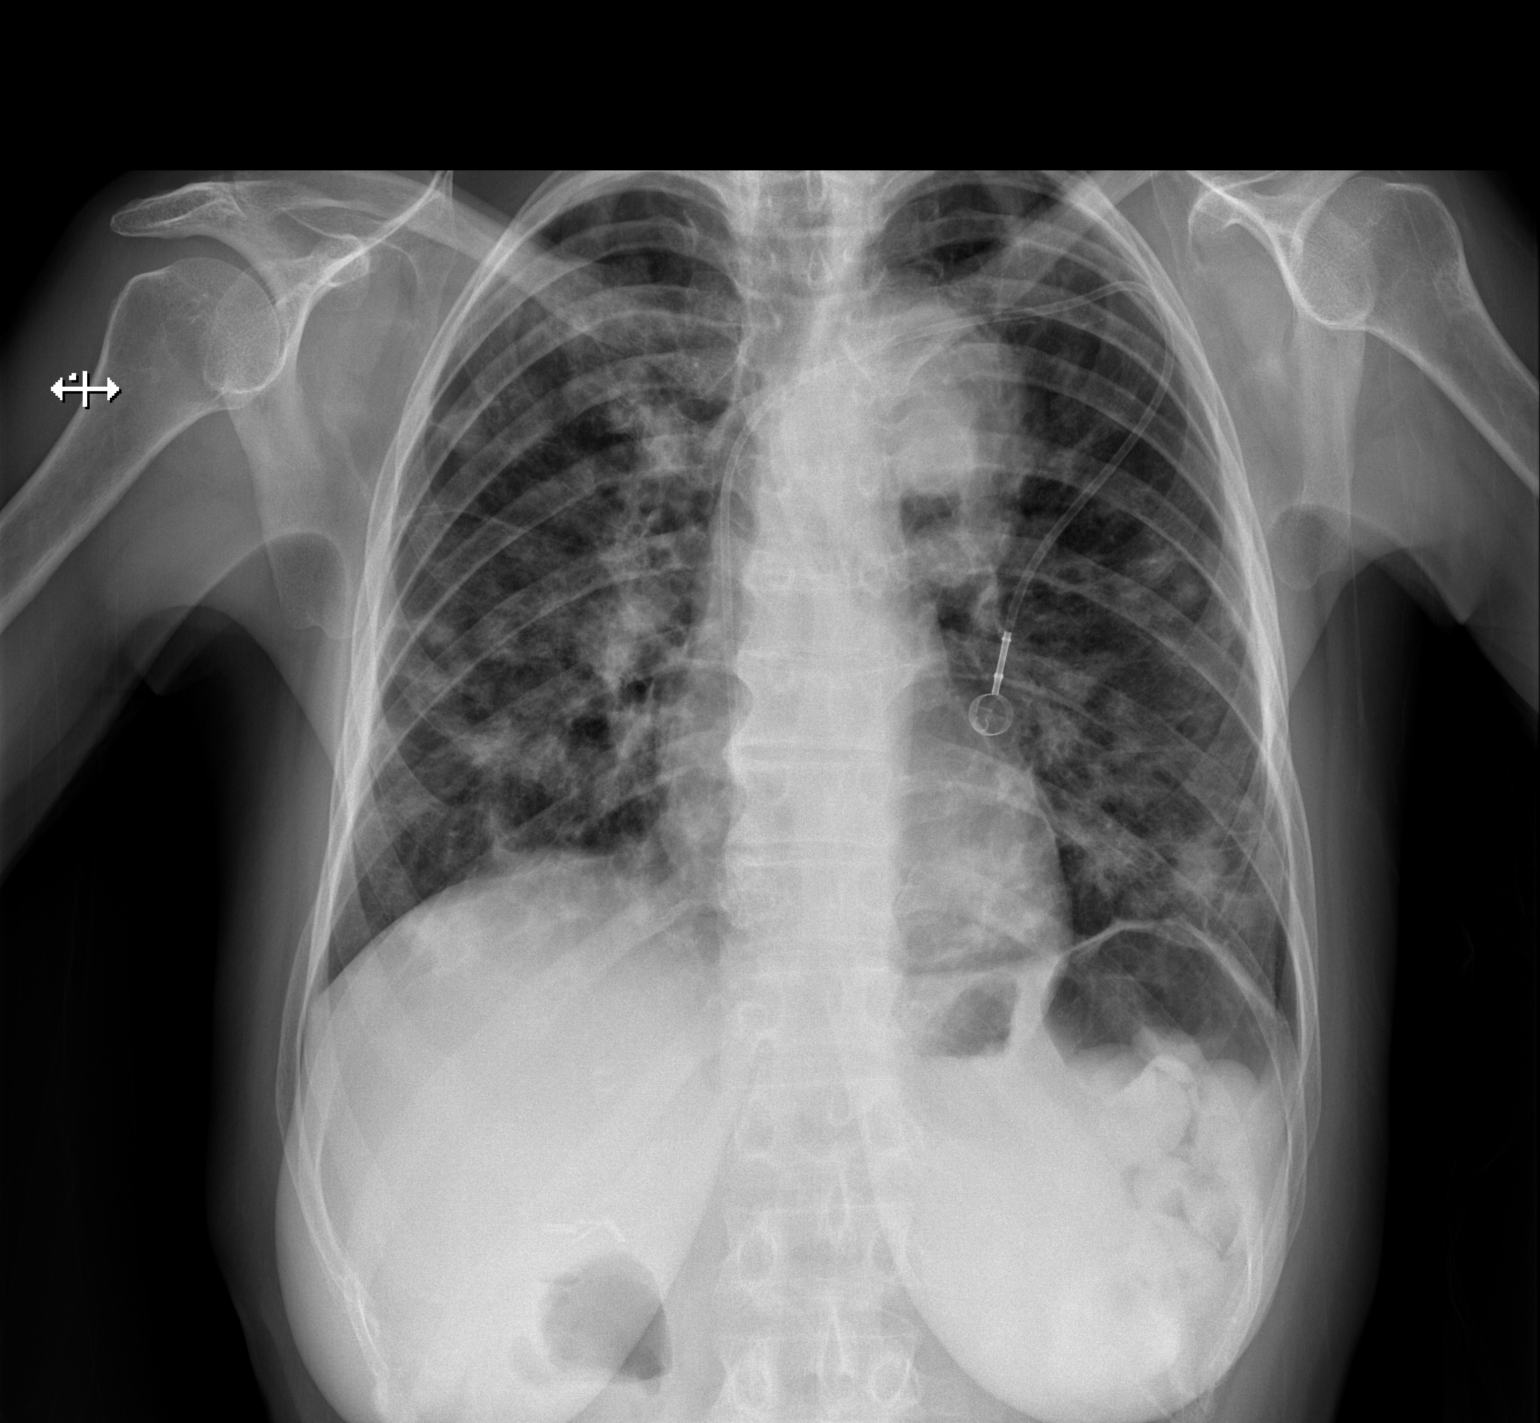
[im 2/2]
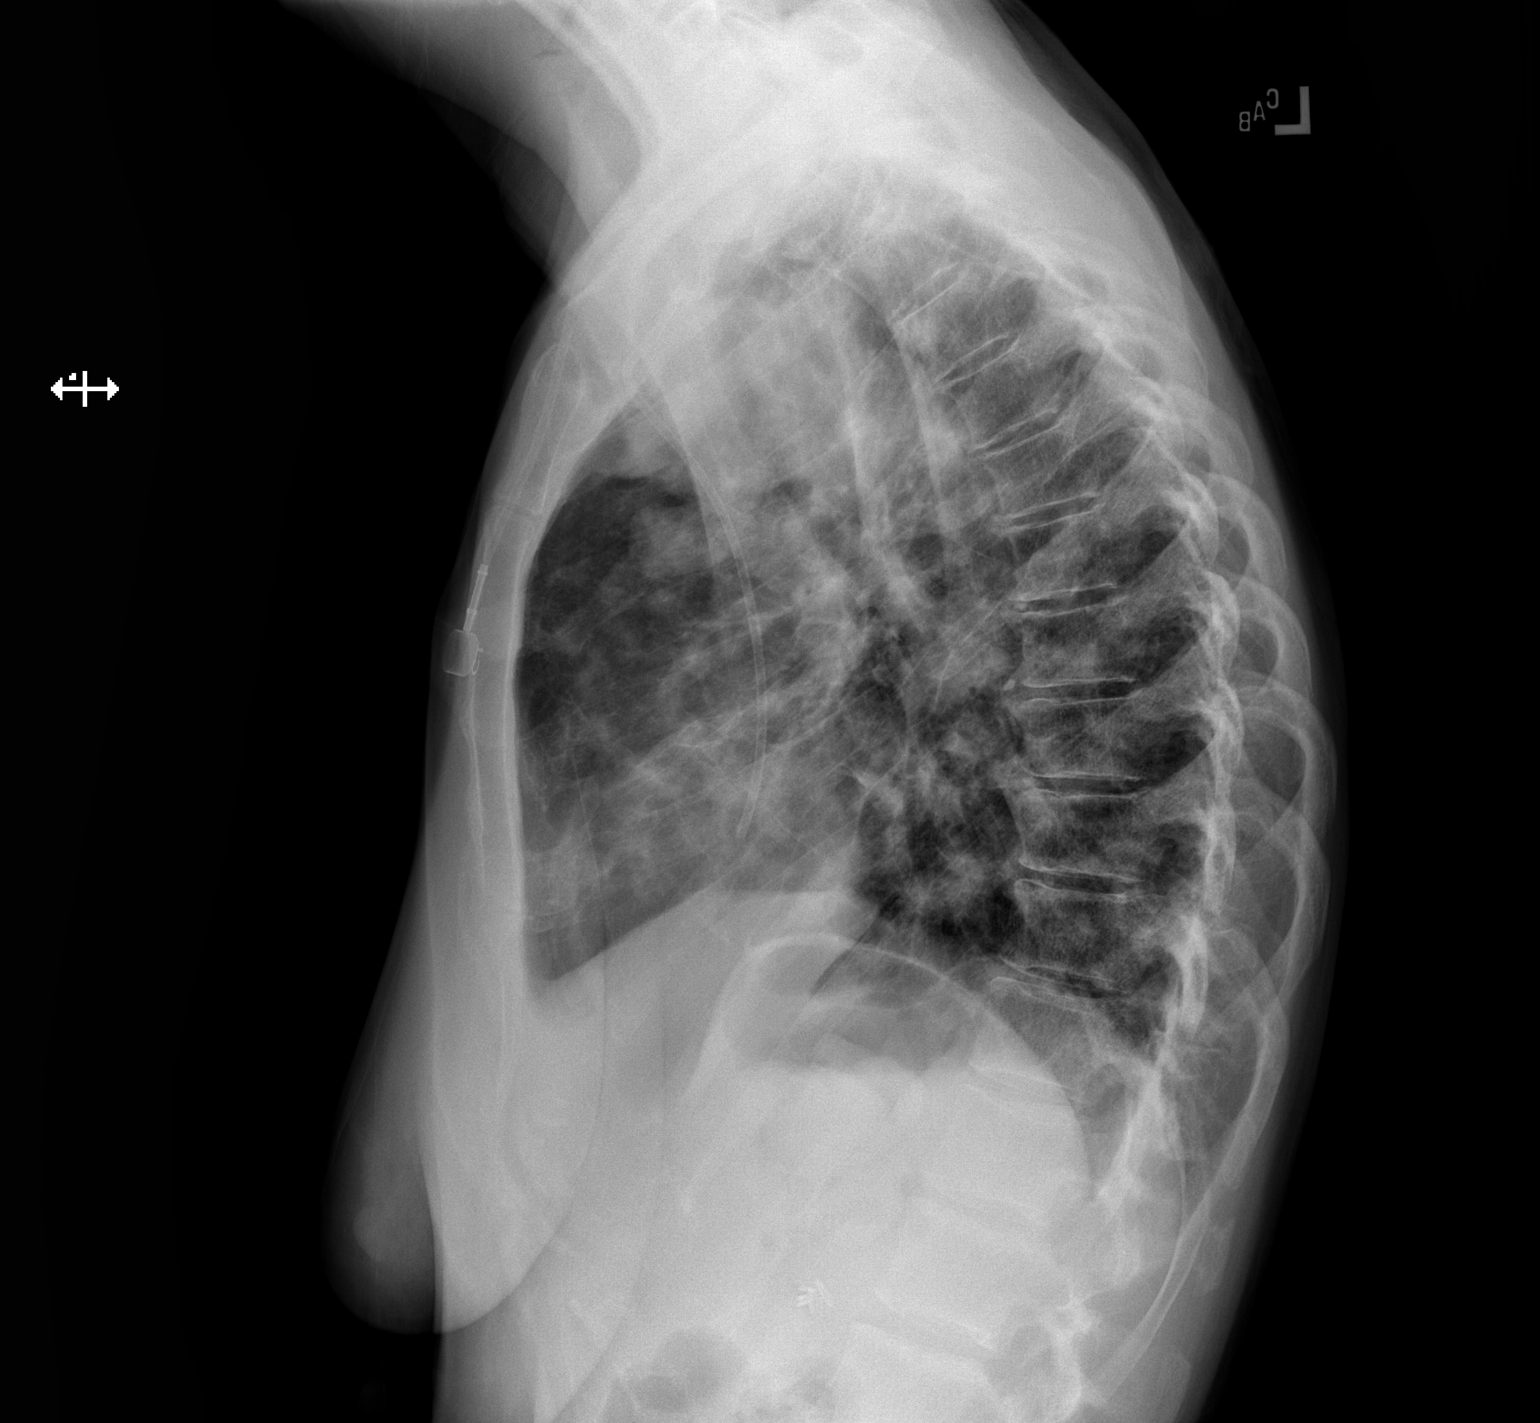

[2 of 2 positions shown; findings below may reference images not displayed]

FINDINGS: LEFT subclavian Port-A-Cath with tip projecting over RIGHT atrium.

Normal heart size and pulmonary vascularity.

Aortic arch obscured by a large medial LEFT upper lobe mass.

Numerous additional pulmonary nodules are seen in both lungs
consistent with metastases, appear increased in sizes and number.

No acute infiltrate, pleural effusion or pneumothorax.

No definite osseous metastases identified.
IMPRESSION: Increased pulmonary metastases.

Minimally increased size of dominant LEFT upper lobe mass adjacent
to the aortic arch.
